# Patient Record
Sex: Female | Born: 1945 | Race: White | Hispanic: No | Marital: Married | Smoking: Former smoker
Health system: Southern US, Community
[De-identification: ages and names within clinical notes are randomized; demographics above are authoritative.]

## PROBLEM LIST (undated history)

## (undated) DIAGNOSIS — G43909 Migraine, unspecified, not intractable, without status migrainosus: Secondary | ICD-10-CM

## (undated) DIAGNOSIS — K8020 Calculus of gallbladder without cholecystitis without obstruction: Secondary | ICD-10-CM

## (undated) DIAGNOSIS — K760 Fatty (change of) liver, not elsewhere classified: Secondary | ICD-10-CM

## (undated) DIAGNOSIS — G4700 Insomnia, unspecified: Secondary | ICD-10-CM

## (undated) DIAGNOSIS — I499 Cardiac arrhythmia, unspecified: Secondary | ICD-10-CM

## (undated) DIAGNOSIS — I10 Essential (primary) hypertension: Secondary | ICD-10-CM

## (undated) DIAGNOSIS — F419 Anxiety disorder, unspecified: Secondary | ICD-10-CM

## (undated) DIAGNOSIS — K219 Gastro-esophageal reflux disease without esophagitis: Secondary | ICD-10-CM

## (undated) DIAGNOSIS — G2581 Restless legs syndrome: Secondary | ICD-10-CM

## (undated) DIAGNOSIS — M5430 Sciatica, unspecified side: Secondary | ICD-10-CM

## (undated) HISTORY — PX: INSERT / REPLACE / REMOVE PACEMAKER: SUR710

## (undated) HISTORY — PX: TUBAL LIGATION: SHX77

## (undated) HISTORY — PX: TONSILLECTOMY: SUR1361

## (undated) HISTORY — PX: COLONOSCOPY: SHX174

---

## 2013-10-15 DIAGNOSIS — I10 Essential (primary) hypertension: Secondary | ICD-10-CM | POA: Insufficient documentation

## 2013-10-15 DIAGNOSIS — E785 Hyperlipidemia, unspecified: Secondary | ICD-10-CM | POA: Insufficient documentation

## 2013-10-15 DIAGNOSIS — M79672 Pain in left foot: Secondary | ICD-10-CM | POA: Insufficient documentation

## 2013-10-15 DIAGNOSIS — Z1211 Encounter for screening for malignant neoplasm of colon: Secondary | ICD-10-CM | POA: Insufficient documentation

## 2013-10-15 DIAGNOSIS — F32A Depression, unspecified: Secondary | ICD-10-CM | POA: Insufficient documentation

## 2013-10-15 DIAGNOSIS — G43909 Migraine, unspecified, not intractable, without status migrainosus: Secondary | ICD-10-CM | POA: Insufficient documentation

## 2013-10-15 DIAGNOSIS — R1013 Epigastric pain: Secondary | ICD-10-CM | POA: Insufficient documentation

## 2013-11-03 DIAGNOSIS — R1310 Dysphagia, unspecified: Secondary | ICD-10-CM | POA: Insufficient documentation

## 2013-11-03 DIAGNOSIS — M7670 Peroneal tendinitis, unspecified leg: Secondary | ICD-10-CM | POA: Insufficient documentation

## 2014-04-13 DIAGNOSIS — M2140 Flat foot [pes planus] (acquired), unspecified foot: Secondary | ICD-10-CM | POA: Insufficient documentation

## 2014-04-13 DIAGNOSIS — M216X9 Other acquired deformities of unspecified foot: Secondary | ICD-10-CM | POA: Insufficient documentation

## 2015-02-24 DIAGNOSIS — M533 Sacrococcygeal disorders, not elsewhere classified: Secondary | ICD-10-CM | POA: Insufficient documentation

## 2015-02-24 DIAGNOSIS — M5136 Other intervertebral disc degeneration, lumbar region: Secondary | ICD-10-CM | POA: Insufficient documentation

## 2015-12-07 DIAGNOSIS — M47817 Spondylosis without myelopathy or radiculopathy, lumbosacral region: Secondary | ICD-10-CM | POA: Insufficient documentation

## 2016-12-13 ENCOUNTER — Emergency Department
Admission: EM | Admit: 2016-12-13 | Discharge: 2016-12-13 | Disposition: A | Discharge status: Home / Self Care | Payer: Medicare Other | Attending: Emergency Medicine | Admitting: Emergency Medicine

## 2016-12-13 ENCOUNTER — Encounter: Payer: Self-pay | Admitting: Emergency Medicine

## 2016-12-13 DIAGNOSIS — I10 Essential (primary) hypertension: Secondary | ICD-10-CM | POA: Diagnosis present

## 2016-12-13 DIAGNOSIS — Z79899 Other long term (current) drug therapy: Secondary | ICD-10-CM | POA: Diagnosis not present

## 2016-12-13 DIAGNOSIS — F17210 Nicotine dependence, cigarettes, uncomplicated: Secondary | ICD-10-CM | POA: Insufficient documentation

## 2016-12-13 HISTORY — DX: Essential (primary) hypertension: I10

## 2016-12-13 HISTORY — DX: Migraine, unspecified, not intractable, without status migrainosus: G43.909

## 2016-12-13 HISTORY — DX: Restless legs syndrome: G25.81

## 2016-12-13 HISTORY — DX: Sciatica, unspecified side: M54.30

## 2016-12-13 LAB — COMPREHENSIVE METABOLIC PANEL
ALBUMIN: 4.1 g/dL (ref 3.5–5.0)
ALK PHOS: 103 U/L (ref 38–126)
ALT: 22 U/L (ref 14–54)
ANION GAP: 6 (ref 5–15)
AST: 24 U/L (ref 15–41)
BILIRUBIN TOTAL: 0.7 mg/dL (ref 0.3–1.2)
BUN: 15 mg/dL (ref 6–20)
CALCIUM: 9.4 mg/dL (ref 8.9–10.3)
CO2: 29 mmol/L (ref 22–32)
Chloride: 104 mmol/L (ref 101–111)
Creatinine, Ser: 0.6 mg/dL (ref 0.44–1.00)
GFR calc non Af Amer: >60 mL/min (ref >60)
GLUCOSE: 156 mg/dL — AB (ref 65–99)
POTASSIUM: 4.1 mmol/L (ref 3.5–5.1)
SODIUM: 139 mmol/L (ref 135–145)
TOTAL PROTEIN: 7 g/dL (ref 6.5–8.1)

## 2016-12-13 LAB — CBC
HCT: 42.8 % (ref 35.0–47.0)
HEMOGLOBIN: 14.3 g/dL (ref 12.0–16.0)
MCH: 29.8 pg (ref 26.0–34.0)
MCHC: 33.3 g/dL (ref 32.0–36.0)
MCV: 89.5 fL (ref 80.0–100.0)
Platelets: 245 10*3/uL (ref 150–440)
RBC: 4.78 MIL/uL (ref 3.80–5.20)
RDW: 13.4 % (ref 11.5–14.5)
WBC: 7.5 10*3/uL (ref 3.6–11.0)

## 2016-12-13 MED ORDER — AMLODIPINE BESYLATE 5 MG PO TABS: 5.0000 mg | ORAL_TABLET | Freq: Every day | ORAL | 0 refills | Status: DC

## 2016-12-13 MED ORDER — DIAZEPAM 5 MG PO TABS: 2.5000 mg | ORAL_TABLET | Freq: Two times a day (BID) | ORAL | 0 refills | Status: DC | PRN

## 2016-12-13 NOTE — ED Provider Notes (Signed)
[facility] Emergency Department Provider Note  ____________________________________________  Time seen: Approximately 3:00 PM  I have reviewed the triage vital signs and the nursing notes.   HISTORY  Chief Complaint Hypertension    HPI Megan Irwin is a 71 y.o. female sent to the ED for evaluation due to elevated blood pressure. Reports at home is about 220/100. This is associated with some increased frequency of her migraine type headaches without any new features. She currently is symptom free. She takes only atenolol 100 mg for blood pressure control. She also used to take Valium 2.5 mg each evening for her anxiety restless leg syndrome and difficulty sleeping, and she felt like her blood pressure control was better with this until she ran out 2 weeks ago. Over these last 2 week she's been having the above mentioned symptoms, intermittent, without aggravating or alleviating factors, moderate intensity.  She has scheduled a new patient appointment with a local primary care doctor but that is not for another 3 weeks.     Past Medical History:  Diagnosis Date  . Hypertension   . Migraines   . Restless leg syndrome   . Sciatica      There are no active problems to display for this patient.    Past Surgical History:  Procedure Laterality Date  . COLONOSCOPY    . TONSILLECTOMY       Prior to Admission medications   Medication Sig Start Date End Date Taking? Authorizing Provider  amLODipine (NORVASC) 5 MG tablet Take 1 tablet (5 mg total) by mouth daily. 12/13/16   Sharman CheekStafford, Anhar Mcdermott, MD  diazepam (VALIUM) 5 MG tablet Take 0.5 tablets (2.5 mg total) by mouth every 12 (twelve) hours as needed for muscle spasms. 12/13/16   Sharman CheekStafford, Hartwell Vandiver, MD     Allergies Cardizem [diltiazem hcl] and Sulfa antibiotics   No family history on file.  Social History Social History  Substance Use Topics  . Smoking status: Current Every Day Smoker   Packs/day: 0.50    Types: Cigarettes, E-cigarettes  . Smokeless tobacco: Never Used  . Alcohol use No    Review of Systems  Constitutional:   No fever or chills.  ENT:   No sore throat. No rhinorrhea. Cardiovascular:   No chest pain or syncope. Respiratory:   No dyspnea or cough. Gastrointestinal:   Negative for abdominal pain, vomiting and diarrhea.  Musculoskeletal:   Negative for focal pain or swelling All other systems reviewed and are negative except as documented above in ROS and HPI.  ____________________________________________   PHYSICAL EXAM:  VITAL SIGNS: ED Triage Vitals  Enc Vitals Group     BP 12/13/16 1323 (!) 184/67     Pulse --      Resp 12/13/16 1323 18     Temp 12/13/16 1323 98.7 F (37.1 C)     Temp Source 12/13/16 1323 Oral     SpO2 12/13/16 1323 96 %     Weight 12/13/16 1324 130 lb (59 kg)     Height 12/13/16 1324 5\' 3"  (1.6 m)     Head Circumference --      Peak Flow --      Pain Score 12/13/16 1323 0     Pain Loc --      Pain Edu? --      Excl. in GC? --     Vital signs reviewed, nursing assessments reviewed.   Constitutional:   Alert and oriented. Well appearing and in no distress. Eyes:  No scleral icterus. No conjunctival pallor. PERRL. EOMI.  No nystagmus. ENT   Head:   Normocephalic and atraumatic.   Nose:   No congestion/rhinnorhea.    Mouth/Throat:   MMM, no pharyngeal erythema. No peritonsillar mass.    Neck:   No meningismus. Full ROM Hematological/Lymphatic/Immunilogical:   No cervical lymphadenopathy. Cardiovascular:   RRR. Symmetric bilateral radial and DP pulses.  No murmurs.  Respiratory:   Normal respiratory effort without tachypnea/retractions. Breath sounds are clear and equal bilaterally. No wheezes/rales/rhonchi. Gastrointestinal:   Soft and nontender. Non distended. There is no CVA tenderness.  No rebound, rigidity, or guarding. Genitourinary:   deferred Musculoskeletal:   Normal range of motion in all  extremities. No joint effusions.  No lower extremity tenderness.  No edema. Neurologic:   Normal speech and language.  CN 2-10 normal. Motor grossly intact. No pronator drift Normal gait No gross focal neurologic deficits are appreciated.  Skin:    Skin is warm, dry and intact. No rash noted.  No petechiae, purpura, or bullae.  ____________________________________________    LABS (pertinent positives/negatives) (all labs ordered are listed, but only abnormal results are displayed) Labs Reviewed  COMPREHENSIVE METABOLIC PANEL - Abnormal; Notable for the following:       Result Value   Glucose, Bld 156 (*)    All other components within normal limits  CBC   ____________________________________________   EKG  Interpreted by me Sinus bradycardia rate of 58, normal axis and intervals. Normal QRS ST segments and T waves. There is evidence of LVH in the high lateral leads.  ____________________________________________    RADIOLOGY  No results found.  ____________________________________________   PROCEDURES Procedures  ____________________________________________   INITIAL IMPRESSION / ASSESSMENT AND PLAN / ED COURSE  Pertinent labs & imaging results that were available during my care of the patient were reviewed by me and considered in my medical decision making (see chart for details). Patient well appearing no acute distress. Presents with elevated blood pressure, likely chronic. She's been having intermittent headaches that are entirely typical of her migraine type headaches and resolved with oral magnesium and are characterized only by blurry vision. She denies any pain in the head. She denies any peripheral numbness tingling or weakness. No dizziness or syncope. No fevers chills or neck stiffness.Considering the patient's symptoms, medical history, and physical examination today, I have low suspicion for ischemic stroke, intracranial hemorrhage, meningitis,  encephalitis, carotid or vertebral dissection, venous sinus thrombosis, MS, intracranial hypertension, glaucoma, CRAO, CRVO, or temporal arteritis. There may be some anxiety component as well as she previously took low-dose Valium and that seemed to help a lot with her headaches and blood pressure. I will refill the low-dose Valium with a very short course as well as start her on a new blood pressure medicine for improved blood pressure control. She has a blood pressure cuff at home and will continue to monitor. She's been given return precautions as well as guidelines of being seen immediately if her blood pressure goes to low. She'll watch her symptoms for any new concerns as well.      ____________________________________________   FINAL CLINICAL IMPRESSION(S) / ED DIAGNOSES  Final diagnoses:  Essential hypertension      New Prescriptions   AMLODIPINE (NORVASC) 5 MG TABLET    Take 1 tablet (5 mg total) by mouth daily.   DIAZEPAM (VALIUM) 5 MG TABLET    Take 0.5 tablets (2.5 mg total) by mouth every 12 (twelve) hours as needed for muscle spasms.  Portions of this note were generated with dragon dictation software. Dictation errors may occur despite best attempts at proofreading.    Sharman Cheek, MD 12/13/16 [phone]

## 2016-12-13 NOTE — ED Triage Notes (Signed)
Patient presents to the ED from her doctor's office for high blood pressure.  Patient's blood pressure was 204/84 at her doctor's office.  Patient states over the past two weeks she has had an increased frequency in headaches/migraines and blurry vision.  Patient states she is not having any headache or blurry vision at this time.  Patient denies dizziness or chest pain or shortness of breath.  Patient states she takes 100 mg of atenolol daily.  Patient states she has run out of valium and that seems to have coincided with the headaches starting.

## 2016-12-13 NOTE — ED Notes (Signed)
NAD noted at time of D/C. Pt denies questions or concerns. Pt ambulatory to the lobby at this time.  

## 2017-01-02 DIAGNOSIS — J309 Allergic rhinitis, unspecified: Secondary | ICD-10-CM | POA: Insufficient documentation

## 2017-01-02 DIAGNOSIS — G4700 Insomnia, unspecified: Secondary | ICD-10-CM | POA: Insufficient documentation

## 2017-01-02 DIAGNOSIS — M810 Age-related osteoporosis without current pathological fracture: Secondary | ICD-10-CM | POA: Insufficient documentation

## 2017-01-02 DIAGNOSIS — K219 Gastro-esophageal reflux disease without esophagitis: Secondary | ICD-10-CM | POA: Insufficient documentation

## 2017-01-09 DIAGNOSIS — Z8659 Personal history of other mental and behavioral disorders: Secondary | ICD-10-CM | POA: Insufficient documentation

## 2017-07-17 DIAGNOSIS — M5430 Sciatica, unspecified side: Secondary | ICD-10-CM

## 2017-07-17 DIAGNOSIS — I499 Cardiac arrhythmia, unspecified: Secondary | ICD-10-CM

## 2017-07-17 HISTORY — DX: Cardiac arrhythmia, unspecified: I49.9

## 2017-07-17 HISTORY — DX: Sciatica, unspecified side: M54.30

## 2017-08-30 ENCOUNTER — Encounter: Payer: Self-pay | Admitting: Emergency Medicine

## 2017-08-30 ENCOUNTER — Ambulatory Visit
Admission: EM | Admit: 2017-08-30 | Discharge: 2017-08-30 | Disposition: A | Discharge status: Home / Self Care | Payer: Medicare Other | Attending: Family Medicine | Admitting: Family Medicine

## 2017-08-30 ENCOUNTER — Other Ambulatory Visit: Payer: Self-pay

## 2017-08-30 DIAGNOSIS — F17210 Nicotine dependence, cigarettes, uncomplicated: Secondary | ICD-10-CM | POA: Insufficient documentation

## 2017-08-30 DIAGNOSIS — Z79899 Other long term (current) drug therapy: Secondary | ICD-10-CM | POA: Insufficient documentation

## 2017-08-30 DIAGNOSIS — R1011 Right upper quadrant pain: Secondary | ICD-10-CM

## 2017-08-30 DIAGNOSIS — I10 Essential (primary) hypertension: Secondary | ICD-10-CM | POA: Insufficient documentation

## 2017-08-30 DIAGNOSIS — G2581 Restless legs syndrome: Secondary | ICD-10-CM | POA: Diagnosis not present

## 2017-08-30 DIAGNOSIS — Z888 Allergy status to other drugs, medicaments and biological substances status: Secondary | ICD-10-CM | POA: Insufficient documentation

## 2017-08-30 DIAGNOSIS — Z8249 Family history of ischemic heart disease and other diseases of the circulatory system: Secondary | ICD-10-CM | POA: Insufficient documentation

## 2017-08-30 DIAGNOSIS — Z882 Allergy status to sulfonamides status: Secondary | ICD-10-CM | POA: Insufficient documentation

## 2017-08-30 LAB — COMPREHENSIVE METABOLIC PANEL
ALK PHOS: 117 U/L (ref 38–126)
ALT: 22 U/L (ref 14–54)
AST: 22 U/L (ref 15–41)
Albumin: 4.4 g/dL (ref 3.5–5.0)
Anion gap: 8 (ref 5–15)
BILIRUBIN TOTAL: 0.7 mg/dL (ref 0.3–1.2)
BUN: 14 mg/dL (ref 6–20)
CALCIUM: 9.3 mg/dL (ref 8.9–10.3)
CO2: 27 mmol/L (ref 22–32)
Chloride: 103 mmol/L (ref 101–111)
Creatinine, Ser: 0.7 mg/dL (ref 0.44–1.00)
GFR calc Af Amer: >60 mL/min (ref >60)
Glucose, Bld: 109 mg/dL — ABNORMAL HIGH (ref 65–99)
Potassium: 4.3 mmol/L (ref 3.5–5.1)
Sodium: 138 mmol/L (ref 135–145)
TOTAL PROTEIN: 7.5 g/dL (ref 6.5–8.1)

## 2017-08-30 LAB — CBC WITH DIFFERENTIAL/PLATELET
BASOS ABS: 0.1 10*3/uL (ref 0–0.1)
Basophils Relative: 1 %
Eosinophils Absolute: 0.1 10*3/uL (ref 0–0.7)
Eosinophils Relative: 1 %
HEMATOCRIT: 43.9 % (ref 35.0–47.0)
HEMOGLOBIN: 14.9 g/dL (ref 12.0–16.0)
LYMPHS PCT: 20 %
Lymphs Abs: 1.8 10*3/uL (ref 1.0–3.6)
MCH: 30.1 pg (ref 26.0–34.0)
MCHC: 33.9 g/dL (ref 32.0–36.0)
MCV: 88.7 fL (ref 80.0–100.0)
MONO ABS: 0.5 10*3/uL (ref 0.2–0.9)
MONOS PCT: 5 %
Neutro Abs: 6.5 10*3/uL (ref 1.4–6.5)
Neutrophils Relative %: 73 %
Platelets: 233 10*3/uL (ref 150–440)
RBC: 4.95 MIL/uL (ref 3.80–5.20)
RDW: 13.2 % (ref 11.5–14.5)
WBC: 8.9 10*3/uL (ref 3.6–11.0)

## 2017-08-30 LAB — URINALYSIS, COMPLETE (UACMP) WITH MICROSCOPIC
BACTERIA UA: NONE SEEN
Bilirubin Urine: NEGATIVE
GLUCOSE, UA: NEGATIVE mg/dL
Ketones, ur: NEGATIVE mg/dL
Leukocytes, UA: NEGATIVE
Nitrite: NEGATIVE
PH: 6 (ref 5.0–8.0)
Protein, ur: NEGATIVE mg/dL
WBC, UA: NONE SEEN WBC/hpf (ref 0–5)

## 2017-08-30 LAB — LIPASE, BLOOD: LIPASE: 24 U/L (ref 11–51)

## 2017-08-30 MED ORDER — TRAMADOL HCL 50 MG PO TABS: 50.0000 mg | ORAL_TABLET | Freq: Three times a day (TID) | ORAL | 0 refills | Status: DC | PRN

## 2017-08-30 NOTE — Discharge Instructions (Signed)
Use the medication as needed.  We will call about the US results.  Take care  Dr. Adriana Simasook

## 2017-08-30 NOTE — ED Notes (Signed)
US scheduled for tomorrow at 10:30am at [facility].

## 2017-08-30 NOTE — ED Provider Notes (Signed)
[facility]  CSN: [id number] Arrival date & time: 08/30/17  1601  History   Chief Complaint Chief Complaint  Patient presents with  . Abdominal Pain    RUQ   HPI  72 year old female presents with right upper quadrant pain.  Patient reports that she had one brief episode of right upper quadrant pain 1 week ago.  Subsided quickly.  She states that it recurred again today.  Moderate to severe.  Currently 3-4/10 in severity.  No known inciting factor.  No known exacerbating or relieving factors.  No relation to food.  No nausea or vomiting.  No diarrhea.  No urinary symptoms.  No other associated symptoms.  No other complaints or concerns at this time.  Past Medical History:  Diagnosis Date  . Hypertension   . Migraines   . Restless leg syndrome   . Sciatica    Past Surgical History:  Procedure Laterality Date  . COLONOSCOPY    . TONSILLECTOMY    Tubal ligation  OB History    No data available     Home Medications    Prior to Admission medications   Medication Sig Start Date End Date Taking? Authorizing Provider  atenolol (TENORMIN) 100 MG tablet Take 100 mg by mouth daily.   Yes [provider]  diazepam (VALIUM) 5 MG tablet Take 0.5 tablets (2.5 mg total) by mouth every 12 (twelve) hours as needed for muscle spasms. 12/13/16  Yes Sharman CheekStafford, Phillip, MD  valsartan (DIOVAN) 160 MG tablet Take 160 mg by mouth daily.   Yes [provider]  traMADol (ULTRAM) 50 MG tablet Take 1 tablet (50 mg total) by mouth every 8 (eight) hours as needed. 08/30/17   Tommie Samsook, Vestal Crandall G, DO   Family History Coronary Artery Disease (Blocked arteries around heart) Father    High blood pressure (Hypertension) Father    High blood pressure (Hypertension) Mother    High blood pressure (Hypertension) Sister    Skin cancer Son     Social History Social History   Tobacco Use  . Smoking status: Current Every Day Smoker    Packs/day: 0.50    Types: Cigarettes,  E-cigarettes  . Smokeless tobacco: Never Used  Substance Use Topics  . Alcohol use: No  . Drug use: No   Allergies   Calcium channel blockers; Cardizem [diltiazem hcl]; and Sulfa antibiotics   Review of Systems Review of Systems  Constitutional: Negative.   Gastrointestinal: Positive for abdominal pain. Negative for diarrhea, nausea and vomiting.   Physical Exam Triage Vital Signs ED Triage Vitals  Enc Vitals Group     BP 08/30/17 1623 (S) (!) 197/84     Pulse Rate 08/30/17 1623 69     Resp 08/30/17 1623 16     Temp 08/30/17 1623 98.2 F (36.8 C)     Temp Source 08/30/17 1623 Oral     SpO2 08/30/17 1623 100 %     Weight 08/30/17 1619 128 lb (58.1 kg)     Height 08/30/17 1619 5\' 3"  (1.6 m)     Head Circumference --      Peak Flow --      Pain Score 08/30/17 1619 4     Pain Loc --      Pain Edu? --      Excl. in GC? --    Updated Vital Signs BP (S) (!) 197/84 (BP Location: Left Arm)   Pulse 69   Temp 98.2 F (36.8 C) (Oral)   Resp 16  Ht 5\' 3"  (1.6 m)   Wt 128 lb (58.1 kg)   SpO2 100%   BMI 22.67 kg/m   Physical Exam  Constitutional: She is oriented to person, place, and time. She appears well-developed. No distress.  HENT:  Head: Normocephalic and atraumatic.  Nose: Nose normal.  Cardiovascular: Normal rate and regular rhythm.  No murmur heard. Pulmonary/Chest: Effort normal and breath sounds normal. She has no wheezes. She has no rales.  Abdominal: Soft. She exhibits no distension.  No discrete right upper quadrant tenderness although this is where she endorses pain.  Neurological: She is alert and oriented to person, place, and time.  Psychiatric: She has a normal mood and affect. Her behavior is normal.  Nursing note and vitals reviewed.  UC Treatments / Results  Labs (all labs ordered are listed, but only abnormal results are displayed) Labs Reviewed  URINALYSIS, COMPLETE (UACMP) WITH MICROSCOPIC - Abnormal; Notable for the following components:       Result Value   Color, Urine STRAW (*)    Specific Gravity, Urine <1.005 (*)    Hgb urine dipstick TRACE (*)    Squamous Epithelial / LPF 0-5 (*)    All other components within normal limits  COMPREHENSIVE METABOLIC PANEL - Abnormal; Notable for the following components:   Glucose, Bld 109 (*)    All other components within normal limits  CBC WITH DIFFERENTIAL/PLATELET  LIPASE, BLOOD    EKG  EKG Interpretation None       Radiology No results found.  Procedures Procedures (including critical care time)  Medications Ordered in UC Medications - No data to display   Initial Impression / Assessment and Plan / UC Course  I have reviewed the triage vital signs and the nursing notes.  Pertinent labs & imaging results that were available during my care of the patient were reviewed by me and considered in my medical decision making (see chart for details).    72 year old female presents with right upper quadrant pain.  Concern for those gallbladder no/biliary tract disease.  Labs unremarkable today.  Sending for ultrasound.  Tramadol for pain.  Final Clinical Impressions(s) / UC Diagnoses   Final diagnoses:  RUQ pain    ED Discharge Orders        Ordered    US Abdomen Limited RUQ    Comments:  Specimen [id number]: [phone] Hold Patient    08/30/17 1651    traMADol (ULTRAM) 50 MG tablet  Every 8 hours PRN     08/30/17 1716     Controlled Substance Prescriptions [place] Controlled Substance Registry consulted? Not Applicable   Tommie Sams, DO 08/30/17 1757

## 2017-08-30 NOTE — ED Triage Notes (Signed)
Patient c/o RUQ abdominal pain that started a week ago but has gotten worse.  Patient denies N/V/D.

## 2017-08-31 ENCOUNTER — Telehealth: Payer: Self-pay | Admitting: Emergency Medicine

## 2017-08-31 ENCOUNTER — Ambulatory Visit
Admission: RE | Admit: 2017-08-31 | Discharge: 2017-08-31 | Disposition: A | Discharge status: Home / Self Care | Payer: Medicare Other | Source: Ambulatory Visit | Attending: Family Medicine | Admitting: Family Medicine

## 2017-08-31 DIAGNOSIS — R1011 Right upper quadrant pain: Secondary | ICD-10-CM | POA: Diagnosis present

## 2017-08-31 DIAGNOSIS — K760 Fatty (change of) liver, not elsewhere classified: Secondary | ICD-10-CM | POA: Insufficient documentation

## 2017-08-31 DIAGNOSIS — K8020 Calculus of gallbladder without cholecystitis without obstruction: Secondary | ICD-10-CM | POA: Diagnosis not present

## 2017-08-31 NOTE — Telephone Encounter (Signed)
Received call report from [facility] re: gallbladder ultrasound results. Ultrasound was + for gallstones, but no evidence of cholecystitis. Dr. Adriana Simasook notified of results via telephone.

## 2017-09-02 NOTE — Telephone Encounter (Signed)
Please notify of gallstones on US. If pain persists, recommend seeing General Surgery.

## 2017-09-03 ENCOUNTER — Telehealth: Payer: Self-pay | Admitting: Emergency Medicine

## 2017-09-03 NOTE — Telephone Encounter (Signed)
Patient called requesting results of US from last Friday. Notified patient of gallstones on US. Follow up with general surgery if pain persists per Dr. Adriana Simasook. Patient agreed and voiced understanding.

## 2017-09-08 DIAGNOSIS — G2581 Restless legs syndrome: Secondary | ICD-10-CM | POA: Insufficient documentation

## 2017-09-08 DIAGNOSIS — K760 Fatty (change of) liver, not elsewhere classified: Secondary | ICD-10-CM | POA: Insufficient documentation

## 2017-09-14 DIAGNOSIS — K8020 Calculus of gallbladder without cholecystitis without obstruction: Secondary | ICD-10-CM

## 2017-09-14 HISTORY — DX: Calculus of gallbladder without cholecystitis without obstruction: K80.20

## 2017-09-25 ENCOUNTER — Ambulatory Visit: Payer: Self-pay | Admitting: General Surgery

## 2017-09-25 NOTE — H&P (View-Only) (Signed)
PATIENT PROFILE: Megan Irwin is a 72 y.o. female who presents to the Clinic for consultation at the request of Dr. Bobette Mo for evaluation of cholelithiasis.  PCP: Gearldine Shown, DO  HISTORY OF PRESENT ILLNESS: [name] reports having abdominal pain since 4-5 weeks ago. The patient refers that she has had 2 abdominal pain episodes. The pain is localized on the right upper quadrant and does not radiates to other part of the abdomen or back. Pain is improved was improved with Tramadol and cannot recall anything that activates the pain. Patient cannot associated pain with food intake. The first episode started at night while sleeping and lasted about a day. The second episode was 2 days ago and it was mild. Patient refers having regular bowel movements (constipation every once in a while). Denies associated nausea and vomiting. Denies fever or chills.   PROBLEM LIST: Problem List Date Reviewed: 09/08/2017  Noted  Non-alcoholic fatty liver disease 09/08/2017  Restless legs syndrome (RLS) 09/08/2017  Recurrent major depressive episodes, in full remission (CMS-HCC) 01/09/2017  Allergic rhinitis 01/02/2017  Overview  Overview:  Updated invalid ICD 9 codes for ICD 10 go live   Esophageal reflux 01/02/2017  Insomnia 01/02/2017  Osteoporosis 01/02/2017  Lumbosacral spondylosis without myelopathy 12/07/2015  Disc degeneration, lumbar 02/24/2015  Sacroiliac pain 02/24/2015  Acquired pronation of foot 04/13/2014  Pes planus 04/13/2014  Overview  Overview:  Updated invalid ICD 9 codes for ICD 10 go live   Dysphagia, unspecified 11/03/2013  Peroneal tendonitis 11/03/2013  Essential hypertension 10/15/2013  Overview  Overview:  Updated invalid ICD 9 codes for ICD 10 go live   HLD (hyperlipidemia), unspecified 10/15/2013  Migraine 10/15/2013  Overview  Overview:  Updated invalid ICD 9 codes for ICD 10 go live     GENERAL REVIEW OF SYSTEMS:   General ROS: negative for -  chills, fatigue, fever, weight gain or weight loss Allergy and Immunology ROS: negative for - hives  Hematological and Lymphatic ROS: negative for - bleeding problems or bruising, negative for palpable nodes Endocrine ROS: negative for - heat or cold intolerance, hair changes Respiratory ROS: negative for - cough, shortness of breath or wheezing Cardiovascular ROS: no chest pain or palpitations GI ROS: See HPI Musculoskeletal ROS: negative for - joint swelling or muscle pain Neurological ROS: negative for - confusion, syncope Dermatological ROS: negative for pruritus and rash Psychiatric: negative for anxiety, depression, difficulty sleeping and memory loss  MEDICATIONS: Current Outpatient Medications  Medication Sig Dispense Refill  . atenolol (TENORMIN) 100 MG tablet Take 1 tablet (100 mg total) by mouth once daily. 30 tablet 11  . atorvastatin (LIPITOR) 10 MG tablet Take 5 mg by mouth once daily  . cyanocobalamin, vitamin B-12, (VITAMIN B-12 ORAL) Take by mouth.  . diazePAM (VALIUM) 2 MG tablet Take 1 tablet (2 mg total) by mouth nightly as needed for Sleep 30 tablet 3  . EPINEPHrine (EPIPEN) 0.3 mg/0.3 mL pen injector Inject into the muscle.  . fluticasone (FLONASE) 50 mcg/actuation nasal spray Place 1 spray into both nostrils once daily. 16 g 11  . valsartan (DIOVAN) 160 MG tablet Take 1 tablet (160 mg total) by mouth once daily. 30 tablet 11   No current facility-administered medications for this visit.   ALLERGIES: Other; Resver-wine-bfl-grpsd-pc-c-grp; Spironolactone; Statins-hmg-coa reductase inhibitors; Sulfa (sulfonamide antibiotics); Amlodipine; and Cardizem [diltiazem hcl]  PAST MEDICAL HISTORY: Past Medical History:  Diagnosis Date  . Hypertension   PAST SURGICAL HISTORY: Past Surgical History:  Procedure Laterality Date  .  COLONOSCOPY 12/23/2013  . tear duct  . TONSILLECTOMY  . TUBAL LIGATION    FAMILY HISTORY: Family History  Problem Relation Age of Onset   . High blood pressure (Hypertension) Mother  . Coronary Artery Disease (Blocked arteries around heart) Father  . High blood pressure (Hypertension) Father  . High blood pressure (Hypertension) Sister  . Skin cancer Son    SOCIAL HISTORY: Social History   Socioeconomic History  . Marital status: Married  Spouse name: Not on file  . Number of children: Not on file  . Years of education: Not on file  . Highest education level: Not on file  Occupational History  . Not on file  Social Needs  . Financial resource strain: Not on file  . Food insecurity:  Worry: Not on file  Inability: Not on file  . Transportation needs:  Medical: Not on file  Non-medical: Not on file  Tobacco Use  . Smoking status: Current Every Day Smoker  . Smokeless tobacco: Never Used  Substance and Sexual Activity  . Alcohol use: No  Alcohol/week: 0.0 oz  . Drug use: No  . Sexual activity: Not Currently  Lifestyle  . Physical activity:  Days per week: Not on file  Minutes per session: Not on file  . Stress: Not on file  Relationships  . Social connections:  Talks on phone: Not on file  Gets together: Not on file  Attends religious service: Not on file  Active member of club or organization: Not on file  Attends meetings of clubs or organizations: Not on file  Relationship status: Not on file  . Intimate partner violence:  Fear of current or ex partner: Not on file  Emotionally abused: Not on file  Physically abused: Not on file  Forced sexual activity: Not on file  Other Topics Concern  . Not on file  Social History Narrative  . Not on file   PHYSICAL EXAM: Estimated body mass index is 22.54 kg/m as calculated from the following: Height as of this encounter: 160 cm (5' 2.99"). Weight as of this encounter: 57.7 kg (127 lb 3.2 oz). Vitals:  09/18/17 1035  BP: (!) 188/90  Pulse: 62  Temp: 36.3 C (97.3 F)   GENERAL: Alert, active, oriented x3  HEENT: Pupils equal reactive to light.  Extraocular movements are intact. Sclera clear. Palpebral conjunctiva normal red color.Pharynx clear.  NECK: Supple with no palpable mass and no adenopathy.  LUNGS: Sound clear with no rales rhonchi or wheezes.  HEART: Regular rhythm S1 and S2 without murmur.  ABDOMEN: Soft and depressible, nontender with no palpable mass, no hepatomegaly.   EXTREMITIES: Well-developed well-nourished symmetrical with no dependent edema.  NEUROLOGICAL: Awake alert oriented, facial expression symmetrical, moving all extremities.  REVIEW OF DATA: I have reviewed the following data today: Appointment on 07/18/2017  Component Date Value  . Cholesterol, Total 07/18/2017 192  . LDL (Low Density Lipopro* 07/18/2017 103  . HDL (High Density Lipopr* 07/18/2017 52  . Triglyceride 07/18/2017 183  . Protein, Total 07/18/2017 6.3  . Albumin 07/18/2017 3.7  . Bilirubin, Total 07/18/2017 0.6  . Bilirubin, Conjugated 07/18/2017 0.1  . Alk Phos (Alkaline Phosp* 07/18/2017 148*  . AST (Aspartate Aminotran* 07/18/2017 23  . ALT (Alanine Aminotransf* 07/18/2017 30   EXAM: ULTRASOUND ABDOMEN LIMITED RIGHT UPPER QUADRANT  COMPARISON:None.  FINDINGS: Gallbladder:  There layering gallstones in the fundus of the gallbladder the largest of which measures 7 mm. No evidence of abnormal distention of the gallbladder or  gallbladder wall thickening. Gallbladder wall measures 2.3 mm in thickness. The sonographic Murphy's sign was reported as negative.  Common bile duct:  Diameter: 5.9 mm  Liver:  No focal lesion identified. Diffusely increased parenchymal echogenicity. Portal vein is patent on color Doppler imaging with normal direction of blood flow towards the liver.  IMPRESSION: Cholelithiasis without evidence of acute cholecystitis.  Diffusely increased parenchymal echogenicity of the liver usually associated with hepatic steatosis.  Electronically Signed By: DobrinkaDimitrova M.D. On:  08/31/2017 10:43  ASSESSMENT: Ms. Milford is a 72 y.o. female presenting for consultation for cholelithiasis.  Patient was oriented about the diagnosis of cholelithiasis and its implication. Patient was oriented about the gallbladder, its anatomy and function and the treatment alternatives (observation vs surgical treatment). Also oriented about the surgical technique, its benefits, the goals of the surgery and its risks.  Patient refers that she wants to avoid surgery if possible. Patient was oriented that the right upper quadrant pain with the sonogram showing the gallbladder stones, the most common cause of the pain is the biliary colic. Patient was oriented about the risk of not having surgery, the worst is developing cholecystitis. Also discussed that if she continue having pain, she should have surgery to do it on an elective basis and avoid doing urgent cholecystectomy due to cholecystitis.   PLAN: 1. Laparoscopic cholecystectomy 2. Avoid aspirin 5 days before surgery  3. CBC, CMP, Internal Medicine clearance 4. Avoid fatty food if possible 5. If develops fever, acute abdominal pain, nausea and/or vomiting, needs to go to ER for evaluation.   Patient and her husband verbalized understanding, all questions were answered, and were agreeable with the plan outlined above.   Herbert Pun, MD  Electronically signed by Herbert Pun, MD

## 2017-09-25 NOTE — H&P (Signed)
PATIENT PROFILE: Megan Irwin is a 72 y.o. female who presents to the Clinic for consultation at the request of Dr. Bobette Mo for evaluation of cholelithiasis.  PCP: Gearldine Shown, DO  HISTORY OF PRESENT ILLNESS: Megan Irwin reports having abdominal pain since 4-5 weeks ago. The patient refers that she has had 2 abdominal pain episodes. The pain is localized on the right upper quadrant and does not radiates to other part of the abdomen or back. Pain is improved was improved with Tramadol and cannot recall anything that activates the pain. Patient cannot associated pain with food intake. The first episode started at night while sleeping and lasted about a day. The second episode was 2 days ago and it was mild. Patient refers having regular bowel movements (constipation every once in a while). Denies associated nausea and vomiting. Denies fever or chills.   PROBLEM LIST: Problem List Date Reviewed: 09/08/2017  Noted  Non-alcoholic fatty liver disease 09/08/2017  Restless legs syndrome (RLS) 09/08/2017  Recurrent major depressive episodes, in full remission (CMS-HCC) 01/09/2017  Allergic rhinitis 01/02/2017  Overview  Overview:  Updated invalid ICD 9 codes for ICD 10 go live   Esophageal reflux 01/02/2017  Insomnia 01/02/2017  Osteoporosis 01/02/2017  Lumbosacral spondylosis without myelopathy 12/07/2015  Disc degeneration, lumbar 02/24/2015  Sacroiliac pain 02/24/2015  Acquired pronation of foot 04/13/2014  Pes planus 04/13/2014  Overview  Overview:  Updated invalid ICD 9 codes for ICD 10 go live   Dysphagia, unspecified 11/03/2013  Peroneal tendonitis 11/03/2013  Essential hypertension 10/15/2013  Overview  Overview:  Updated invalid ICD 9 codes for ICD 10 go live   HLD (hyperlipidemia), unspecified 10/15/2013  Migraine 10/15/2013  Overview  Overview:  Updated invalid ICD 9 codes for ICD 10 go live     GENERAL REVIEW OF SYSTEMS:   General ROS: negative for -  chills, fatigue, fever, weight gain or weight loss Allergy and Immunology ROS: negative for - hives  Hematological and Lymphatic ROS: negative for - bleeding problems or bruising, negative for palpable nodes Endocrine ROS: negative for - heat or cold intolerance, hair changes Respiratory ROS: negative for - cough, shortness of breath or wheezing Cardiovascular ROS: no chest pain or palpitations GI ROS: See HPI Musculoskeletal ROS: negative for - joint swelling or muscle pain Neurological ROS: negative for - confusion, syncope Dermatological ROS: negative for pruritus and rash Psychiatric: negative for anxiety, depression, difficulty sleeping and memory loss  MEDICATIONS: Current Outpatient Medications  Medication Sig Dispense Refill  . atenolol (TENORMIN) 100 MG tablet Take 1 tablet (100 mg total) by mouth once daily. 30 tablet 11  . atorvastatin (LIPITOR) 10 MG tablet Take 5 mg by mouth once daily  . cyanocobalamin, vitamin B-12, (VITAMIN B-12 ORAL) Take by mouth.  . diazePAM (VALIUM) 2 MG tablet Take 1 tablet (2 mg total) by mouth nightly as needed for Sleep 30 tablet 3  . EPINEPHrine (EPIPEN) 0.3 mg/0.3 mL pen injector Inject into the muscle.  . fluticasone (FLONASE) 50 mcg/actuation nasal spray Place 1 spray into both nostrils once daily. 16 g 11  . valsartan (DIOVAN) 160 MG tablet Take 1 tablet (160 mg total) by mouth once daily. 30 tablet 11   No current facility-administered medications for this visit.   ALLERGIES: Other; Resver-wine-bfl-grpsd-pc-c-grp; Spironolactone; Statins-hmg-coa reductase inhibitors; Sulfa (sulfonamide antibiotics); Amlodipine; and Cardizem [diltiazem hcl]  PAST MEDICAL HISTORY: Past Medical History:  Diagnosis Date  . Hypertension   PAST SURGICAL HISTORY: Past Surgical History:  Procedure Laterality Date  .  COLONOSCOPY 12/23/2013  . tear duct  . TONSILLECTOMY  . TUBAL LIGATION    FAMILY HISTORY: Family History  Problem Relation Age of Onset   . High blood pressure (Hypertension) Mother  . Coronary Artery Disease (Blocked arteries around heart) Father  . High blood pressure (Hypertension) Father  . High blood pressure (Hypertension) Sister  . Skin cancer Son    SOCIAL HISTORY: Social History   Socioeconomic History  . Marital status: Married  Spouse name: Not on file  . Number of children: Not on file  . Years of education: Not on file  . Highest education level: Not on file  Occupational History  . Not on file  Social Needs  . Financial resource strain: Not on file  . Food insecurity:  Worry: Not on file  Inability: Not on file  . Transportation needs:  Medical: Not on file  Non-medical: Not on file  Tobacco Use  . Smoking status: Current Every Day Smoker  . Smokeless tobacco: Never Used  Substance and Sexual Activity  . Alcohol use: No  Alcohol/week: 0.0 oz  . Drug use: No  . Sexual activity: Not Currently  Lifestyle  . Physical activity:  Days per week: Not on file  Minutes per session: Not on file  . Stress: Not on file  Relationships  . Social connections:  Talks on phone: Not on file  Gets together: Not on file  Attends religious service: Not on file  Active member of club or organization: Not on file  Attends meetings of clubs or organizations: Not on file  Relationship status: Not on file  . Intimate partner violence:  Fear of current or ex partner: Not on file  Emotionally abused: Not on file  Physically abused: Not on file  Forced sexual activity: Not on file  Other Topics Concern  . Not on file  Social History Narrative  . Not on file   PHYSICAL EXAM: Estimated body mass index is 22.54 kg/m as calculated from the following: Height as of this encounter: 160 cm (5' 2.99"). Weight as of this encounter: 57.7 kg (127 lb 3.2 oz). Vitals:  09/18/17 1035  BP: (!) 188/90  Pulse: 62  Temp: 36.3 C (97.3 F)   GENERAL: Alert, active, oriented x3  HEENT: Pupils equal reactive to light.  Extraocular movements are intact. Sclera clear. Palpebral conjunctiva normal red color.Pharynx clear.  NECK: Supple with no palpable mass and no adenopathy.  LUNGS: Sound clear with no rales rhonchi or wheezes.  HEART: Regular rhythm S1 and S2 without murmur.  ABDOMEN: Soft and depressible, nontender with no palpable mass, no hepatomegaly.   EXTREMITIES: Well-developed well-nourished symmetrical with no dependent edema.  NEUROLOGICAL: Awake alert oriented, facial expression symmetrical, moving all extremities.  REVIEW OF DATA: I have reviewed the following data today: Appointment on 07/18/2017  Component Date Value  . Cholesterol, Total 07/18/2017 192  . LDL (Low Density Lipopro* 07/18/2017 103  . HDL (High Density Lipopr* 07/18/2017 52  . Triglyceride 07/18/2017 183  . Protein, Total 07/18/2017 6.3  . Albumin 07/18/2017 3.7  . Bilirubin, Total 07/18/2017 0.6  . Bilirubin, Conjugated 07/18/2017 0.1  . Alk Phos (Alkaline Phosp* 07/18/2017 148*  . AST (Aspartate Aminotran* 07/18/2017 23  . ALT (Alanine Aminotransf* 07/18/2017 30   EXAM: ULTRASOUND ABDOMEN LIMITED RIGHT UPPER QUADRANT  COMPARISON:None.  FINDINGS: Gallbladder:  There layering gallstones in the fundus of the gallbladder the largest of which measures 7 mm. No evidence of abnormal distention of the gallbladder or  gallbladder wall thickening. Gallbladder wall measures 2.3 mm in thickness. The sonographic Murphy's sign was reported as negative.  Common bile duct:  Diameter: 5.9 mm  Liver:  No focal lesion identified. Diffusely increased parenchymal echogenicity. Portal vein is patent on color Doppler imaging with normal direction of blood flow towards the liver.  IMPRESSION: Cholelithiasis without evidence of acute cholecystitis.  Diffusely increased parenchymal echogenicity of the liver usually associated with hepatic steatosis.  Electronically Signed By: DobrinkaDimitrova M.D. On:  08/31/2017 10:43  ASSESSMENT: Ms. Milford is a 72 y.o. female presenting for consultation for cholelithiasis.  Patient was oriented about the diagnosis of cholelithiasis and its implication. Patient was oriented about the gallbladder, its anatomy and function and the treatment alternatives (observation vs surgical treatment). Also oriented about the surgical technique, its benefits, the goals of the surgery and its risks.  Patient refers that she wants to avoid surgery if possible. Patient was oriented that the right upper quadrant pain with the sonogram showing the gallbladder stones, the most common cause of the pain is the biliary colic. Patient was oriented about the risk of not having surgery, the worst is developing cholecystitis. Also discussed that if she continue having pain, she should have surgery to do it on an elective basis and avoid doing urgent cholecystectomy due to cholecystitis.   PLAN: 1. Laparoscopic cholecystectomy 2. Avoid aspirin 5 days before surgery  3. CBC, CMP, Internal Medicine clearance 4. Avoid fatty food if possible 5. If develops fever, acute abdominal pain, nausea and/or vomiting, needs to go to ER for evaluation.   Patient and her husband verbalized understanding, all questions were answered, and were agreeable with the plan outlined above.   Herbert Pun, MD  Electronically signed by Herbert Pun, MD

## 2017-09-27 ENCOUNTER — Other Ambulatory Visit: Payer: Self-pay

## 2017-09-27 ENCOUNTER — Encounter
Admission: RE | Admit: 2017-09-27 | Discharge: 2017-09-27 | Disposition: A | Discharge status: Home / Self Care | Payer: Medicare Other | Source: Ambulatory Visit | Attending: General Surgery | Admitting: General Surgery

## 2017-09-27 DIAGNOSIS — Z01810 Encounter for preprocedural cardiovascular examination: Secondary | ICD-10-CM | POA: Diagnosis not present

## 2017-09-27 DIAGNOSIS — I10 Essential (primary) hypertension: Secondary | ICD-10-CM | POA: Insufficient documentation

## 2017-09-27 HISTORY — DX: Anxiety disorder, unspecified: F41.9

## 2017-09-27 HISTORY — DX: Gastro-esophageal reflux disease without esophagitis: K21.9

## 2017-09-27 HISTORY — DX: Calculus of gallbladder without cholecystitis without obstruction: K80.20

## 2017-09-27 HISTORY — DX: Cardiac arrhythmia, unspecified: I49.9

## 2017-09-27 HISTORY — DX: Insomnia, unspecified: G47.00

## 2017-09-27 HISTORY — DX: Fatty (change of) liver, not elsewhere classified: K76.0

## 2017-09-27 NOTE — Patient Instructions (Signed)
Your procedure is scheduled on: Wednesday, MARCH 20  Report to THE SECOND FLOOR OF THE MEDICAL MALL  To find out your arrival time please call [phone] between          1PM - 3PM on Tuesday, MARCH 19   Remember: Instructions that are not followed completely may result in serious  medical risk, up to and including death, or upon the discretion of your surgeon  and anesthesiologist your surgery may need to be rescheduled.     _X__ 1. Do not eat food after midnight the night before your procedure.                 No gum chewing, lozengers or hard candies.                   You may drink clear liquids up to 2 hours                 before you are scheduled to arrive for your surgery- DO not drink clear                 liquids within 2 hours of the start of your surgery.                  Clear Liquids include:  water, apple juice without pulp, clear carbohydrate                 drink such as Clearfast of Gatorade, Heino Coffee or Tea (Do not add                 anything to coffee or tea).  __X__2.  On the morning of surgery brush your teeth with toothpaste and water,                       you may rinse your mouth with mouthwash if you wish.                            Do not swallow any toothpaste of mouthwash.     _X__ 3.  No Alcohol for 24 hours before or after surgery.   _X__ 4.  Do Not Smoke or use e-cigarettes For 24 Hours Prior to Your Surgery.                 Do not use any chewable tobacco products for at least 6 hours prior to                 surgery.  ____  5.  Bring all medications with you on the day of surgery if instructed.   _x___  6.  Notify your doctor if there is any change in your medical condition      (cold, fever, infections).     Do not wear jewelry, make-up, hairpins, clips or nail polish. Do not wear lotions, powders, or perfumes. You may wear deodorant. Do not shave 48 hours prior to surgery. Men may shave face and neck. Do not  bring valuables to the hospital.    [facility] is not responsible for any belongings or valuables.  Contacts, dentures or bridgework may not be worn into surgery. Leave your suitcase in the car. After surgery it may be brought to your room. For patients admitted to the hospital, discharge time is determined by your treatment team.   Patients discharged the day of surgery will not be  allowed to drive home.   Please read over the following fact sheets that you were given:   Preparing for surgery   ____ Take these medicines the morning of surgery with A SIP OF WATER:    1. ATENOLOL  2. VALIUM  3. FLONASE, IF NEEDED  4. PLEASE TAKE LIPITOR NIGHT BEFORE  5.  6.  ____ Fleet Enema (as directed)   __X__ Use CHG Soap as directed   ___ Stop ALL ASPIRIN PRODUCTS NOW!!               THIS INCLUDES EXCEDRIN / BC POWDER / GOODYS POWDER  _X___ Stop Anti-inflammatories NOW!!               THIS INCLUDES IBUPROFEN / MOTRIN / ALEVE / ADVIL   __X__ Stop supplements until after surgery.                  STOP B COMPLEX                   CONTINUE TO USE MAGNESIUM AS NEEDED BUT NOT ON SURGERY DAY  ____ Bring C-Pap to the hospital.   CONTINUE DIOVAN UP UNTIL DAY OF SURGERY. DO NOT TAKE ON SURGERY DAY  WEAR LOOSE FITTING CLOTHES  HAVE STOOL SOFTENERS FOR HOME USE  HAVE A SMALL FIRM PILLOW TO HELP ABDOMEN

## 2017-10-01 NOTE — Care Management (Signed)
EKG reviewed. T wave inversions not seen in inferior  leads. No further testing needed.

## 2017-10-02 MED ORDER — CEFAZOLIN SODIUM-DEXTROSE 2-4 GM/100ML-% IV SOLN
2.0000 g | INTRAVENOUS | Status: AC
  Administered 2017-10-03: 2 g via INTRAVENOUS

## 2017-10-03 ENCOUNTER — Ambulatory Visit: Payer: Medicare Other | Admitting: Anesthesiology

## 2017-10-03 ENCOUNTER — Encounter: Payer: Self-pay | Admitting: General Surgery

## 2017-10-03 ENCOUNTER — Ambulatory Visit
Admission: RE | Admit: 2017-10-03 | Discharge: 2017-10-03 | Disposition: A | Discharge status: Home / Self Care | Payer: Medicare Other | Source: Ambulatory Visit | Attending: General Surgery | Admitting: General Surgery

## 2017-10-03 ENCOUNTER — Encounter
Admission: RE | Disposition: A | Discharge status: Home / Self Care | Payer: Self-pay | Source: Ambulatory Visit | Attending: General Surgery

## 2017-10-03 DIAGNOSIS — F329 Major depressive disorder, single episode, unspecified: Secondary | ICD-10-CM | POA: Diagnosis not present

## 2017-10-03 DIAGNOSIS — K8010 Calculus of gallbladder with chronic cholecystitis without obstruction: Secondary | ICD-10-CM | POA: Diagnosis not present

## 2017-10-03 DIAGNOSIS — F17200 Nicotine dependence, unspecified, uncomplicated: Secondary | ICD-10-CM | POA: Diagnosis not present

## 2017-10-03 DIAGNOSIS — Z79899 Other long term (current) drug therapy: Secondary | ICD-10-CM | POA: Insufficient documentation

## 2017-10-03 DIAGNOSIS — K760 Fatty (change of) liver, not elsewhere classified: Secondary | ICD-10-CM | POA: Insufficient documentation

## 2017-10-03 DIAGNOSIS — K219 Gastro-esophageal reflux disease without esophagitis: Secondary | ICD-10-CM | POA: Insufficient documentation

## 2017-10-03 DIAGNOSIS — G2581 Restless legs syndrome: Secondary | ICD-10-CM | POA: Insufficient documentation

## 2017-10-03 DIAGNOSIS — Z882 Allergy status to sulfonamides status: Secondary | ICD-10-CM | POA: Diagnosis not present

## 2017-10-03 DIAGNOSIS — Z888 Allergy status to other drugs, medicaments and biological substances status: Secondary | ICD-10-CM | POA: Diagnosis not present

## 2017-10-03 DIAGNOSIS — E785 Hyperlipidemia, unspecified: Secondary | ICD-10-CM | POA: Diagnosis not present

## 2017-10-03 DIAGNOSIS — F419 Anxiety disorder, unspecified: Secondary | ICD-10-CM | POA: Diagnosis not present

## 2017-10-03 DIAGNOSIS — M47817 Spondylosis without myelopathy or radiculopathy, lumbosacral region: Secondary | ICD-10-CM | POA: Diagnosis not present

## 2017-10-03 DIAGNOSIS — I10 Essential (primary) hypertension: Secondary | ICD-10-CM | POA: Diagnosis not present

## 2017-10-03 HISTORY — PX: CHOLECYSTECTOMY: SHX55

## 2017-10-03 SURGERY — LAPAROSCOPIC CHOLECYSTECTOMY
Anesthesia: General | Wound class: Clean Contaminated

## 2017-10-03 MED ORDER — ONDANSETRON HCL 4 MG/2ML IJ SOLN
INTRAMUSCULAR | Status: AC
  Filled 2017-10-03: qty 2

## 2017-10-03 MED ORDER — LIDOCAINE HCL (CARDIAC) 20 MG/ML IV SOLN
INTRAVENOUS | Status: DC | PRN
  Administered 2017-10-03: 100 mg via INTRAVENOUS

## 2017-10-03 MED ORDER — EPHEDRINE SULFATE 50 MG/ML IJ SOLN
INTRAMUSCULAR | Status: AC
  Filled 2017-10-03: qty 1

## 2017-10-03 MED ORDER — ACETAMINOPHEN 160 MG/5ML PO SOLN
325.0000 mg | ORAL | Status: DC | PRN
  Filled 2017-10-03: qty 20.3

## 2017-10-03 MED ORDER — DEXAMETHASONE SODIUM PHOSPHATE 10 MG/ML IJ SOLN
INTRAMUSCULAR | Status: AC
  Filled 2017-10-03: qty 2

## 2017-10-03 MED ORDER — ACETAMINOPHEN 325 MG PO TABS: 325.0000 mg | ORAL_TABLET | ORAL | Status: DC | PRN

## 2017-10-03 MED ORDER — BUPIVACAINE-EPINEPHRINE (PF) 0.5% -1:200000 IJ SOLN
INTRAMUSCULAR | Status: AC
Start: 2017-10-03 — End: ?
  Filled 2017-10-03: qty 30

## 2017-10-03 MED ORDER — SUGAMMADEX SODIUM 200 MG/2ML IV SOLN
INTRAVENOUS | Status: DC | PRN
  Administered 2017-10-03: 200 mg via INTRAVENOUS

## 2017-10-03 MED ORDER — FENTANYL CITRATE (PF) 100 MCG/2ML IJ SOLN
INTRAMUSCULAR | Status: AC
  Filled 2017-10-03: qty 2

## 2017-10-03 MED ORDER — ROCURONIUM BROMIDE 50 MG/5ML IV SOLN
INTRAVENOUS | Status: AC
  Filled 2017-10-03: qty 1

## 2017-10-03 MED ORDER — PROPOFOL 10 MG/ML IV BOLUS
INTRAVENOUS | Status: AC
  Filled 2017-10-03: qty 20

## 2017-10-03 MED ORDER — FENTANYL CITRATE (PF) 100 MCG/2ML IJ SOLN
INTRAMUSCULAR | Status: DC | PRN
  Administered 2017-10-03 (×2): 100 ug via INTRAVENOUS

## 2017-10-03 MED ORDER — FAMOTIDINE 20 MG PO TABS
ORAL_TABLET | ORAL | Status: AC
  Administered 2017-10-03: 20 mg via ORAL
  Filled 2017-10-03: qty 1

## 2017-10-03 MED ORDER — TRAMADOL HCL 50 MG PO TABS: 50.0000 mg | ORAL_TABLET | Freq: Four times a day (QID) | ORAL | 0 refills | Status: AC | PRN

## 2017-10-03 MED ORDER — MIDAZOLAM HCL 2 MG/2ML IJ SOLN
INTRAMUSCULAR | Status: DC | PRN
  Administered 2017-10-03: 2 mg via INTRAVENOUS

## 2017-10-03 MED ORDER — MIDAZOLAM HCL 2 MG/2ML IJ SOLN
INTRAMUSCULAR | Status: AC
  Filled 2017-10-03: qty 2

## 2017-10-03 MED ORDER — FAMOTIDINE 20 MG PO TABS
20.0000 mg | ORAL_TABLET | Freq: Once | ORAL | Status: AC
  Administered 2017-10-03: 20 mg via ORAL

## 2017-10-03 MED ORDER — PROPOFOL 10 MG/ML IV BOLUS
INTRAVENOUS | Status: DC | PRN
  Administered 2017-10-03: 150 mg via INTRAVENOUS

## 2017-10-03 MED ORDER — KETOROLAC TROMETHAMINE 30 MG/ML IJ SOLN
INTRAMUSCULAR | Status: AC
  Filled 2017-10-03: qty 1

## 2017-10-03 MED ORDER — LACTATED RINGERS IV SOLN
INTRAVENOUS | Status: DC
  Administered 2017-10-03: 500 mL via INTRAVENOUS
  Administered 2017-10-03: 07:00:00 via INTRAVENOUS

## 2017-10-03 MED ORDER — ROCURONIUM BROMIDE 100 MG/10ML IV SOLN
INTRAVENOUS | Status: DC | PRN
  Administered 2017-10-03: 40 mg via INTRAVENOUS

## 2017-10-03 MED ORDER — SUGAMMADEX SODIUM 200 MG/2ML IV SOLN
INTRAVENOUS | Status: AC
  Filled 2017-10-03: qty 2

## 2017-10-03 MED ORDER — HYDROMORPHONE HCL 1 MG/ML IJ SOLN: 0.2500 mg | INTRAMUSCULAR | Status: DC | PRN

## 2017-10-03 MED ORDER — BUPIVACAINE-EPINEPHRINE 0.5% -1:200000 IJ SOLN
INTRAMUSCULAR | Status: DC | PRN
  Administered 2017-10-03: 10 mL

## 2017-10-03 MED ORDER — CEFAZOLIN SODIUM-DEXTROSE 2-4 GM/100ML-% IV SOLN
INTRAVENOUS | Status: AC
  Filled 2017-10-03: qty 100

## 2017-10-03 MED ORDER — ONDANSETRON HCL 4 MG/2ML IJ SOLN
4.0000 mg | Freq: Once | INTRAMUSCULAR | Status: AC | PRN
  Administered 2017-10-03: 4 mg via INTRAVENOUS

## 2017-10-03 MED ORDER — DEXAMETHASONE SODIUM PHOSPHATE 10 MG/ML IJ SOLN
INTRAMUSCULAR | Status: DC | PRN
  Administered 2017-10-03: 6 mg via INTRAVENOUS

## 2017-10-03 MED ORDER — ONDANSETRON HCL 4 MG/2ML IJ SOLN
INTRAMUSCULAR | Status: DC | PRN
  Administered 2017-10-03: 4 mg via INTRAVENOUS

## 2017-10-03 MED ORDER — EPHEDRINE SULFATE 50 MG/ML IJ SOLN
INTRAMUSCULAR | Status: DC | PRN
  Administered 2017-10-03: 10 mg via INTRAVENOUS

## 2017-10-03 MED ORDER — HYDROCODONE-ACETAMINOPHEN 7.5-325 MG PO TABS
1.0000 | ORAL_TABLET | Freq: Once | ORAL | Status: DC | PRN
  Filled 2017-10-03: qty 1

## 2017-10-03 MED ORDER — MEPERIDINE HCL 50 MG/ML IJ SOLN: 6.2500 mg | INTRAMUSCULAR | Status: DC | PRN

## 2017-10-03 MED ORDER — LABETALOL HCL 5 MG/ML IV SOLN
INTRAVENOUS | Status: DC | PRN
  Administered 2017-10-03: 5 mg via INTRAVENOUS

## 2017-10-03 SURGICAL SUPPLY — 35 items
APPLIER CLIP LOGIC TI 5 (MISCELLANEOUS) ×3 IMPLANT
BLADE SURG SZ11 CARB STEEL (BLADE) ×3 IMPLANT
CANISTER SUCT 1200ML W/VALVE (MISCELLANEOUS) ×3 IMPLANT
CHLORAPREP W/TINT 26ML (MISCELLANEOUS) ×3 IMPLANT
DERMABOND ADVANCED (GAUZE/BANDAGES/DRESSINGS) ×2
DERMABOND ADVANCED .7 DNX12 (GAUZE/BANDAGES/DRESSINGS) ×1 IMPLANT
DRAPE SHEET LG 3/4 BI-LAMINATE (DRAPES) ×3 IMPLANT
ELECT REM PT RETURN 9FT ADLT (ELECTROSURGICAL) ×3
ELECTRODE REM PT RTRN 9FT ADLT (ELECTROSURGICAL) ×1 IMPLANT
GLOVE BIO SURGEON STRL SZ 6.5 (GLOVE) ×2 IMPLANT
GLOVE BIO SURGEONS STRL SZ 6.5 (GLOVE) ×1
GOWN STRL REUS W/ TWL LRG LVL3 (GOWN DISPOSABLE) ×4 IMPLANT
GOWN STRL REUS W/TWL LRG LVL3 (GOWN DISPOSABLE) ×8
GRASPER SUT TROCAR 14GX15 (MISCELLANEOUS) ×3 IMPLANT
HEMOSTAT SURGICEL 2X3 (HEMOSTASIS) IMPLANT
IRRIGATION STRYKERFLOW (MISCELLANEOUS) ×1 IMPLANT
IRRIGATOR STRYKERFLOW (MISCELLANEOUS) ×3
IV NS 1000ML (IV SOLUTION) ×2
IV NS 1000ML BAXH (IV SOLUTION) ×1 IMPLANT
KIT TURNOVER KIT A (KITS) ×3 IMPLANT
LABEL OR SOLS (LABEL) ×3 IMPLANT
NEEDLE HYPO 25X1 1.5 SAFETY (NEEDLE) ×3 IMPLANT
NEEDLE INSUFFLATION 14GA 120MM (NEEDLE) ×3 IMPLANT
NS IRRIG 500ML POUR BTL (IV SOLUTION) ×3 IMPLANT
PACK LAP CHOLECYSTECTOMY (MISCELLANEOUS) ×3 IMPLANT
PENCIL ELECTRO HAND CTR (MISCELLANEOUS) ×3 IMPLANT
POUCH SPECIMEN RETRIEVAL 10MM (ENDOMECHANICALS) ×3 IMPLANT
SCISSORS METZENBAUM CVD 33 (INSTRUMENTS) ×3 IMPLANT
SLEEVE ENDOPATH XCEL 5M (ENDOMECHANICALS) ×6 IMPLANT
SUT MNCRL AB 4-0 PS2 18 (SUTURE) ×3 IMPLANT
SUT VIC AB 0 CT1 36 (SUTURE) ×3 IMPLANT
SUT VICRYL 0 AB UR-6 (SUTURE) ×3 IMPLANT
TROCAR XCEL NON-BLD 11X100MML (ENDOMECHANICALS) ×3 IMPLANT
TROCAR XCEL NON-BLD 5MMX100MML (ENDOMECHANICALS) ×3 IMPLANT
TUBING INSUFFLATION (TUBING) ×3 IMPLANT

## 2017-10-03 NOTE — Anesthesia Procedure Notes (Signed)
Procedure Name: Intubation Date/Time: 10/03/2017 7:33 AM Performed by: Philbert Riser, CRNA Pre-anesthesia Checklist: Patient identified, Emergency Drugs available, Suction available, Timeout performed and Patient being monitored Patient Re-evaluated:Patient Re-evaluated prior to induction Oxygen Delivery Method: Circle system utilized and Simple face mask Preoxygenation: Pre-oxygenation with 100% oxygen Induction Type: IV induction Ventilation: Mask ventilation without difficulty Laryngoscope Size: Mac and 3 Grade View: Grade I Tube type: Oral Tube size: 7.5 mm Number of attempts: 1 Airway Equipment and Method: Stylet Placement Confirmation: ETT inserted through vocal cords under direct vision,  positive ETCO2 and breath sounds checked- equal and bilateral Secured at: 20 cm Tube secured with: Tape Dental Injury: Teeth and Oropharynx as per pre-operative assessment

## 2017-10-03 NOTE — Transfer of Care (Signed)
Immediate Anesthesia Transfer of Care Note  Patient: Megan Irwin  Procedure(s) Performed: LAPAROSCOPIC CHOLECYSTECTOMY (N/A )  Patient Location: PACU  Anesthesia Type:General  Level of Consciousness: awake, alert  and oriented  Airway & Oxygen Therapy: Patient Spontanous Breathing and Patient connected to face mask oxygen  Post-op Assessment: Report given to RN and Post -op Vital signs reviewed and stable  Post vital signs: Reviewed and stable  Last Vitals:  Vitals:   10/03/17 0613  BP: (!) 188/69  Pulse: 66  Resp: 16  Temp: 36.9 C  SpO2: 98%    Last Pain:  Vitals:   10/03/17 0613  TempSrc: Oral         Complications: No apparent anesthesia complications

## 2017-10-03 NOTE — Interval H&P Note (Signed)
History and Physical Interval Note:  10/03/2017 6:58 AM  Megan Irwin  has presented today for surgery, with the diagnosis of cholelithiasis  The various methods of treatment have been discussed with the patient and family. After consideration of risks, benefits and other options for treatment, the patient has consented to  Procedure(s): LAPAROSCOPIC CHOLECYSTECTOMY (N/A) as a surgical intervention .  The patient's history has been reviewed, patient examined, no change in status, stable for surgery.  I have reviewed the patient's chart and labs.  Questions were answered to the patient's satisfaction.     Carolan ShiverEdgardo Cintron-Diaz

## 2017-10-03 NOTE — Op Note (Signed)
Preoperative diagnosis: Symptomatic cholelithiasis  Postoperative diagnosis: Symptomatic cholelithiasis.  Procedure: Laparoscopic Cholecystectomy.   Anesthesia: GETA   Surgeon: Dr. Hazle Quantintron Diaz  Wound Classification: Clean Contaminated  Indications: Patient is a 72 y.o. female developed right upper quadrant pain associanted with nausea and on workup was found to have cholelithiasis with a normal common bile duct. Laparoscopic cholecystectomy was elected.  Findings: Critical view of safety achieved Cystic duct and artery identified, ligated and divided Adequate hemostasis  Description of procedure: The patient was placed on the operating table in the supine position. General anesthesia was induced. A time-out was completed verifying correct patient, procedure, site, positioning, and implant(s) and/or special equipment prior to beginning this procedure. An orogastric tube was placed. The abdomen was prepped and draped in the usual sterile fashion.  An incision was made in a natural skin line below the umbilicus.  The fascia was elevated and the Veress needle inserted. Proper position was confirmed by aspiration and saline meniscus test.  The abdomen was insufflated with carbon dioxide to a pressure of 15 mmHg. The patient tolerated insufflation well. A 11-mm trocar was then inserted.  The laparoscope was inserted and the abdomen inspected. No injuries from initial trocar placement were noted. Additional trocars were then inserted in the following locations: a 5-mm trocar in the right epigastrium and two 5-mm trocars along the right costal margin. The abdomen was inspected and no abnormalities were found. The table was placed in the reverse Trendelenburg position with the right side up.  The dome of the gallbladder was grasped with an atraumatic grasper passed through the lateral port and retracted over the dome of the liver. The infundibulum was also grasped with an atraumatic grasper through  the midclavicular port and retracted toward the right lower quadrant. This maneuver exposed Calot's triangle. The peritoneum overlying the gallbladder infundibulum was then incised and the cystic duct and cystic artery identified and circumferentially dissected. A time out was performed to review the structures and the critical view of safety. The cystic duct and cystic artery were then doubly clipped and divided close to the gallbladder.  The gallbladder was then dissected from its peritoneal attachments by electrocautery. Hemostasis was checked and the gallbladder and contained stones were removed using an endoscopic retrieval bag placed through the umbilical port. The gallbladder was passed off the table as a specimen. The gallbladder fossa was copiously irrigated with saline and hemostasis was obtained. There was no evidence of bleeding from the gallbladder fossa or cystic artery or leakage of the bile from the cystic duct stump. Secondary trocars were removed under direct vision. No bleeding was noted. The laparoscope was withdrawn and the umbilical trocar removed. The abdomen was allowed to collapse. The fascia of the 11mm trocar sites was closed with figure-of-eight 0 vicryl sutures. The skin was closed with subcuticular sutures of 4-0 monocryl and topical skin adhesive. The orogastric tube was removed.  The patient tolerated the procedure well and was taken to the postanesthesia care unit in stable condition.   Specimen: Gallbladder  Complications: None  EBL: 5mL

## 2017-10-03 NOTE — Anesthesia Postprocedure Evaluation (Signed)
Anesthesia Post Note  Patient: Megan Irwin  Procedure(s) Performed: LAPAROSCOPIC CHOLECYSTECTOMY (N/A )  Patient location during evaluation: PACU Anesthesia Type: General Level of consciousness: awake and alert Pain management: pain level controlled Vital Signs Assessment: post-procedure vital signs reviewed and stable Respiratory status: spontaneous breathing, nonlabored ventilation and respiratory function stable Cardiovascular status: blood pressure returned to baseline and stable Postop Assessment: no apparent nausea or vomiting Anesthetic complications: no     Last Vitals:  Vitals:   10/03/17 0959 10/03/17 1028  BP: (!) 185/78 (!) 187/66  Pulse:  62  Resp:  16  Temp:    SpO2: 96% 98%    Last Pain:  Vitals:   10/03/17 1028  TempSrc:   PainSc: 5                  Walt Geathers Garry Heater Jazziel Fitzsimmons

## 2017-10-03 NOTE — Anesthesia Preprocedure Evaluation (Addendum)
Anesthesia Evaluation  Patient identified by MRN, date of birth, ID band Patient awake    Reviewed: Allergy & Precautions, H&P , NPO status , reviewed documented beta blocker date and time   Airway Mallampati: II  TM Distance: <3 FB     Dental  (+) Upper Dentures, Lower Dentures   Pulmonary Current Smoker,    Pulmonary exam normal        Cardiovascular hypertension, Normal cardiovascular exam+ dysrhythmias      Neuro/Psych  Headaches, Anxiety  Neuromuscular disease    GI/Hepatic GERD  Controlled,  Endo/Other    Renal/GU      Musculoskeletal   Abdominal   Peds  Hematology   Anesthesia Other Findings   Reproductive/Obstetrics                             Anesthesia Physical Anesthesia Plan  ASA: III  Anesthesia Plan: General ETT   Post-op Pain Management:    Induction:   PONV Risk Score and Plan: Propofol infusion  Airway Management Planned:   Additional Equipment:   Intra-op Plan:   Post-operative Plan:   Informed Consent: I have reviewed the patients History and Physical, chart, labs and discussed the procedure including the risks, benefits and alternatives for the proposed anesthesia with the patient or authorized representative who has indicated his/her understanding and acceptance.   Dental Advisory Given  Plan Discussed with: CRNA  Anesthesia Plan Comments:        Anesthesia Quick Evaluation

## 2017-10-03 NOTE — Anesthesia Post-op Follow-up Note (Signed)
Anesthesia QCDR form completed.        

## 2017-10-03 NOTE — Discharge Instructions (Signed)

## 2017-10-04 LAB — SURGICAL PATHOLOGY

## 2017-11-26 DIAGNOSIS — M7918 Myalgia, other site: Secondary | ICD-10-CM | POA: Insufficient documentation

## 2017-11-26 DIAGNOSIS — Z87891 Personal history of nicotine dependence: Secondary | ICD-10-CM | POA: Insufficient documentation

## 2017-12-24 DIAGNOSIS — Z9049 Acquired absence of other specified parts of digestive tract: Secondary | ICD-10-CM | POA: Insufficient documentation

## 2018-06-03 ENCOUNTER — Other Ambulatory Visit: Payer: Self-pay | Admitting: Pediatrics

## 2018-06-03 DIAGNOSIS — Z780 Asymptomatic menopausal state: Secondary | ICD-10-CM

## 2018-06-21 IMAGING — US US ABDOMEN LIMITED
1 series · 14 of 25 positions shown · non-contrast
Comparison: None.

CLINICAL DATA: Severe right upper quadrant abdominal pain for 2
days.

EXAM:
ULTRASOUND ABDOMEN LIMITED RIGHT UPPER QUADRANT

[Series 1: us abdomen limited · 0.23mm/px · 14 of 42 slices shown]
[im 1/42]
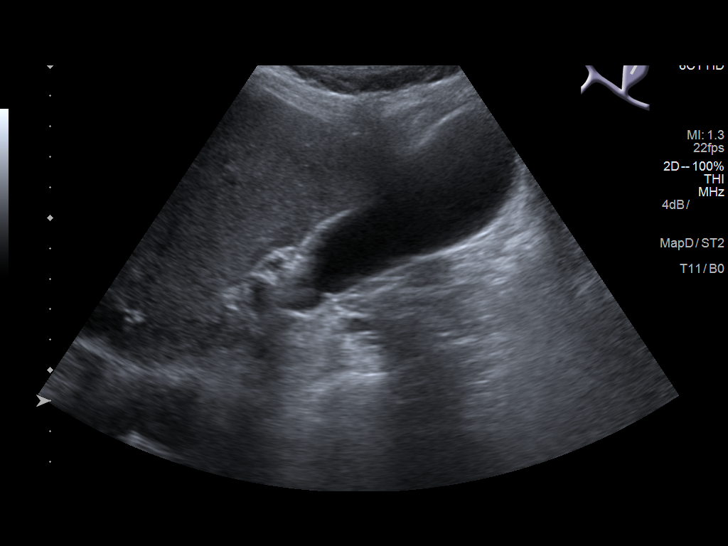
[im 4/42]
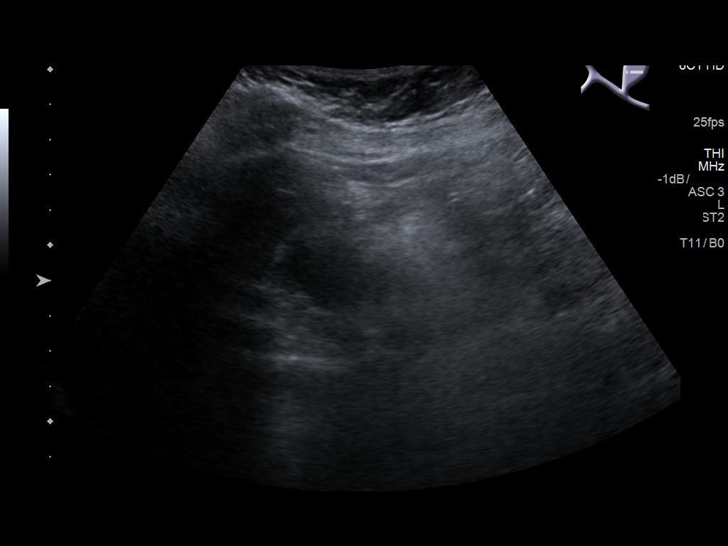
[im 7/42]
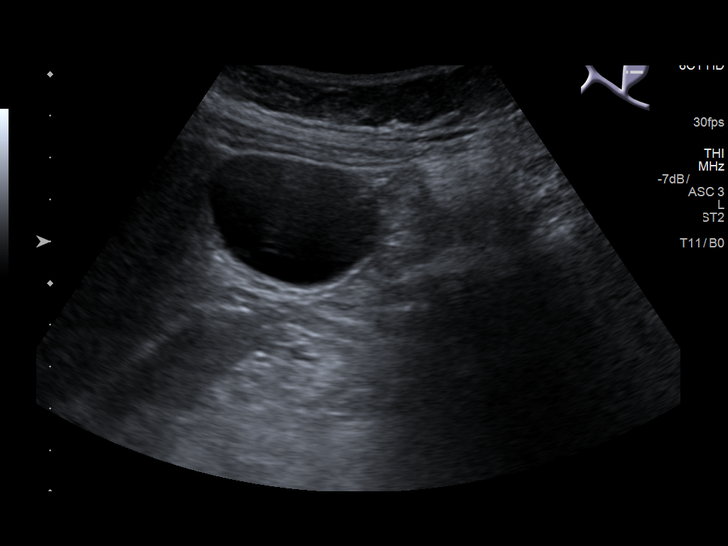
[im 11/42]
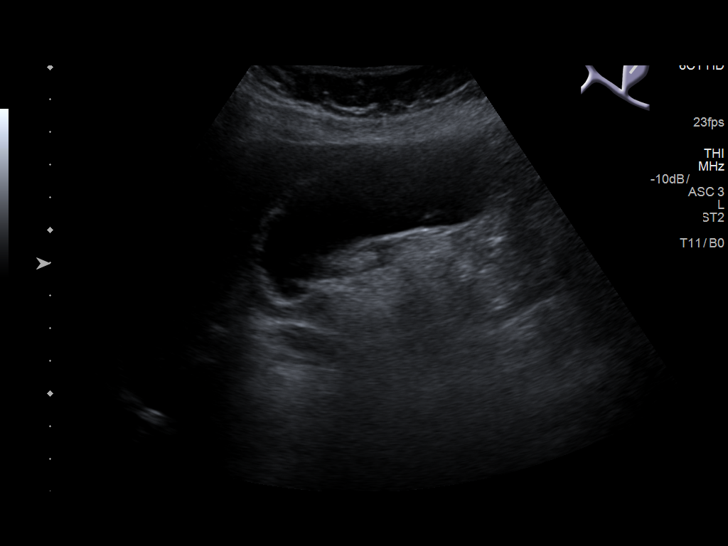
[im 14/42]
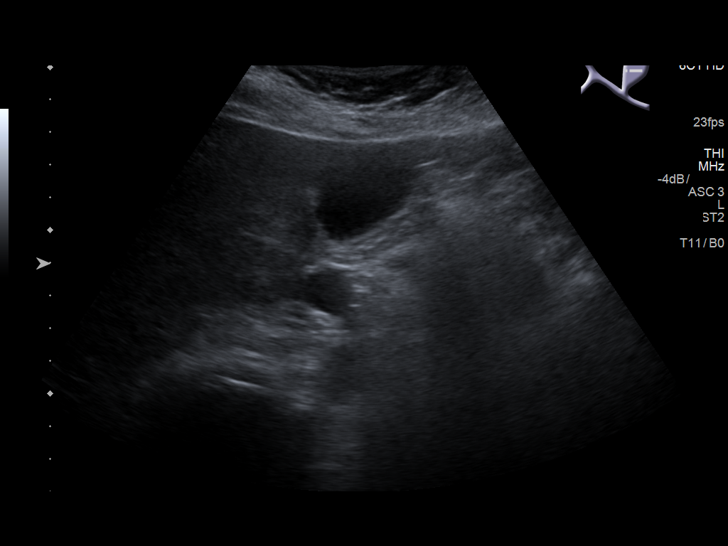
[im 16/42]
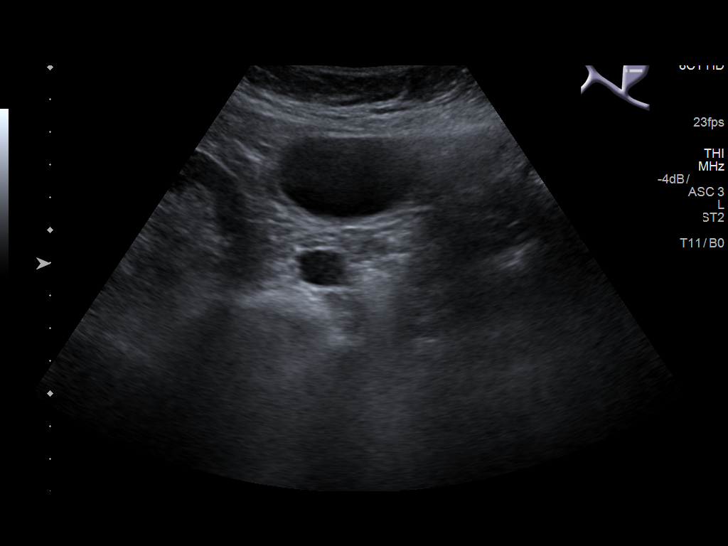
[im 19/42]
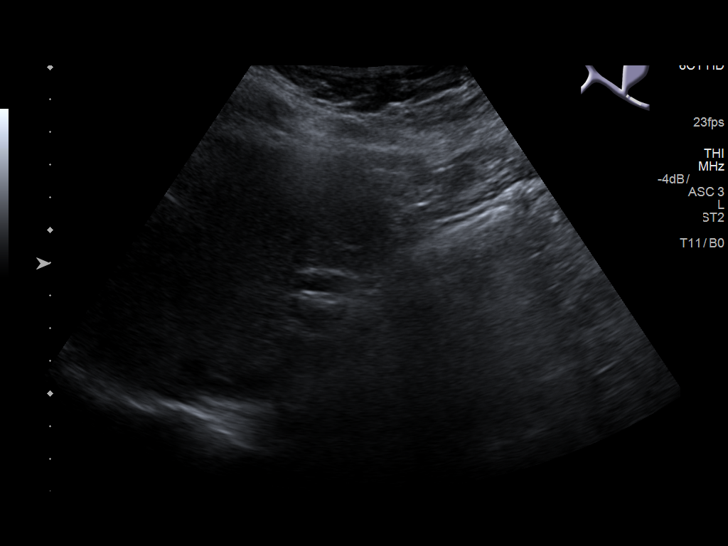
[im 23/42]
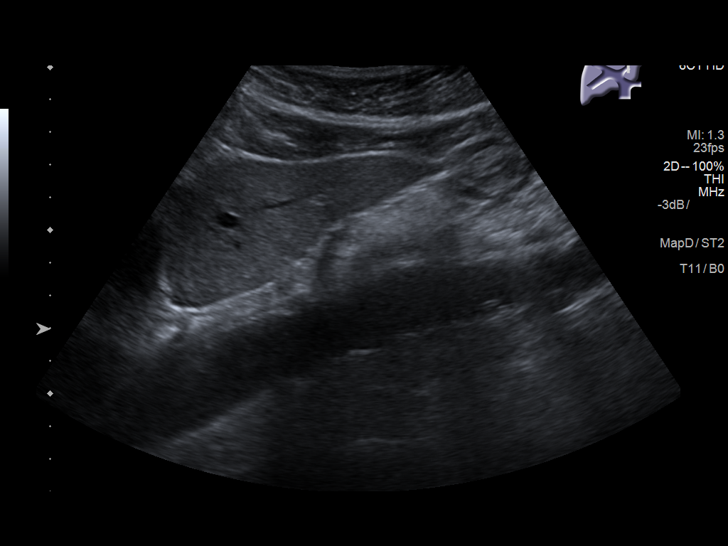
[im 26/42]
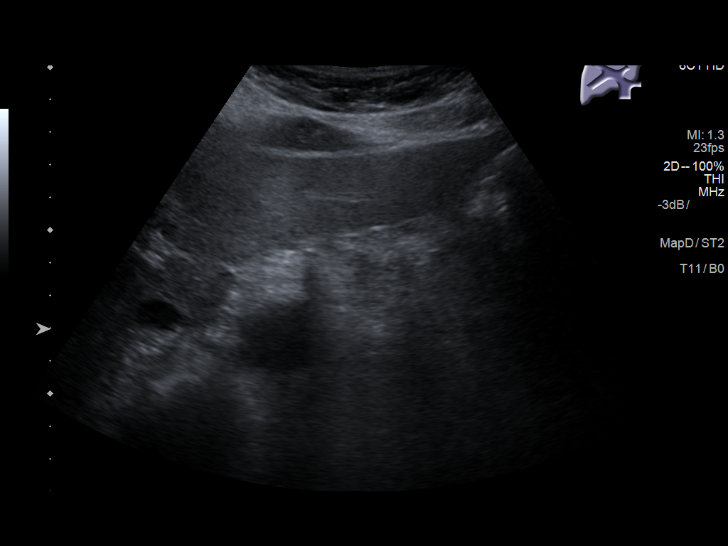
[im 28/42]
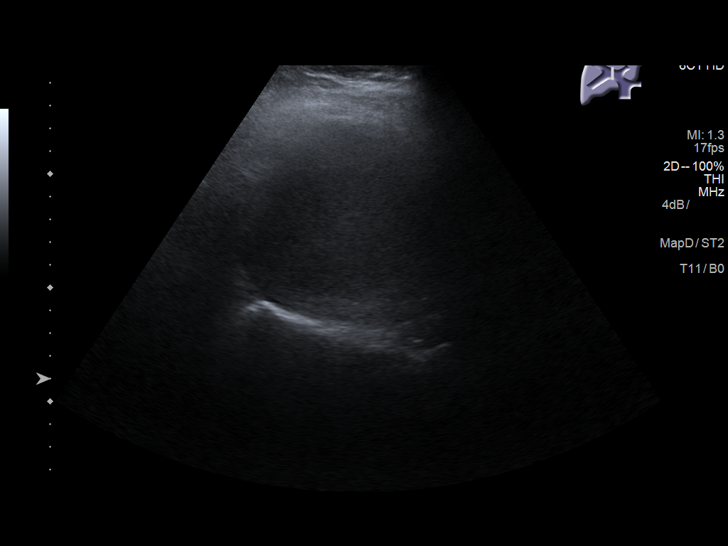
[im 31/42]
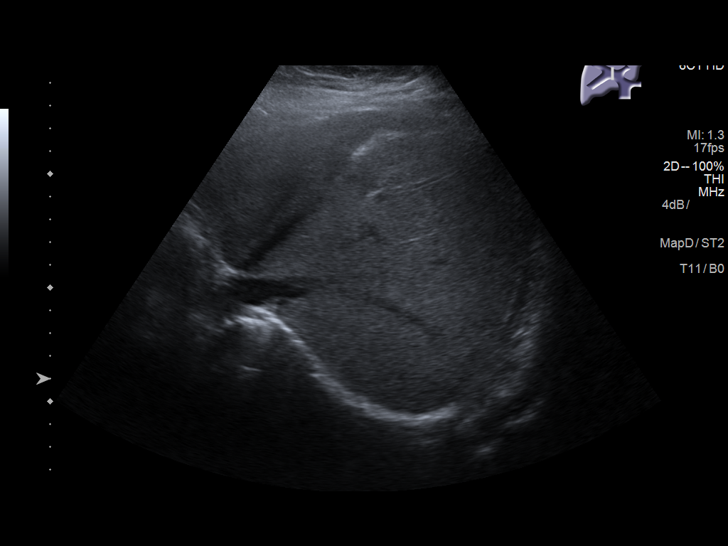
[im 35/42]
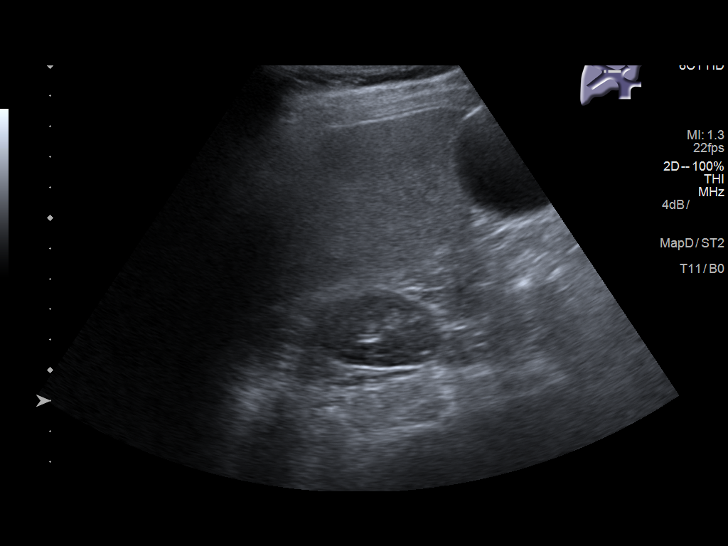
[im 38/42]
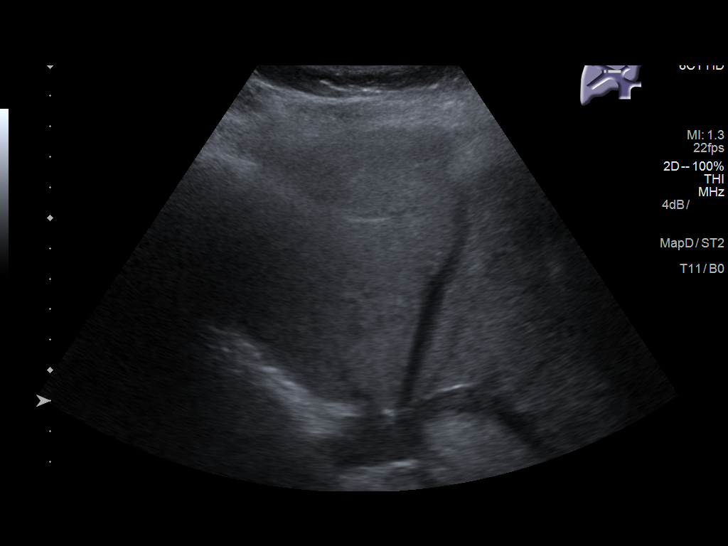
[im 42/42]
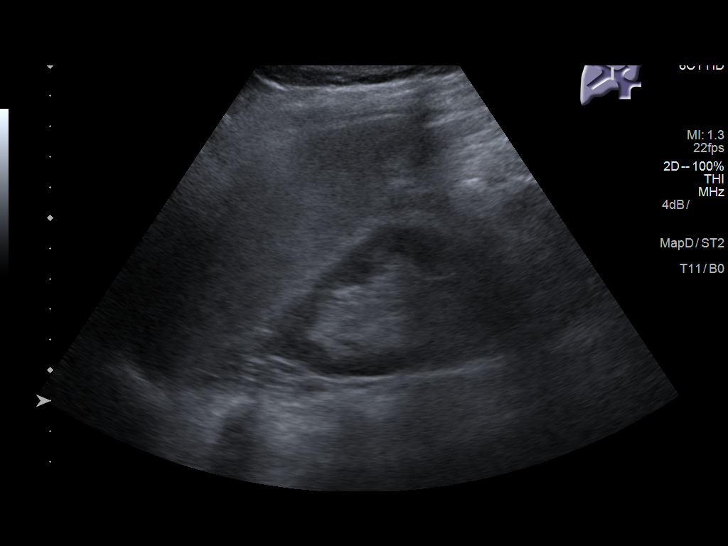

[14 of 25 positions shown; findings below may reference images not displayed]

FINDINGS: Gallbladder:

There layering gallstones in the fundus of the gallbladder the
largest of which measures 7 mm. No evidence of abnormal distention
of the gallbladder or gallbladder wall thickening. Gallbladder wall
measures 2.3 mm in thickness. The sonographic Murphy's sign was
reported as negative.

Common bile duct:

Diameter: 5.9 mm

Liver:

No focal lesion identified. Diffusely increased parenchymal
echogenicity. Portal vein is patent on color Doppler imaging with
normal direction of blood flow towards the liver.
IMPRESSION: Cholelithiasis without evidence of acute cholecystitis.

Diffusely increased parenchymal echogenicity of the liver usually
associated with hepatic steatosis.

## 2019-08-05 DIAGNOSIS — R0789 Other chest pain: Secondary | ICD-10-CM | POA: Insufficient documentation

## 2019-09-01 DIAGNOSIS — R0602 Shortness of breath: Secondary | ICD-10-CM | POA: Insufficient documentation

## 2019-10-28 DIAGNOSIS — I252 Old myocardial infarction: Secondary | ICD-10-CM | POA: Insufficient documentation

## 2019-12-23 ENCOUNTER — Other Ambulatory Visit: Payer: Self-pay

## 2019-12-23 ENCOUNTER — Ambulatory Visit (INDEPENDENT_AMBULATORY_CARE_PROVIDER_SITE_OTHER): Payer: Medicare Other

## 2019-12-23 ENCOUNTER — Encounter: Payer: Self-pay | Admitting: Emergency Medicine

## 2019-12-23 ENCOUNTER — Ambulatory Visit
Admission: EM | Admit: 2019-12-23 | Discharge: 2019-12-23 | Disposition: A | Discharge status: Home / Self Care | Payer: Medicare Other | Attending: Emergency Medicine | Admitting: Emergency Medicine

## 2019-12-23 DIAGNOSIS — S93402A Sprain of unspecified ligament of left ankle, initial encounter: Secondary | ICD-10-CM | POA: Diagnosis not present

## 2019-12-23 NOTE — Discharge Instructions (Addendum)
Your x-ray is normal other than some soft tissue swelling.  There is no fracture, fluid in your ankle.  I suspect that you have severely sprained your ankle.  Wear the ASO as needed for support and swelling try and keep your ankle elevated above your heart is much as possible.  Follow-up with Dr. Martha Clan, sports medicine.  You may benefit from physical therapy.  You can take 400 mg of ibuprofen combined with 1000 mg of Tylenol 3-4 times a day as needed for pain.

## 2019-12-23 NOTE — ED Provider Notes (Signed)
HPI  SUBJECTIVE:  Megan Irwin is a 74 y.o. female who presents with persistent left ankle swelling, erythema, pain with certain movements after stepping off of a curb on 5/8.  Patient states that she stepped straight down and did not invert or evert her ankle.  She reports bruising which has resolved.  She is able to bear weight on this.  States that the swelling is daily, resolves at night, and gets worse during the day.  No numbness or tingling in her foot, leg swelling, heel pain.  She has not reinjured her ankle.  She wants to rule out a fracture.  She has tried Epsom salt soaks with improvement in her symptoms.  She also tried an Ace wrap, ice pack, occasional ibuprofen.  Symptoms are worse with stepping straight down, going up and down stairs.  She has a past medical history of hypertension, did not take her medications today, MI last year.  No history of left ankle injury, diabetes.  CBJ:SEGBTDVVO, Titus Mould, DO  Past Medical History:  Diagnosis Date  . Anxiety   . Cholelithiasis 09/2017  . Dysrhythmia 2019   skipping a beat with palpatations  . GERD (gastroesophageal reflux disease)    food gets "stuck" like an esophagitis  . Hypertension   . Insomnia   . Migraines    magnesium helps  . NAFL (nonalcoholic fatty liver)   . Restless leg syndrome    uses heating pad to resolve  . Sciatica 2019   pt is in a good place after getting an SI joint injection    Past Surgical History:  Procedure Laterality Date  . CHOLECYSTECTOMY N/A 10/03/2017   Procedure: LAPAROSCOPIC CHOLECYSTECTOMY;  Surgeon: Herbert Pun, MD;  Location: [facility] ORS;  Service: General;  Laterality: N/A;  . COLONOSCOPY    . TONSILLECTOMY    . TUBAL LIGATION      Family History  Problem Relation Age of Onset  . Pneumonia Mother   . Hypertension Mother   . Heart attack Father 45    Social History   Tobacco Use  . Smoking status: Former Smoker    Packs/day: 0.50    Types: Cigarettes    Quit  date: 08/2019    Years since quitting: 0.3  . Smokeless tobacco: Never Used  Substance Use Topics  . Alcohol use: No    Comment: can not tolerate alchohol  . Drug use: No    No current facility-administered medications for this encounter.  Current Outpatient Medications:  .  atenolol (TENORMIN) 100 MG tablet, Take 100 mg by mouth daily. In the morning, Disp: , Rfl:  .  atorvastatin (LIPITOR) 20 MG tablet, Take 20 mg by mouth daily at 6 PM. Patient only takes half a tablet most times, Disp: , Rfl:  .  B Complex-C (SUPER B COMPLEX PO), Take 1 capsule by mouth daily., Disp: , Rfl:  .  Chlorpheniramine-DM (CORICIDIN HBP COUGH/COLD PO), Take 1 tablet by mouth 2 (two) times daily as needed (for cold symptoms.)., Disp: , Rfl:  .  chlorthalidone (HYGROTON) 25 MG tablet, Take 25 mg by mouth daily., Disp: , Rfl:  .  diazepam (VALIUM) 2 MG tablet, Take 2 mg by mouth at bedtime., Disp: , Rfl: 3 .  fluticasone (FLONASE) 50 MCG/ACT nasal spray, Place 1-2 sprays into both nostrils daily as needed for allergies., Disp: , Rfl:  .  Magnesium 500 MG TABS, Take 500 mg by mouth daily as needed (for migraine headaches.)., Disp: , Rfl:  .  tiZANidine (ZANAFLEX) 2 MG tablet, Take 0.5 tablets by mouth 3 (three) times daily as needed., Disp: , Rfl:  .  valsartan (DIOVAN) 160 MG tablet, Take 160 mg by mouth daily., Disp: , Rfl:   Allergies  Allergen Reactions  . Calcium Channel Blockers Shortness Of Breath, Anxiety, Palpitations and Other (See Comments)    Cognitive effects also--difficulty thinking  . Hydrochlorothiazide Shortness Of Breath  . Sodium Metabisulfite Anaphylaxis    Sulfite--in foods  . Sulfites Swelling    Can't breathe  . Telmisartan-Hctz Shortness Of Breath    Chest pain & fatigue   . Cardizem [Diltiazem Hcl] Other (See Comments)    Difficulty eating, sleeping, and concentrating Heart goes crazy   . Sulfa Antibiotics Hives  . Acyclovir And Related   . Lexapro [Escitalopram Oxalate]  Other (See Comments)    Gi upset     ROS  As noted in HPI.   Physical Exam  BP (!) 183/71 (BP Location: Left Arm)   Pulse 61   Temp 97.7 F (36.5 C) (Oral)   Resp 18   Ht 5\' 3"  (1.6 m)   Wt 59 kg   SpO2 100%   BMI 23.03 kg/m   Constitutional: Well developed, well nourished, no acute distress Eyes:  EOMI, conjunctiva normal bilaterally HENT: Normocephalic, atraumatic,mucus membranes moist Respiratory: Normal inspiratory effort Cardiovascular: Normal rate GI: nondistended skin: No rash, skin intact Musculoskeletal:  L Ankle swelling, mild diffuse erythema.  Proximal fibula NT , Distal fibula tender, Medial malleolus tender,  Deltoid ligament medially NT,   ATFL laterally NT, calcaneofibular ligament laterally NT, posterior tablofibular ligament laterally tender,  Achilles NT, calcaneus NT,  Proximal 5th metatarsal NT, Midfoot NT, distal NVI with baseline sensation / motor to foot with DP 2+.  no pain with dorsiflexion/plantar flexion. no pain with inversion/eversion. - bruising. - squeeze test.  Ant drawer test stable. Pt able to bear weight in dept. trace edema bilateral lower extremities.  No calf tenderness.  Neurologic: Alert & oriented x 3, no focal neuro deficits Psychiatric: Speech and behavior appropriate   ED Course   Medications - No data to display  Orders Placed This Encounter  Procedures  . DG Ankle Complete Left    Standing Status:   Standing    Number of Occurrences:   1    Order Specific Question:   Reason for Exam (SYMPTOM  OR DIAGNOSIS REQUIRED)    Answer:   fall one month ago r/o fx  . Apply ASO ankle    Standing Status:   Standing    Number of Occurrences:   1    Order Specific Question:   Laterality    Answer:   Left    No results found for this or any previous visit (from the past 24 hour(s)). DG Ankle Complete Left  Result Date: 12/23/2019 CLINICAL DATA:  Left ankle twisting injury stepping off a curb 11/22/2019. Continued pain and  swelling. Initial encounter. EXAM: LEFT ANKLE COMPLETE - 3+ VIEW COMPARISON:  None. FINDINGS: Soft tissues about the ankle are swollen. No fracture or dislocation. No tibiotalar joint effusion. Plantar calcaneal spur noted. IMPRESSION: Soft tissue swelling without underlying acute bony or joint abnormality. Electronically Signed   By: 01/22/2020 M.D.   On: 12/23/2019 13:53    ED Clinical Impression  1. Sprain of left ankle, unspecified ligament, initial encounter      ED Assessment/Plan  Suspect severe ankle sprain.  Will x-ray ankle to rule out healing fracture.  Reviewed imaging independently.  Soft tissue swelling no fracture, dislocation, effusion. see radiology report for full details.  Blood Pressure noted.  It has been at this range over the past several visits.  Also states that she did not take her blood pressure medication today because it makes her sleepy. will have her keep an eye on this at home.  Patient with ankle sprain.  Doubt septic joint.  Home with Tylenol/ibuprofen, ASO which was provided here, continue ice and elevation.  Follow-up with orthopedics or sports medicine.  She may benefit from physical therapy.  Discussed imaging, MDM, treatment plan, and plan for follow-up with patient. Discussed sn/sx that should prompt return to the ED. patient agrees with plan.   No orders of the defined types were placed in this encounter.   *This clinic note was created using Dragon dictation software. Therefore, there may be occasional mistakes despite careful proofreading.   ?    Domenick Gong, MD 12/24/19 [phone]

## 2019-12-23 NOTE — ED Triage Notes (Signed)
Patient in today c/o left ankle pain and swelling since 11/22/19. Patient states she fell on 11/22/19 when stepping off a curb and twisting her left ankle.

## 2020-06-10 DIAGNOSIS — I2510 Atherosclerotic heart disease of native coronary artery without angina pectoris: Secondary | ICD-10-CM | POA: Insufficient documentation

## 2020-06-10 DIAGNOSIS — Z720 Tobacco use: Secondary | ICD-10-CM | POA: Insufficient documentation

## 2020-06-10 DIAGNOSIS — Z716 Tobacco abuse counseling: Secondary | ICD-10-CM | POA: Insufficient documentation

## 2020-06-10 DIAGNOSIS — F411 Generalized anxiety disorder: Secondary | ICD-10-CM | POA: Insufficient documentation

## 2020-06-10 DIAGNOSIS — S72332A Displaced oblique fracture of shaft of left femur, initial encounter for closed fracture: Secondary | ICD-10-CM | POA: Insufficient documentation

## 2020-06-10 DIAGNOSIS — E785 Hyperlipidemia, unspecified: Secondary | ICD-10-CM | POA: Insufficient documentation

## 2020-11-22 ENCOUNTER — Other Ambulatory Visit (HOSPITAL_COMMUNITY): Payer: Self-pay | Admitting: Orthopedic Surgery

## 2020-11-22 ENCOUNTER — Other Ambulatory Visit: Payer: Self-pay | Admitting: Orthopedic Surgery

## 2020-11-22 DIAGNOSIS — M25562 Pain in left knee: Secondary | ICD-10-CM

## 2020-11-22 DIAGNOSIS — M2392 Unspecified internal derangement of left knee: Secondary | ICD-10-CM

## 2020-11-28 ENCOUNTER — Other Ambulatory Visit: Payer: Self-pay

## 2020-11-28 ENCOUNTER — Ambulatory Visit
Admission: RE | Admit: 2020-11-28 | Discharge: 2020-11-28 | Disposition: A | Discharge status: Home / Self Care | Payer: Medicare Other | Source: Ambulatory Visit | Attending: Orthopedic Surgery | Admitting: Orthopedic Surgery

## 2020-11-28 DIAGNOSIS — M25562 Pain in left knee: Secondary | ICD-10-CM | POA: Insufficient documentation

## 2020-11-28 DIAGNOSIS — M2392 Unspecified internal derangement of left knee: Secondary | ICD-10-CM | POA: Insufficient documentation

## 2020-12-15 ENCOUNTER — Other Ambulatory Visit: Payer: Self-pay | Admitting: Student

## 2020-12-15 DIAGNOSIS — S72492K Other fracture of lower end of left femur, subsequent encounter for closed fracture with nonunion: Secondary | ICD-10-CM

## 2020-12-28 ENCOUNTER — Other Ambulatory Visit: Payer: Self-pay

## 2020-12-28 ENCOUNTER — Ambulatory Visit
Admission: RE | Admit: 2020-12-28 | Discharge: 2020-12-28 | Disposition: A | Discharge status: Home / Self Care | Payer: Medicare Other | Source: Ambulatory Visit | Attending: Student | Admitting: Student

## 2020-12-28 DIAGNOSIS — S72492K Other fracture of lower end of left femur, subsequent encounter for closed fracture with nonunion: Secondary | ICD-10-CM | POA: Diagnosis present

## 2021-03-02 DIAGNOSIS — Z8616 Personal history of COVID-19: Secondary | ICD-10-CM | POA: Insufficient documentation

## 2021-08-19 DIAGNOSIS — M549 Dorsalgia, unspecified: Secondary | ICD-10-CM | POA: Insufficient documentation

## 2021-12-16 DIAGNOSIS — M25571 Pain in right ankle and joints of right foot: Secondary | ICD-10-CM | POA: Insufficient documentation

## 2022-02-16 DIAGNOSIS — R7303 Prediabetes: Secondary | ICD-10-CM | POA: Insufficient documentation

## 2022-03-09 ENCOUNTER — Other Ambulatory Visit (INDEPENDENT_AMBULATORY_CARE_PROVIDER_SITE_OTHER): Payer: Self-pay | Admitting: Nurse Practitioner

## 2022-03-09 ENCOUNTER — Other Ambulatory Visit (INDEPENDENT_AMBULATORY_CARE_PROVIDER_SITE_OTHER): Payer: Self-pay | Admitting: Vascular Surgery

## 2022-03-09 DIAGNOSIS — R6889 Other general symptoms and signs: Secondary | ICD-10-CM

## 2022-03-09 DIAGNOSIS — R209 Unspecified disturbances of skin sensation: Secondary | ICD-10-CM

## 2022-03-14 ENCOUNTER — Ambulatory Visit (INDEPENDENT_AMBULATORY_CARE_PROVIDER_SITE_OTHER): Payer: Medicare Other | Admitting: Vascular Surgery

## 2022-03-14 ENCOUNTER — Ambulatory Visit (INDEPENDENT_AMBULATORY_CARE_PROVIDER_SITE_OTHER): Payer: Medicare Other

## 2022-03-14 ENCOUNTER — Encounter (INDEPENDENT_AMBULATORY_CARE_PROVIDER_SITE_OTHER): Payer: Self-pay | Admitting: Vascular Surgery

## 2022-03-14 VITALS — BP 147/80 | HR 56 | Resp 14 | Ht 63.0 in | Wt 126.0 lb

## 2022-03-14 DIAGNOSIS — M79604 Pain in right leg: Secondary | ICD-10-CM

## 2022-03-14 DIAGNOSIS — I70203 Unspecified atherosclerosis of native arteries of extremities, bilateral legs: Secondary | ICD-10-CM

## 2022-03-14 DIAGNOSIS — R209 Unspecified disturbances of skin sensation: Secondary | ICD-10-CM

## 2022-03-14 DIAGNOSIS — R6889 Other general symptoms and signs: Secondary | ICD-10-CM | POA: Diagnosis not present

## 2022-03-14 DIAGNOSIS — R7303 Prediabetes: Secondary | ICD-10-CM

## 2022-03-14 DIAGNOSIS — I70212 Atherosclerosis of native arteries of extremities with intermittent claudication, left leg: Secondary | ICD-10-CM | POA: Diagnosis not present

## 2022-03-14 DIAGNOSIS — I10 Essential (primary) hypertension: Secondary | ICD-10-CM

## 2022-03-14 DIAGNOSIS — M79605 Pain in left leg: Secondary | ICD-10-CM | POA: Diagnosis not present

## 2022-03-14 DIAGNOSIS — E785 Hyperlipidemia, unspecified: Secondary | ICD-10-CM

## 2022-03-14 DIAGNOSIS — I70219 Atherosclerosis of native arteries of extremities with intermittent claudication, unspecified extremity: Secondary | ICD-10-CM | POA: Insufficient documentation

## 2022-03-14 NOTE — Progress Notes (Signed)
Patient ID: Megan Irwin, female   DOB: 1945-12-01, 76 y.o.   MRN: [id number]  Chief Complaint  Patient presents with   New Patient (Initial Visit)    Ultrasound consult     HPI Megan Irwin is a 76 y.o. female.  I am asked to see the patient by Dr. Richardine Service for evaluation of peripheral arterial disease.  Patient had what sounds like an outpatient home health screening which suggested significant peripheral arterial disease.  She does describe claudication symptoms worse on the left leg than the right.  This is also the leg that she had a fractured femur on had to have repaired surgically.  She still has some pain and numbness related to that as well.  No ischemic rest pain.  No ulceration or gangrenous changes.  No history of infections of the lower extremities.  She cannot really not discern how far she can walk on thinking today in large part because her home and neighborhood are all very h[place] and hills make the walking much more difficult.  We performed noninvasive studies today which showed a right ABI of 0.81 with biphasic waveforms.  Her left ABI was 0.69 with monophasic waveforms.     Past Medical History:  Diagnosis Date   Anxiety    Cholelithiasis 09/2017   Dysrhythmia 2019   skipping a beat with palpatations   GERD (gastroesophageal reflux disease)    food gets "stuck" like an esophagitis   Hypertension    Insomnia    Migraines    magnesium helps   NAFL (nonalcoholic fatty liver)    Restless leg syndrome    uses heating pad to resolve   Sciatica 2019   pt is in a good place after getting an SI joint injection    Past Surgical History:  Procedure Laterality Date   CHOLECYSTECTOMY N/A 10/03/2017   Procedure: LAPAROSCOPIC CHOLECYSTECTOMY;  Surgeon: Carolan Shiver, MD;  Location: [facility] ORS;  Service: General;  Laterality: N/A;   COLONOSCOPY     TONSILLECTOMY     TUBAL LIGATION    Repair of left femur fracture   Family History  Problem Relation Age of Onset    Pneumonia Mother    Hypertension Mother    Heart attack Father 62  No bleeding or clotting disorders   Social History   Tobacco Use   Smoking status: Former    Packs/day: 0.50    Types: E-cigarettes, Cigarettes    Quit date: 08/2019    Years since quitting: 2.5   Smokeless tobacco: Never  Vaping Use   Vaping Use: Every day   Devices: e-cigarette  Substance Use Topics   Alcohol use: No    Comment: can not tolerate alchohol   Drug use: No     Allergies  Allergen Reactions   Calcium Channel Blockers Shortness Of Breath, Anxiety, Palpitations and Other (See Comments)    Cognitive effects also--difficulty thinking   Hydrochlorothiazide Shortness Of Breath   Sodium Metabisulfite Anaphylaxis    Sulfite--in foods   Sulfites Swelling    Can't breathe   Telmisartan-Hctz Shortness Of Breath    Chest pain & fatigue    Cardizem [Diltiazem Hcl] Other (See Comments)    Difficulty eating, sleeping, and concentrating Heart goes crazy    Sulfa Antibiotics Hives   Acyclovir And Related    Lexapro [Escitalopram Oxalate] Other (See Comments)    Gi upset    Current Outpatient Medications  Medication Sig Dispense Refill   atenolol (TENORMIN) 100 MG tablet  Take 100 mg by mouth daily. In the morning     atorvastatin (LIPITOR) 20 MG tablet Take 20 mg by mouth daily at 6 PM. Patient only takes half a tablet most times     B Complex-C (SUPER B COMPLEX PO) Take 1 capsule by mouth daily.     Chlorpheniramine-DM (CORICIDIN HBP COUGH/COLD PO) Take 1 tablet by mouth 2 (two) times daily as needed (for cold symptoms.).     chlorthalidone (HYGROTON) 25 MG tablet Take 25 mg by mouth daily.     Magnesium 500 MG TABS Take 500 mg by mouth daily as needed (for migraine headaches.).     valsartan (DIOVAN) 160 MG tablet Take 160 mg by mouth daily.     diazepam (VALIUM) 2 MG tablet Take 2 mg by mouth at bedtime. (Patient not taking: Reported on 03/14/2022)  3   fluticasone (FLONASE) 50 MCG/ACT nasal  spray Place 1-2 sprays into both nostrils daily as needed for allergies. (Patient not taking: Reported on 03/14/2022)     No current facility-administered medications for this visit.      REVIEW OF SYSTEMS (Negative unless checked)  Constitutional: [] Weight loss  [] Fever  [] Chills Cardiac: [] Chest pain   [] Chest pressure   [] Palpitations   [] Shortness of breath when laying flat   [] Shortness of breath at rest   [] Shortness of breath with exertion. Vascular:  [x] Pain in legs with walking   [] Pain in legs at rest   [] Pain in legs when laying flat   [x] Claudication   [] Pain in feet when walking  [] Pain in feet at rest  [] Pain in feet when laying flat   [] History of DVT   [] Phlebitis   [] Swelling in legs   [] Varicose veins   [] Non-healing ulcers Pulmonary:   [] Uses home oxygen   [] Productive cough   [] Hemoptysis   [] Wheeze  [] COPD   [] Asthma Neurologic:  [] Dizziness  [] Blackouts   [] Seizures   [] History of stroke   [] History of TIA  [] Aphasia   [] Temporary blindness   [] Dysphagia   [] Weakness or numbness in arms   [x] Weakness or numbness in legs Musculoskeletal:  [x] Arthritis   [] Joint swelling   [x] Joint pain   [] Low back pain Hematologic:  [] Easy bruising  [] Easy bleeding   [] Hypercoagulable state   [] Anemic  [] Hepatitis Gastrointestinal:  [] Blood in stool   [] Vomiting blood  [x] Gastroesophageal reflux/heartburn   [] Abdominal pain Genitourinary:  [] Chronic kidney disease   [] Difficult urination  [] Frequent urination  [] Burning with urination   [] Hematuria Skin:  [] Rashes   [] Ulcers   [] Wounds Psychological:  [x] History of anxiety   []  History of major depression.    Physical Exam BP (!) 147/80 (BP Location: Left Arm)   Pulse (!) 56   Resp 14   Ht 5\' 3"  (1.6 m)   Wt 126 lb (57.2 kg)   BMI 22.32 kg/m  Gen:  WD/WN, NAD.  Appears younger than stated age Head: [place]/AT, No temporalis wasting.  Ear/Nose/Throat: Hearing grossly intact, nares w/o erythema or drainage, oropharynx w/o  Erythema/Exudate Eyes: Conjunctiva clear, sclera non-icteric  Neck: trachea midline.  No JVD.  Pulmonary:  Good air movement, respirations not labored, no use of accessory muscles  Cardiac: RRR, no JVD Vascular:  Vessel Right Left  Radial Palpable Palpable                          PT 1+ 1+  DP 1+ 1+   Gastrointestinal:. No masses, surgical incisions,  or scars. Musculoskeletal: M/S 5/5 throughout.  Extremities without ischemic changes.  No deformity or atrophy.  No significant lower extremity edema. Neurologic: Sensation grossly intact in extremities.  Symmetrical.  Speech is fluent. Motor exam as listed above. Psychiatric: Judgment intact, Mood & affect appropriate for pt's clinical situation. Dermatologic: No rashes or ulcers noted.  No cellulitis or open wounds.    Radiology No results found.  Labs No results found for this or any previous visit (from the past 2160 hour(s)).  Assessment/Plan:  Atherosclerosis of native arteries of extremity with intermittent claudication (HCC) We performed noninvasive studies today which showed a right ABI of 0.81 with biphasic waveforms.  Her left ABI was 0.69 with monophasic waveforms.    Recommend:  The patient has evidence of atherosclerosis of the lower extremities with claudication.  The patient does not voice lifestyle limiting changes at this point in time.  She would prefer a trial of conservative management over at this time which I think is very reasonable.  She certainly does not have any limb threatening symptoms currently.  No invasive studies, angiography or surgery at this time The patient should continue walking and begin a more formal exercise program.  The patient should continue antiplatelet therapy and aggressive treatment of the lipid abnormalities  No changes in the patient's medications at this time  Continued surveillance is indicated as atherosclerosis is likely to progress with time.    The patient will  continue follow up with noninvasive studies as ordered.   Essential hypertension blood pressure control important in reducing the progression of atherosclerotic disease. On appropriate oral medications.   HLD (hyperlipidemia) lipid control important in reducing the progression of atherosclerotic disease. Continue statin therapy   Prediabetes blood glucose control important in reducing the progression of atherosclerotic disease. Also, involved in wound healing. On appropriate medications.      Festus Barren 03/14/2022, 10:24 AM   This note was created with Dragon medical transcription system.  Any errors from dictation are unintentional.

## 2022-03-14 NOTE — Assessment & Plan Note (Signed)
lipid control important in reducing the progression of atherosclerotic disease. Continue statin therapy  

## 2022-03-14 NOTE — Patient Instructions (Signed)
Peripheral Vascular Disease  Peripheral vascular disease (PVD) is a disease of the blood vessels that carry blood from the heart to the rest of the body. PVD is also called peripheral artery disease (PAD) or poor circulation. PVD affects most of the body. But it affects the legs and feet the most. PVD can lead to acute limb ischemia. This happens when there is a sudden stop of blood flow to an arm or leg. This is a medical emergency. What are the causes? The most common cause of PVD is a buildup of a fatty substance (plaque) inside your arteries. This decreases blood flow. Plaque can break off and block blood in a smaller artery. This can lead to acute limb ischemia. Other common causes of PVD include: Blood clots inside the blood vessels. Injuries to blood vessels. Irritation and swelling of blood vessels. Sudden tightening of the blood vessel (spasms). What increases the risk? A family history of PVD. Medical conditions, including: High cholesterol. Diabetes. High blood pressure. Heart disease. Past problems with blood clots. Past injury, such as burns or a broken bone. Other conditions, such as: Buerger's disease. This is caused by swollen or irritated blood vessels in your hands and feet. Arthritis. Birth defects that affect the arteries in your legs. Kidney disease. Using tobacco or nicotine products. Not getting enough exercise. Being very overweight (obese). Being 50 years old or older. What are the signs or symptoms? Cramps in your butt, legs, and feet. Pain and weakness in your legs when you are active that goes away when you rest. Leg pain when at rest. Leg numbness, tingling, or weakness. Coldness in a leg or foot, especially when compared with the other leg or foot. Skin or hair changes. These can include: Hair loss. Shiny skin. Pale or bluish skin. Thick toenails. Being unable to get or keep an erection. Tiredness (fatigue). Weak pulse or no pulse in the  feet. Wounds and sores on the toes, feet, or legs. These take longer to heal. How is this treated? Underlying causes are treated first. Other conditions, like diabetes, high cholesterol, and blood pressure, are also treated. Treatment may include: Lifestyle changes, such as: Quitting smoking. Getting regular exercise. Having a diet low in fat and cholesterol. Not drinking alcohol. Taking medicines, such as: Blood thinners. Medicines to improve blood flow. Medicines to improve your blood cholesterol. Procedures to: Open the arteries and restore blood flow. Insert a small mesh tube (stent) to keep a blocked vessel open. Create a new path for blood to flow to the body (peripheral bypass). Remove dead tissue from a wound. Remove an affected leg or arm. Follow these instructions at home: Medicines Take over-the-counter and prescription medicines only as told by your doctor. If you are taking blood thinners: Talk with your doctor before you take any medicines that have aspirin, or NSAIDs, such as ibuprofen. Take medicines exactly as told. Take them at the same time each day. Avoid doing things that could hurt or bruise you. Take action to prevent falls. Wear an alert bracelet or carry a card that shows you are taking blood thinners. Lifestyle     Get regular exercise. Ask your doctor about how to stay active. Talk with your doctor about keeping a healthy weight. If needed, ask about losing weight. Eat a diet that is low in fat and cholesterol. If you need help, talk with your doctor. Do not drink alcohol. Do not smoke or use any products that contain nicotine or tobacco. If you need help   quitting, ask your doctor. General instructions Take good care of your feet. To do this: Wear shoes that fit well and feel good. Check your feet often for any cuts or sores. Get a flu shot (influenza vaccine) each year. Keep all follow-up visits. Where to find more information Society for  Vascular Surgery: vascular.org American Heart Association: heart.org National Heart, Lung, and Blood Institute: nhlbi.nih.gov Contact a doctor if: You have cramps in your legs when you walk. You have leg pain when you rest. Your leg or foot feels cold. Your skin changes. You cannot get or keep an erection. You have cuts or sores on your legs or feet that do not heal. Get help right away if: You have sudden changes in the color and feeling of your arms or legs, such as: Your arm or leg turns cold, numb, and blue. Your arm or leg becomes red, warm, swollen, painful, or numb. You have any signs of a stroke. "BE FAST" is an easy way to remember the main warning signs: B - Balance. Dizziness, sudden trouble walking, or loss of balance. E - Eyes. Trouble seeing or a change in how you see. F - Face. Sudden weakness or loss of feeling of the face. The face or eyelid may droop on one side. A - Arms. Weakness or loss of feeling in an arm. This happens all of a sudden and most often on one side of the body. S - Speech. Sudden trouble speaking, slurred speech, or trouble understanding what people say. T - Time. Time to call emergency services. Write down what time symptoms started. You have other signs of a stroke, such as: A sudden, very bad headache with no known cause. Feeling like you may vomit (nausea). Vomiting. A seizure. You have chest pain or trouble breathing. These symptoms may be an emergency. Get help right away. Call your local emergency services (911 in the [place] Do not wait to see if the symptoms will go away. Do not drive yourself to the hospital. Summary Peripheral vascular disease (PVD) is a disease of the blood vessels. PVD affects the legs and feet the most. Symptoms may include leg pain or leg numbness, tingling, and weakness. Treatment may include lifestyle changes, medicines, and procedures. This information is not intended to replace advice given to you by your health  care provider. Make sure you discuss any questions you have with your health care provider. Document Revised: 01/05/2020 Document Reviewed: 01/05/2020 Elsevier Patient Education  2023 [employer].  

## 2022-03-14 NOTE — Assessment & Plan Note (Signed)
We performed noninvasive studies today which showed a right ABI of 0.81 with biphasic waveforms.  Her left ABI was 0.69 with monophasic waveforms.    Recommend:  The patient has evidence of atherosclerosis of the lower extremities with claudication.  The patient does not voice lifestyle limiting changes at this point in time.  She would prefer a trial of conservative management over at this time which I think is very reasonable.  She certainly does not have any limb threatening symptoms currently.  No invasive studies, angiography or surgery at this time The patient should continue walking and begin a more formal exercise program.  The patient should continue antiplatelet therapy and aggressive treatment of the lipid abnormalities  No changes in the patient's medications at this time  Continued surveillance is indicated as atherosclerosis is likely to progress with time.    The patient will continue follow up with noninvasive studies as ordered.

## 2022-03-14 NOTE — Assessment & Plan Note (Signed)
blood pressure control important in reducing the progression of atherosclerotic disease. On appropriate oral medications.  

## 2022-03-14 NOTE — Assessment & Plan Note (Signed)
blood glucose control important in reducing the progression of atherosclerotic disease. Also, involved in wound healing. On appropriate medications.  

## 2022-06-20 ENCOUNTER — Ambulatory Visit (INDEPENDENT_AMBULATORY_CARE_PROVIDER_SITE_OTHER): Payer: Medicare Other | Admitting: Vascular Surgery

## 2022-06-20 ENCOUNTER — Encounter (INDEPENDENT_AMBULATORY_CARE_PROVIDER_SITE_OTHER): Payer: Medicare Other

## 2023-02-21 ENCOUNTER — Ambulatory Visit (INDEPENDENT_AMBULATORY_CARE_PROVIDER_SITE_OTHER): Payer: Medicare Other

## 2023-02-21 ENCOUNTER — Encounter (INDEPENDENT_AMBULATORY_CARE_PROVIDER_SITE_OTHER): Payer: Self-pay | Admitting: Nurse Practitioner

## 2023-02-21 ENCOUNTER — Ambulatory Visit (INDEPENDENT_AMBULATORY_CARE_PROVIDER_SITE_OTHER): Payer: Medicare Other | Admitting: Nurse Practitioner

## 2023-02-21 DIAGNOSIS — I70212 Atherosclerosis of native arteries of extremities with intermittent claudication, left leg: Secondary | ICD-10-CM

## 2023-02-21 DIAGNOSIS — E785 Hyperlipidemia, unspecified: Secondary | ICD-10-CM

## 2023-02-21 DIAGNOSIS — I10 Essential (primary) hypertension: Secondary | ICD-10-CM | POA: Diagnosis not present

## 2023-02-22 ENCOUNTER — Encounter (INDEPENDENT_AMBULATORY_CARE_PROVIDER_SITE_OTHER): Payer: Self-pay | Admitting: Nurse Practitioner

## 2023-02-22 NOTE — Progress Notes (Signed)
Subjective:    Patient ID: Megan Irwin, female    DOB: 04-04-1946, 77 y.o.   MRN: [id number] Chief Complaint  Patient presents with   Follow-up    Evaluate left leg symptoms    Megan Irwin is a 77 year old female who returns today for follow-up of worsening claudication symptoms.  She notes that she was seen about a year ago and she has some known peripheral arterial disease.  She notes that during the middle of the summer where it was extremely hot her symptoms worsen but now they have calm down.  She notes that she is able to do things such as grocery shopping without significant difficulty.  She is able to walk on flat surfaces also with claudication but it is not lifestyle limiting.  Her claudication is not consistent with cramping but more so weakness with sustained activity.  She notes that her greatest struggle is walking up inclines or steps.  Despite this she is still not having any significant issues.  She denies the development of rest pain.  Currently there are no open wounds or ulcerations and she has full motor function of both legs.  She notes that her worst pain at night actually occurs in her left knee area following surgery several years ago.  She notes that this aches and throbs but her foot itself or the toes do not experience any difficulty through the evening.  Today noninvasive studies show an ABI of 0.61 on the right and 0.58 on the left.  This is a decrease from the previous studies done about a year ago which show an ABI of 0.81 on the right and 0.69 on the left.  The patient has monophasic tibial vessel waveforms bilaterally.  There is a 50 to 74% stenosis noted in the common iliac arteries bilaterally.  There is a small area of occlusion in the mid SFA but noted collateralization around the area.    Review of Systems  Cardiovascular:  Negative for leg swelling.       Claudication  Musculoskeletal:  Positive for arthralgias.  All other systems reviewed and are  negative.      Objective:   Physical Exam Vitals reviewed.  HENT:     Head: Normocephalic.  Cardiovascular:     Rate and Rhythm: Normal rate.     Pulses:          Dorsalis pedis pulses are detected w/ Doppler on the right side and detected w/ Doppler on the left side.       Posterior tibial pulses are detected w/ Doppler on the right side and detected w/ Doppler on the left side.  Pulmonary:     Effort: Pulmonary effort is normal.  Skin:    General: Skin is warm and dry.  Neurological:     Mental Status: She is alert and oriented to person, place, and time.  Psychiatric:        Mood and Affect: Mood normal.        Behavior: Behavior normal.        Thought Content: Thought content normal.        Judgment: Judgment normal.     BP (!) 184/78 (BP Location: Left Arm)   Pulse (!) 52   Resp 16   Wt 126 lb 3.2 oz (57.2 kg)   BMI 22.36 kg/m   Past Medical History:  Diagnosis Date   Anxiety    Cholelithiasis 09/2017   Dysrhythmia 2019   skipping a beat with palpatations  GERD (gastroesophageal reflux disease)    food gets "stuck" like an esophagitis   Hypertension    Insomnia    Migraines    magnesium helps   NAFL (nonalcoholic fatty liver)    Restless leg syndrome    uses heating pad to resolve   Sciatica 2019   pt is in a good place after getting an SI joint injection    Social History   Socioeconomic History   Marital status: Married    Spouse name: Not on file   Number of children: Not on file   Years of education: Not on file   Highest education level: Not on file  Occupational History   Not on file  Tobacco Use   Smoking status: Former    Current packs/day: 0.00    Types: E-cigarettes, Cigarettes    Quit date: 08/2019    Years since quitting: 3.5   Smokeless tobacco: Never  Vaping Use   Vaping status: Every Day   Devices: e-cigarette  Substance and Sexual Activity   Alcohol use: No    Comment: can not tolerate alchohol   Drug use: No   Sexual  activity: Not on file  Other Topics Concern   Not on file  Social History Narrative   Not on file   Social Determinants of Health   Financial Resource Strain: Not on file  Food Insecurity: Not on file  Transportation Needs: Not on file  Physical Activity: Not on file  Stress: Not on file  Social Connections: Not on file  Intimate Partner Violence: Not on file    Past Surgical History:  Procedure Laterality Date   CHOLECYSTECTOMY N/A 10/03/2017   Procedure: LAPAROSCOPIC CHOLECYSTECTOMY;  Surgeon: Carolan Shiver, MD;  Location: [facility] ORS;  Service: General;  Laterality: N/A;   COLONOSCOPY     TONSILLECTOMY     TUBAL LIGATION      Family History  Problem Relation Age of Onset   Pneumonia Mother    Hypertension Mother    Heart attack Father 71    Allergies  Allergen Reactions   Calcium Channel Blockers Shortness Of Breath, Anxiety, Palpitations and Other (See Comments)    Cognitive effects also--difficulty thinking   Hydrochlorothiazide Shortness Of Breath   Sodium Metabisulfite Anaphylaxis    Sulfite--in foods   Sulfites Swelling    Can't breathe   Telmisartan-Hctz Shortness Of Breath    Chest pain & fatigue    Cardizem [Diltiazem Hcl] Other (See Comments)    Difficulty eating, sleeping, and concentrating Heart goes crazy    Sulfa Antibiotics Hives   Acyclovir And Related    Lexapro [Escitalopram Oxalate] Other (See Comments)    Gi upset       Latest Ref Rng & Units 08/30/2017    4:44 PM 12/13/2016    1:33 PM  CBC  WBC 3.6 - 11.0 K/uL 8.9  7.5   Hemoglobin 12.0 - 16.0 g/dL 34.7  42.5   Hematocrit 35.0 - 47.0 % 43.9  42.8   Platelets 150 - 440 K/uL 233  245       CMP     Component Value Date/Time   NA 138 08/30/2017 1644   K 4.3 08/30/2017 1644   CL 103 08/30/2017 1644   CO2 27 08/30/2017 1644   GLUCOSE 109 (H) 08/30/2017 1644   BUN 14 08/30/2017 1644   CREATININE 0.70 08/30/2017 1644   CALCIUM 9.3 08/30/2017 1644   PROT 7.5 08/30/2017 1644    ALBUMIN 4.4 08/30/2017 1644  AST 22 08/30/2017 1644   ALT 22 08/30/2017 1644   ALKPHOS 117 08/30/2017 1644   BILITOT 0.7 08/30/2017 1644   GFRNONAA >60 08/30/2017 1644     No results found.     Assessment & Plan:   1. Atherosclerosis of native artery of left lower extremity with intermittent claudication (HCC) I had long discussion with the patient regarding her results today.  While her studies are definitely worsened and when she was here a year ago, she still is only experiencing claudication symptoms.  She does have pain in her left leg but it is primarily in her knee area and I suspect that this is mostly related to scar tissue and inflammation from the knee itself.  She does not describe any rest pain, ulcerations or any symptoms which are consistent with critical ischemia.  Based on this and the fact that she is only experiencing claudication that is not significantly debilitating to her day-to-day lifestyle, it is certainly prudent to proceed on a more conservative basis with close follow-up for the patient.  I did discuss that vascular issues however can change and if her symptoms and lead to change such as worsening of her claudication symptoms, the development of rest pain (which would include pain and cramping in her feet and toes when she is sleeping and resting only relieved by dangling from the side of the bed or getting out of the bed), sudden color or temperature changes of her legs or the development of wounds, the patient should contact us immediately for sooner follow-up.  Otherwise we will plan on having her follow-up in 6 months with noninvasive studies.  She is also advised to attempt to walk daily to assist with the improvement of claudication.  Is also advised to take an enteric-coated baby aspirin daily.  2. Essential hypertension Continue antihypertensive medications as already ordered, these medications have been reviewed and there are no changes at this time.  3.  Dyslipidemia Given the patient's vascular disease a statin would be helpful to try to slow progression.  She has been prescribed but has not taken it due to muscle weakness.  We discussed attempting to take half a tablet every other day to see how she tolerates this.  If not she will require some adjustments which we recommend she discuss with her PCP   Current Outpatient Medications on File Prior to Visit  Medication Sig Dispense Refill   atenolol (TENORMIN) 100 MG tablet Take 100 mg by mouth daily. In the morning     B Complex-C (SUPER B COMPLEX PO) Take 1 capsule by mouth daily.     gabapentin (NEURONTIN) 300 MG capsule Take 300 mg by mouth daily.     Magnesium 500 MG TABS Take 500 mg by mouth daily as needed (for migraine headaches.).     tizanidine (ZANAFLEX) 2 MG capsule Take 2 mg by mouth at bedtime as needed for muscle spasms. Take a 1/2 tablet     valsartan (DIOVAN) 160 MG tablet Take 160 mg by mouth daily.     atorvastatin (LIPITOR) 20 MG tablet Take 20 mg by mouth daily at 6 PM. Patient only takes half a tablet most times (Patient not taking: Reported on 02/21/2023)     Chlorpheniramine-DM (CORICIDIN HBP COUGH/COLD PO) Take 1 tablet by mouth 2 (two) times daily as needed (for cold symptoms.). (Patient not taking: Reported on 02/21/2023)     chlorthalidone (HYGROTON) 25 MG tablet Take 25 mg by mouth daily. (Patient not taking: Reported  on 02/21/2023)     diazepam (VALIUM) 2 MG tablet Take 2 mg by mouth at bedtime. (Patient not taking: Reported on 03/14/2022)  3   fluticasone (FLONASE) 50 MCG/ACT nasal spray Place 1-2 sprays into both nostrils daily as needed for allergies. (Patient not taking: Reported on 03/14/2022)     No current facility-administered medications on file prior to visit.    There are no Patient Instructions on file for this visit. No follow-ups on file.   Georgiana Spinner, NP

## 2023-04-06 IMAGING — CT CT FEMUR *L* W/O CM
3 of 6 series · 11 of 33 positions shown, 13 images · non-contrast
Comparison: MRI left knee dated November 28, 2020.

CLINICAL DATA: Medial knee pain. History of femur fracture status
post ORIF.

EXAM:
CT OF THE LOWER LEFT EXTREMITY WITHOUT CONTRAST
TECHNIQUE: Multidetector CT imaging of the lower left extremity was performed
according to the standard protocol.

[Series 3: axial bone lfov lower extremity 1.50 ax · axial · 0.30mm/px · z∈[-1735,-1403]mm · 5 of 626 slices shown, 7 images]
[im 105/626  soft-tissue]
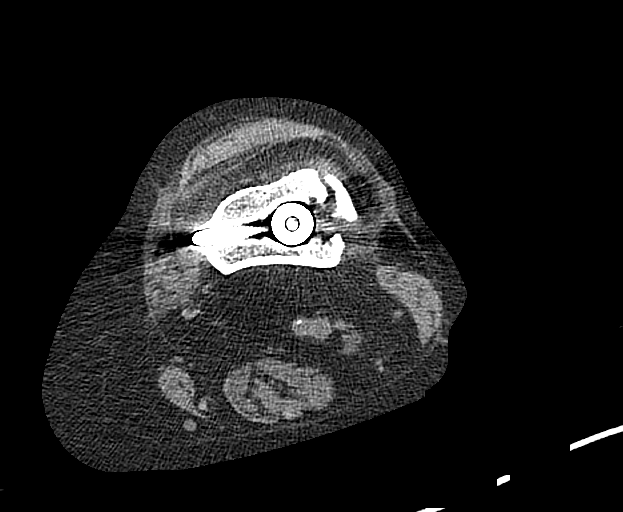
[im 105/626  bone]
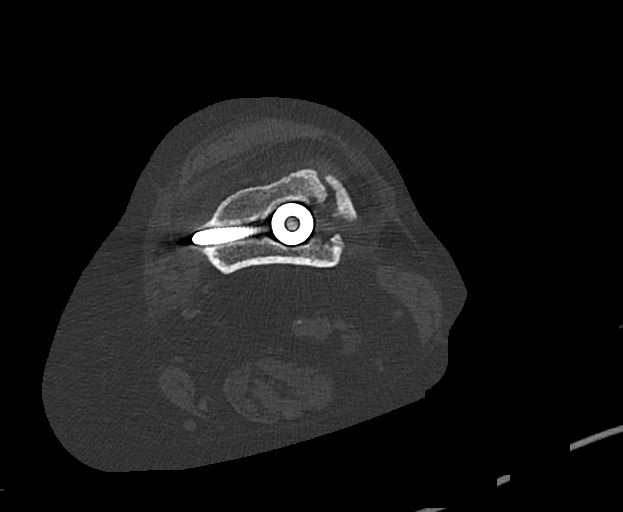
[im 209/626  bone]
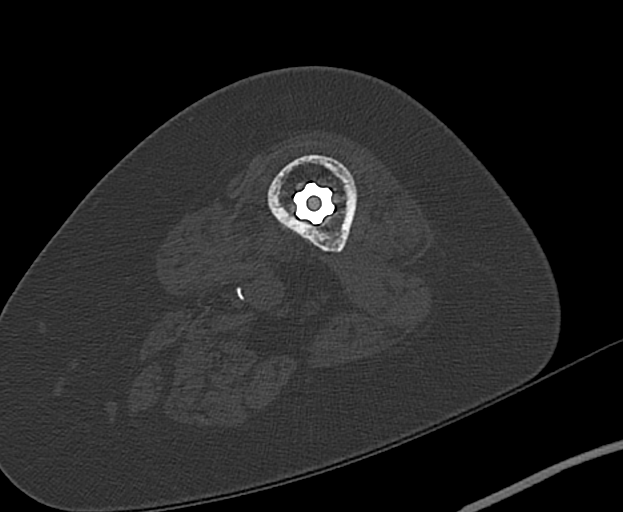
[im 313/626  bone]
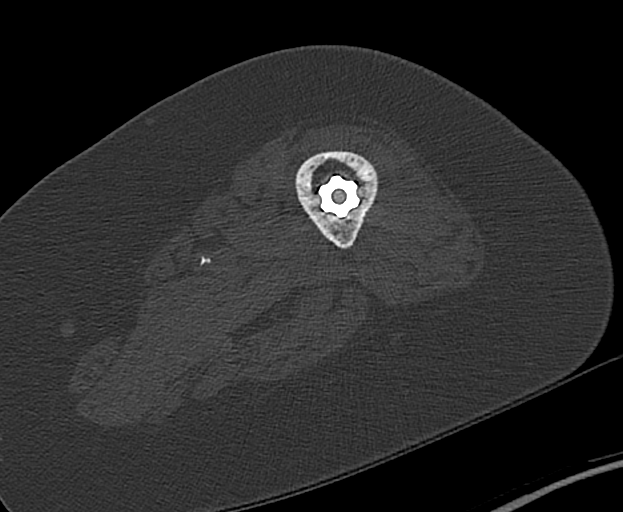
[im 417/626  bone]
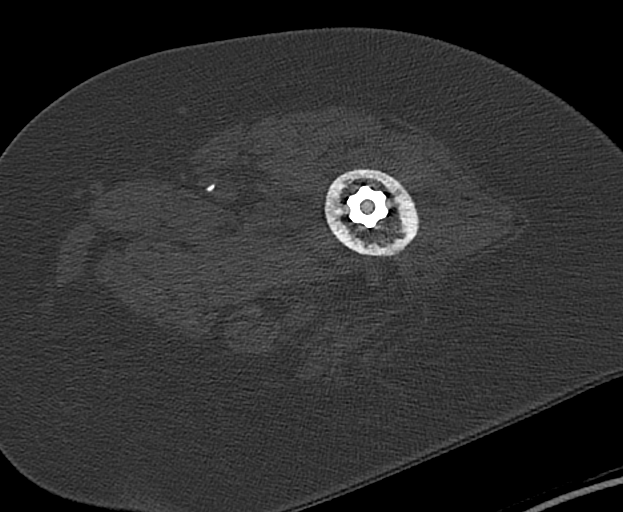
[im 521/626  soft-tissue]
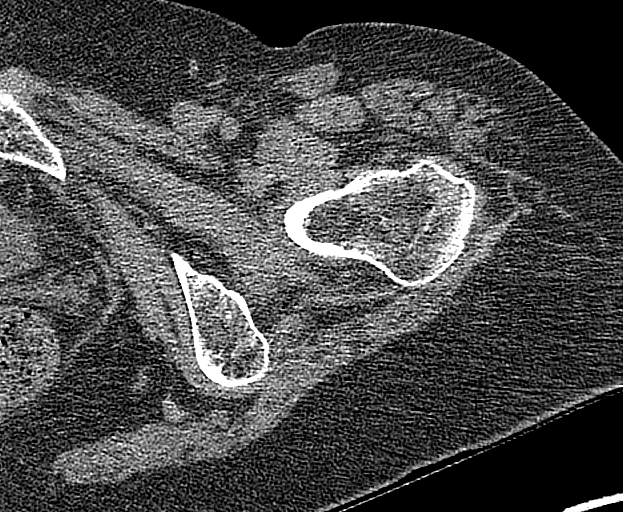
[im 521/626  bone]
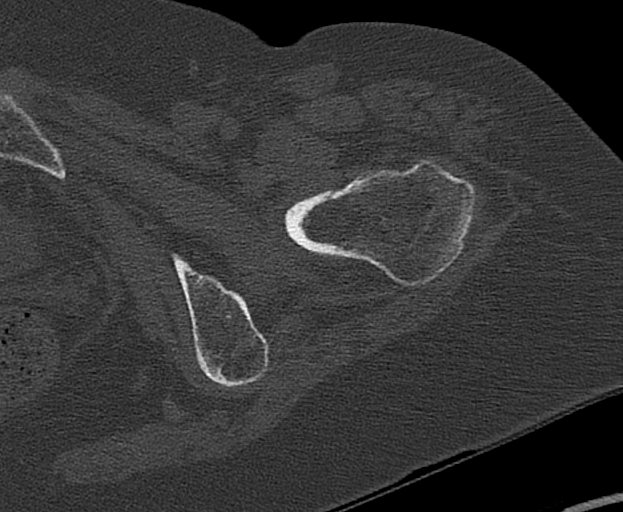

[Series 5: axial st lfov lower extremity 1.50 ax · axial · 0.30mm/px · z∈[-1735,-1403]mm · 5 of 626 slices shown]
[im 105/626  bone]
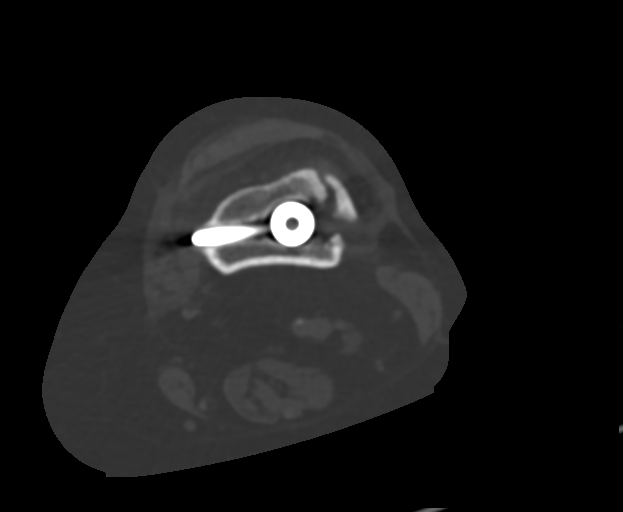
[im 209/626  bone]
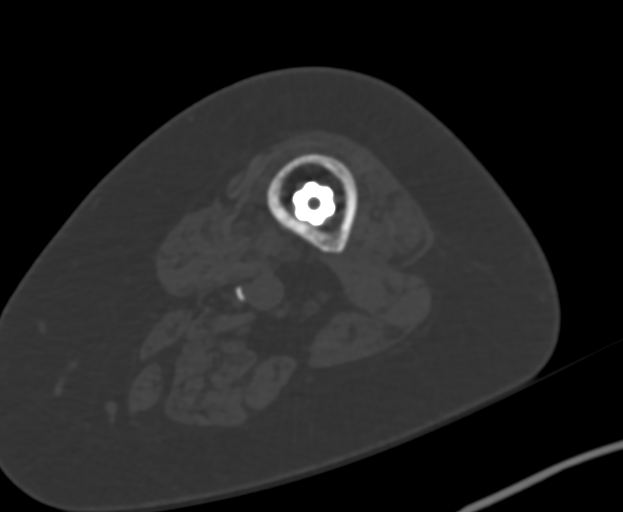
[im 313/626  bone]
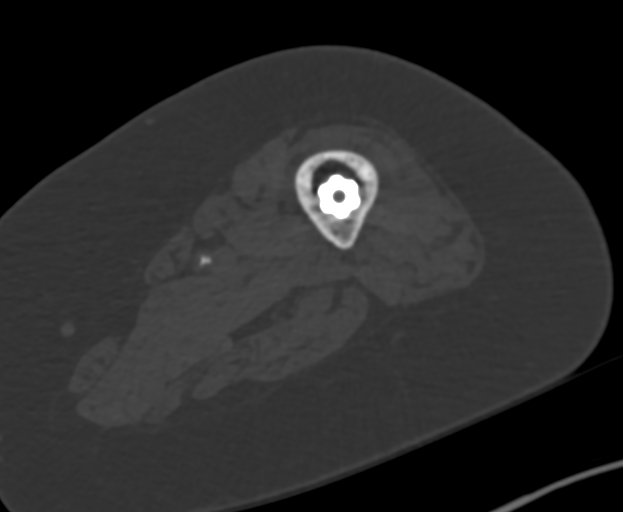
[im 417/626  bone]
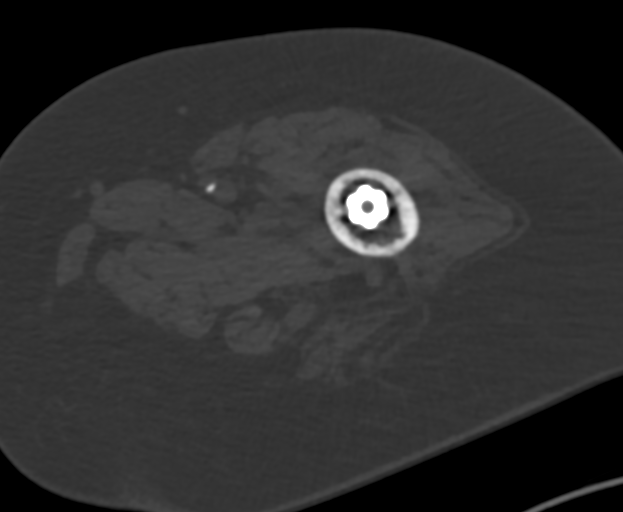
[im 521/626  bone]
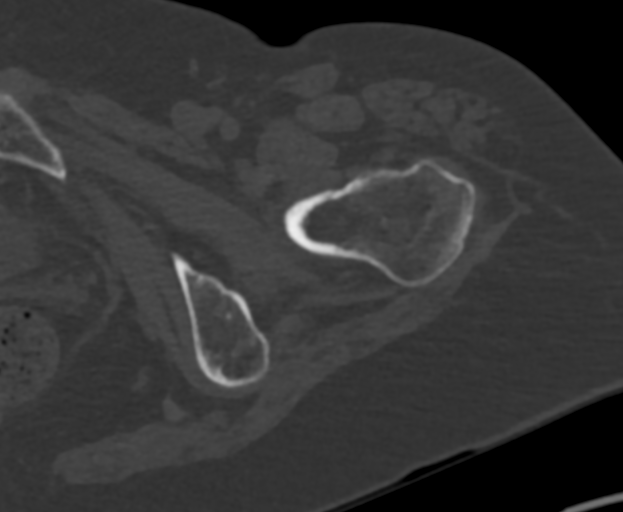

[Series 9: coronal bone lfov lower extremity 1.50 cor · coronal · 0.37mm/px · 1 of 192 slices shown]
[im 96/192  bone]
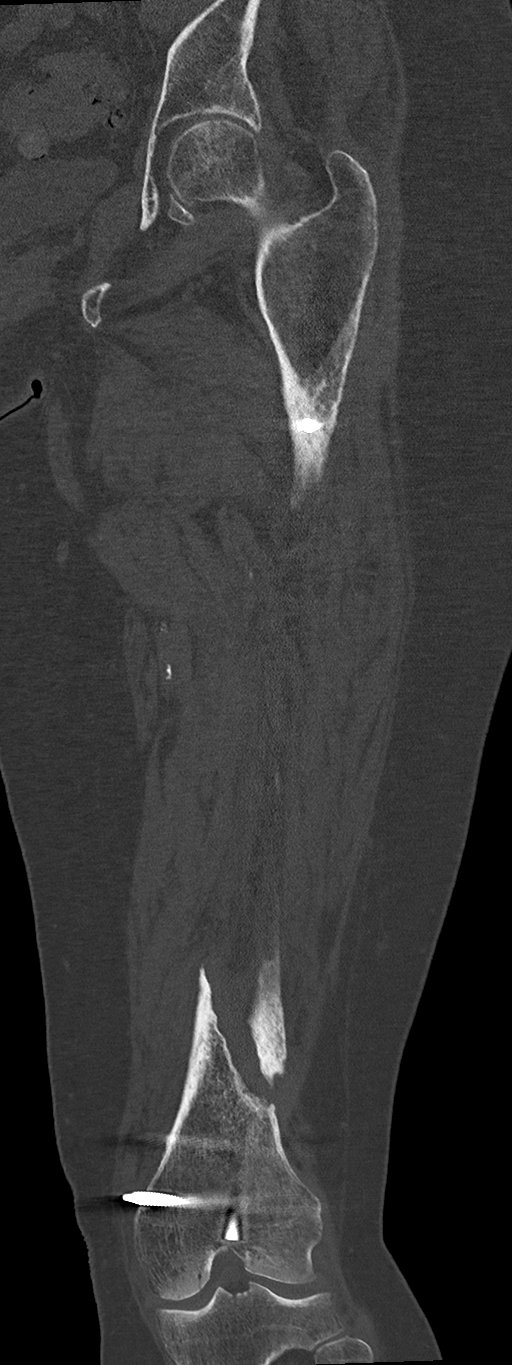

[11 of 33 positions shown; findings below may reference images not displayed]

FINDINGS: Bones/Joint/Cartilage

Chronic nonunited oblique fracture of the distal femoral
metadiaphysis status post intramedullary rod fixation. Minimal
callus formation without bridging bone. There is some lucency along
the more proximal distal interlocking screw as well as the medial
aspect of the intramedullary rod below the fracture line (series 9,
image 87) the left knee and hip joint spaces are preserved. No joint
effusion.

Ligaments

Ligaments are suboptimally evaluated by CT.

Muscles and Tendons
Grossly intact.

Soft tissue
No fluid collection or hematoma.  No soft tissue mass.
IMPRESSION: 1. Chronic nonunited distal femur fracture status post
intramedullary rod fixation. Minimal callus formation without
bridging bone.
2. Lucency along the more proximal distal interlocking screw as well
as the medial aspect of the intramedullary rod below the fracture
line, concerning for loosening.

## 2023-08-21 ENCOUNTER — Other Ambulatory Visit (INDEPENDENT_AMBULATORY_CARE_PROVIDER_SITE_OTHER): Payer: Self-pay | Admitting: Nurse Practitioner

## 2023-08-21 DIAGNOSIS — I70212 Atherosclerosis of native arteries of extremities with intermittent claudication, left leg: Secondary | ICD-10-CM

## 2023-08-24 ENCOUNTER — Ambulatory Visit (INDEPENDENT_AMBULATORY_CARE_PROVIDER_SITE_OTHER): Payer: Medicare Other

## 2023-08-24 ENCOUNTER — Ambulatory Visit (INDEPENDENT_AMBULATORY_CARE_PROVIDER_SITE_OTHER): Payer: Medicare Other | Admitting: Nurse Practitioner

## 2024-02-06 ENCOUNTER — Ambulatory Visit (INDEPENDENT_AMBULATORY_CARE_PROVIDER_SITE_OTHER): Admitting: Nurse Practitioner

## 2024-02-06 ENCOUNTER — Other Ambulatory Visit (INDEPENDENT_AMBULATORY_CARE_PROVIDER_SITE_OTHER)

## 2024-02-06 ENCOUNTER — Encounter (INDEPENDENT_AMBULATORY_CARE_PROVIDER_SITE_OTHER): Payer: Self-pay | Admitting: Nurse Practitioner

## 2024-02-06 VITALS — BP 138/75 | HR 60 | Resp 16 | Ht 63.0 in | Wt 117.2 lb

## 2024-02-06 DIAGNOSIS — E785 Hyperlipidemia, unspecified: Secondary | ICD-10-CM | POA: Diagnosis not present

## 2024-02-06 DIAGNOSIS — I10 Essential (primary) hypertension: Secondary | ICD-10-CM

## 2024-02-06 DIAGNOSIS — I70212 Atherosclerosis of native arteries of extremities with intermittent claudication, left leg: Secondary | ICD-10-CM

## 2024-02-06 NOTE — H&P (View-Only) (Signed)
 Subjective:    Patient ID: Megan Irwin, female    DOB: May 28, 1946, 78 y.o.   MRN: [id number] Chief Complaint  Patient presents with   Follow-up    6 month follow up ABI    Megan Irwin is a 78 year old female who returns today for follow-up of worsening claudication symptoms.  She notes that she was seen about a year ago and she has some known peripheral arterial disease.  She notes that since she was seen about a year ago she has been experiencing worsening claudication symptoms.  She does not have pain but notable weakness after walking short distances.  She notes that even walking between rooms of her home have become difficult for her.  This is certainly become lifestyle limiting for her at this point.  She still continues to endorse pain in her left knee following surgery years ago..  She denies rest pain or any open wounds or ulcerations.   Today noninvasive studies show an ABI of 0.55 on the right and 0.63 on the left.  She has a TBI of 0.27 bilaterally with monophasic waveforms and dampened toe waveforms bilaterally.  At her previous studies a year ago, there was a 50 to 74% stenosis noted in the common iliac arteries bilaterally.  There was a small area of occlusion in the mid SFA of the left lower extremity but noted collateralization around the area.     Review of Systems  Neurological:  Positive for weakness.  All other systems reviewed and are negative.      Objective:   Physical Exam Vitals reviewed.  HENT:     Head: Normocephalic.  Cardiovascular:     Rate and Rhythm: Normal rate.     Pulses:          Dorsalis pedis pulses are detected w/ Doppler on the right side and detected w/ Doppler on the left side.       Posterior tibial pulses are detected w/ Doppler on the right side and detected w/ Doppler on the left side.  Pulmonary:     Effort: Pulmonary effort is normal.  Skin:    General: Skin is warm and dry.  Neurological:     Mental Status: She is alert and  oriented to person, place, and time.  Psychiatric:        Mood and Affect: Mood normal.        Behavior: Behavior normal.        Thought Content: Thought content normal.        Judgment: Judgment normal.     BP 138/75 (BP Location: Left Arm, Patient Position: Sitting, Cuff Size: Small)   Pulse 60   Resp 16   Ht 5' 3 (1.6 m)   Wt 117 lb 3.2 oz (53.2 kg)   BMI 20.76 kg/m   Past Medical History:  Diagnosis Date   Anxiety    Cholelithiasis 09/2017   Dysrhythmia 2019   skipping a beat with palpatations   GERD (gastroesophageal reflux disease)    food gets stuck like an esophagitis   Hypertension    Insomnia    Migraines    magnesium helps   NAFL (nonalcoholic fatty liver)    Restless leg syndrome    uses heating pad to resolve   Sciatica 2019   pt is in a good place after getting an SI joint injection    Social History   Socioeconomic History   Marital status: Married    Spouse name: Not on file  Number of children: Not on file   Years of education: Not on file   Highest education level: Not on file  Occupational History   Not on file  Tobacco Use   Smoking status: Former    Current packs/day: 0.00    Types: E-cigarettes, Cigarettes    Quit date: 08/2019    Years since quitting: 4.4   Smokeless tobacco: Never  Vaping Use   Vaping status: Every Day   Devices: e-cigarette  Substance and Sexual Activity   Alcohol use: No    Comment: can not tolerate alchohol   Drug use: No   Sexual activity: Not on file  Other Topics Concern   Not on file  Social History Narrative   Not on file   Social Drivers of Health   Financial Resource Strain: Low Risk  (12/25/2023)   Received from [facility]   Overall Financial Resource Strain (CARDIA)    Difficulty of Paying Living Expenses: Not very hard  Food Insecurity: No Food Insecurity (12/25/2023)   Received from [facility]   Hunger Vital Sign    Within the past 12 months, you  worried that your food would run out before you got the money to buy more.: Never true    Within the past 12 months, the food you bought just didn't last and you didn't have money to get more.: Never true  Transportation Needs: No Transportation Needs (12/25/2023)   Received from [facility]    In the past 12 months, has lack of transportation kept you from medical appointments or from getting medications?: No    Lack of Transportation (Non-Medical): No  Physical Activity: Not on file  Stress: Not on file  Social Connections: Not on file  Intimate Partner Violence: Not on file    Past Surgical History:  Procedure Laterality Date   CHOLECYSTECTOMY N/A 10/03/2017   Procedure: LAPAROSCOPIC CHOLECYSTECTOMY;  Surgeon: Rodolph Romano, MD;  Location: [facility] ORS;  Service: General;  Laterality: N/A;   COLONOSCOPY     TONSILLECTOMY     TUBAL LIGATION      Family History  Problem Relation Age of Onset   Pneumonia Mother    Hypertension Mother    Heart attack Father 58    Allergies  Allergen Reactions   Calcium Channel Blockers Shortness Of Breath, Anxiety, Palpitations and Other (See Comments)    Cognitive effects also--difficulty thinking   Hydrochlorothiazide Shortness Of Breath   Sodium Metabisulfite Anaphylaxis    Sulfite--in foods   Sulfites Swelling    Can't breathe   Telmisartan-Hctz Shortness Of Breath    Chest pain & fatigue    Cardizem [Diltiazem Hcl] Other (See Comments)    Difficulty eating, sleeping, and concentrating Heart goes crazy    Sulfa Antibiotics Hives   Acyclovir And Related    Lexapro [Escitalopram Oxalate] Other (See Comments)    Gi upset       Latest Ref Rng & Units 08/30/2017    4:44 PM 12/13/2016    1:33 PM  CBC  WBC 3.6 - 11.0 K/uL 8.9  7.5   Hemoglobin 12.0 - 16.0 g/dL 85.0  85.6   Hematocrit 35.0 - 47.0 % 43.9  42.8   Platelets 150 - 440 K/uL 233  245       CMP     Component Value  Date/Time   NA 138 08/30/2017 1644   K 4.3 08/30/2017 1644   CL 103  08/30/2017 1644   CO2 27 08/30/2017 1644   GLUCOSE 109 (H) 08/30/2017 1644   BUN 14 08/30/2017 1644   CREATININE 0.70 08/30/2017 1644   CALCIUM 9.3 08/30/2017 1644   PROT 7.5 08/30/2017 1644   ALBUMIN 4.4 08/30/2017 1644   AST 22 08/30/2017 1644   ALT 22 08/30/2017 1644   ALKPHOS 117 08/30/2017 1644   BILITOT 0.7 08/30/2017 1644   GFRNONAA >60 08/30/2017 1644     No results found.     Assessment & Plan:   1. Atherosclerosis of native artery of left lower extremity with intermittent claudication (HCC) (Primary) Recommend:  The patient has experienced increased claudication symptoms and is now describing lifestyle limiting claudication and appears to be having mild rest pain symptroms.  Given the severity of the patient's severe bilateral lower extremity symptoms the patient should undergo angiography with the hope for intervention.  Risk and benefits were reviewed the patient.  Indications for the procedure were reviewed.  All questions were answered, the patient agrees to proceed with left then right lower extremity angiography and possible intervention.   The patient should continue walking and begin a more formal exercise program.  The patient should continue antiplatelet therapy and aggressive treatment of the lipid abnormalities  The patient will follow up with me after the angiogram.   2. Essential hypertension Continue antihypertensive medications as already ordered, these medications have been reviewed and there are no changes at this time.  3. Hyperlipidemia, unspecified hyperlipidemia type Continue statin as ordered and reviewed, no changes at this time   Current Outpatient Medications on File Prior to Visit  Medication Sig Dispense Refill   atenolol (TENORMIN) 100 MG tablet Take 100 mg by mouth daily. In the morning     B Complex-C (SUPER B COMPLEX PO) Take 1 capsule by mouth daily.      Chlorpheniramine-DM (CORICIDIN HBP COUGH/COLD PO) Take 1 tablet by mouth 2 (two) times daily as needed (for cold symptoms.).     gabapentin (NEURONTIN) 300 MG capsule Take 300 mg by mouth daily.     Magnesium 500 MG TABS Take 500 mg by mouth daily as needed (for migraine headaches.).     tizanidine (ZANAFLEX) 2 MG capsule Take 2 mg by mouth at bedtime as needed for muscle spasms. Take a 1/2 tablet     valsartan (DIOVAN) 160 MG tablet Take 160 mg by mouth daily.     atorvastatin (LIPITOR) 20 MG tablet Take 20 mg by mouth daily at 6 PM. Patient only takes half a tablet most times (Patient not taking: Reported on 02/21/2023)     chlorthalidone (HYGROTON) 25 MG tablet Take 25 mg by mouth daily. (Patient not taking: Reported on 02/21/2023)     diazepam  (VALIUM ) 2 MG tablet Take 2 mg by mouth at bedtime. (Patient not taking: Reported on 03/14/2022)  3   fluticasone (FLONASE) 50 MCG/ACT nasal spray Place 1-2 sprays into both nostrils daily as needed for allergies. (Patient not taking: Reported on 03/14/2022)     No current facility-administered medications on file prior to visit.    There are no Patient Instructions on file for this visit. No follow-ups on file.   Andrus Sharp E Sarafina Puthoff, NP

## 2024-02-06 NOTE — Progress Notes (Signed)
 Subjective:    Patient ID: Megan Irwin, female    DOB: May 28, 1946, 78 y.o.   MRN: [id number] Chief Complaint  Patient presents with   Follow-up    6 month follow up ABI    Megan Irwin is a 78 year old female who returns today for follow-up of worsening claudication symptoms.  She notes that she was seen about a year ago and she has some known peripheral arterial disease.  She notes that since she was seen about a year ago she has been experiencing worsening claudication symptoms.  She does not have pain but notable weakness after walking short distances.  She notes that even walking between rooms of her home have become difficult for her.  This is certainly become lifestyle limiting for her at this point.  She still continues to endorse pain in her left knee following surgery years ago..  She denies rest pain or any open wounds or ulcerations.   Today noninvasive studies show an ABI of 0.55 on the right and 0.63 on the left.  She has a TBI of 0.27 bilaterally with monophasic waveforms and dampened toe waveforms bilaterally.  At her previous studies a year ago, there was a 50 to 74% stenosis noted in the common iliac arteries bilaterally.  There was a small area of occlusion in the mid SFA of the left lower extremity but noted collateralization around the area.     Review of Systems  Neurological:  Positive for weakness.  All other systems reviewed and are negative.      Objective:   Physical Exam Vitals reviewed.  HENT:     Head: Normocephalic.  Cardiovascular:     Rate and Rhythm: Normal rate.     Pulses:          Dorsalis pedis pulses are detected w/ Doppler on the right side and detected w/ Doppler on the left side.       Posterior tibial pulses are detected w/ Doppler on the right side and detected w/ Doppler on the left side.  Pulmonary:     Effort: Pulmonary effort is normal.  Skin:    General: Skin is warm and dry.  Neurological:     Mental Status: She is alert and  oriented to person, place, and time.  Psychiatric:        Mood and Affect: Mood normal.        Behavior: Behavior normal.        Thought Content: Thought content normal.        Judgment: Judgment normal.     BP 138/75 (BP Location: Left Arm, Patient Position: Sitting, Cuff Size: Small)   Pulse 60   Resp 16   Ht 5' 3 (1.6 m)   Wt 117 lb 3.2 oz (53.2 kg)   BMI 20.76 kg/m   Past Medical History:  Diagnosis Date   Anxiety    Cholelithiasis 09/2017   Dysrhythmia 2019   skipping a beat with palpatations   GERD (gastroesophageal reflux disease)    food gets stuck like an esophagitis   Hypertension    Insomnia    Migraines    magnesium helps   NAFL (nonalcoholic fatty liver)    Restless leg syndrome    uses heating pad to resolve   Sciatica 2019   pt is in a good place after getting an SI joint injection    Social History   Socioeconomic History   Marital status: Married    Spouse name: Not on file  Number of children: Not on file   Years of education: Not on file   Highest education level: Not on file  Occupational History   Not on file  Tobacco Use   Smoking status: Former    Current packs/day: 0.00    Types: E-cigarettes, Cigarettes    Quit date: 08/2019    Years since quitting: 4.4   Smokeless tobacco: Never  Vaping Use   Vaping status: Every Day   Devices: e-cigarette  Substance and Sexual Activity   Alcohol use: No    Comment: can not tolerate alchohol   Drug use: No   Sexual activity: Not on file  Other Topics Concern   Not on file  Social History Narrative   Not on file   Social Drivers of Health   Financial Resource Strain: Low Risk  (12/25/2023)   Received from [facility]   Overall Financial Resource Strain (CARDIA)    Difficulty of Paying Living Expenses: Not very hard  Food Insecurity: No Food Insecurity (12/25/2023)   Received from [facility]   Hunger Vital Sign    Within the past 12 months, you  worried that your food would run out before you got the money to buy more.: Never true    Within the past 12 months, the food you bought just didn't last and you didn't have money to get more.: Never true  Transportation Needs: No Transportation Needs (12/25/2023)   Received from [facility]    In the past 12 months, has lack of transportation kept you from medical appointments or from getting medications?: No    Lack of Transportation (Non-Medical): No  Physical Activity: Not on file  Stress: Not on file  Social Connections: Not on file  Intimate Partner Violence: Not on file    Past Surgical History:  Procedure Laterality Date   CHOLECYSTECTOMY N/A 10/03/2017   Procedure: LAPAROSCOPIC CHOLECYSTECTOMY;  Surgeon: Rodolph Romano, MD;  Location: [facility] ORS;  Service: General;  Laterality: N/A;   COLONOSCOPY     TONSILLECTOMY     TUBAL LIGATION      Family History  Problem Relation Age of Onset   Pneumonia Mother    Hypertension Mother    Heart attack Father 58    Allergies  Allergen Reactions   Calcium Channel Blockers Shortness Of Breath, Anxiety, Palpitations and Other (See Comments)    Cognitive effects also--difficulty thinking   Hydrochlorothiazide Shortness Of Breath   Sodium Metabisulfite Anaphylaxis    Sulfite--in foods   Sulfites Swelling    Can't breathe   Telmisartan-Hctz Shortness Of Breath    Chest pain & fatigue    Cardizem [Diltiazem Hcl] Other (See Comments)    Difficulty eating, sleeping, and concentrating Heart goes crazy    Sulfa Antibiotics Hives   Acyclovir And Related    Lexapro [Escitalopram Oxalate] Other (See Comments)    Gi upset       Latest Ref Rng & Units 08/30/2017    4:44 PM 12/13/2016    1:33 PM  CBC  WBC 3.6 - 11.0 K/uL 8.9  7.5   Hemoglobin 12.0 - 16.0 g/dL 85.0  85.6   Hematocrit 35.0 - 47.0 % 43.9  42.8   Platelets 150 - 440 K/uL 233  245       CMP     Component Value  Date/Time   NA 138 08/30/2017 1644   K 4.3 08/30/2017 1644   CL 103  08/30/2017 1644   CO2 27 08/30/2017 1644   GLUCOSE 109 (H) 08/30/2017 1644   BUN 14 08/30/2017 1644   CREATININE 0.70 08/30/2017 1644   CALCIUM 9.3 08/30/2017 1644   PROT 7.5 08/30/2017 1644   ALBUMIN 4.4 08/30/2017 1644   AST 22 08/30/2017 1644   ALT 22 08/30/2017 1644   ALKPHOS 117 08/30/2017 1644   BILITOT 0.7 08/30/2017 1644   GFRNONAA >60 08/30/2017 1644     No results found.     Assessment & Plan:   1. Atherosclerosis of native artery of left lower extremity with intermittent claudication (HCC) (Primary) Recommend:  The patient has experienced increased claudication symptoms and is now describing lifestyle limiting claudication and appears to be having mild rest pain symptroms.  Given the severity of the patient's severe bilateral lower extremity symptoms the patient should undergo angiography with the hope for intervention.  Risk and benefits were reviewed the patient.  Indications for the procedure were reviewed.  All questions were answered, the patient agrees to proceed with left then right lower extremity angiography and possible intervention.   The patient should continue walking and begin a more formal exercise program.  The patient should continue antiplatelet therapy and aggressive treatment of the lipid abnormalities  The patient will follow up with me after the angiogram.   2. Essential hypertension Continue antihypertensive medications as already ordered, these medications have been reviewed and there are no changes at this time.  3. Hyperlipidemia, unspecified hyperlipidemia type Continue statin as ordered and reviewed, no changes at this time   Current Outpatient Medications on File Prior to Visit  Medication Sig Dispense Refill   atenolol (TENORMIN) 100 MG tablet Take 100 mg by mouth daily. In the morning     B Complex-C (SUPER B COMPLEX PO) Take 1 capsule by mouth daily.      Chlorpheniramine-DM (CORICIDIN HBP COUGH/COLD PO) Take 1 tablet by mouth 2 (two) times daily as needed (for cold symptoms.).     gabapentin (NEURONTIN) 300 MG capsule Take 300 mg by mouth daily.     Magnesium 500 MG TABS Take 500 mg by mouth daily as needed (for migraine headaches.).     tizanidine (ZANAFLEX) 2 MG capsule Take 2 mg by mouth at bedtime as needed for muscle spasms. Take a 1/2 tablet     valsartan (DIOVAN) 160 MG tablet Take 160 mg by mouth daily.     atorvastatin (LIPITOR) 20 MG tablet Take 20 mg by mouth daily at 6 PM. Patient only takes half a tablet most times (Patient not taking: Reported on 02/21/2023)     chlorthalidone (HYGROTON) 25 MG tablet Take 25 mg by mouth daily. (Patient not taking: Reported on 02/21/2023)     diazepam  (VALIUM ) 2 MG tablet Take 2 mg by mouth at bedtime. (Patient not taking: Reported on 03/14/2022)  3   fluticasone (FLONASE) 50 MCG/ACT nasal spray Place 1-2 sprays into both nostrils daily as needed for allergies. (Patient not taking: Reported on 03/14/2022)     No current facility-administered medications on file prior to visit.    There are no Patient Instructions on file for this visit. No follow-ups on file.   Andrus Sharp E Sarafina Puthoff, NP

## 2024-02-07 ENCOUNTER — Telehealth (INDEPENDENT_AMBULATORY_CARE_PROVIDER_SITE_OTHER): Payer: Self-pay

## 2024-02-07 NOTE — Telephone Encounter (Signed)
 Spoke with the patient and she is scheduled for a LLE (02/25/24 - 12:15 pm arrival) and RLE (03/03/24 - 6:45 am) angio to the [facility]. Pre-procedure instructions were discussed and will be mailed.

## 2024-02-11 LAB — VAS US ABI WITH/WO TBI
Left ABI: 0.63
Right ABI: 0.55

## 2024-02-25 ENCOUNTER — Encounter
Admission: RE | Disposition: A | Discharge status: Home / Self Care | Payer: Self-pay | Source: Home / Self Care | Attending: Vascular Surgery

## 2024-02-25 ENCOUNTER — Other Ambulatory Visit: Payer: Self-pay

## 2024-02-25 ENCOUNTER — Telehealth (INDEPENDENT_AMBULATORY_CARE_PROVIDER_SITE_OTHER): Payer: Self-pay

## 2024-02-25 ENCOUNTER — Encounter: Payer: Self-pay | Admitting: Vascular Surgery

## 2024-02-25 ENCOUNTER — Ambulatory Visit
Admission: RE | Admit: 2024-02-25 | Discharge: 2024-02-25 | Disposition: A | Discharge status: Home / Self Care | Attending: Vascular Surgery | Admitting: Vascular Surgery

## 2024-02-25 DIAGNOSIS — E785 Hyperlipidemia, unspecified: Secondary | ICD-10-CM | POA: Insufficient documentation

## 2024-02-25 DIAGNOSIS — I70212 Atherosclerosis of native arteries of extremities with intermittent claudication, left leg: Secondary | ICD-10-CM | POA: Diagnosis present

## 2024-02-25 DIAGNOSIS — F17290 Nicotine dependence, other tobacco product, uncomplicated: Secondary | ICD-10-CM | POA: Insufficient documentation

## 2024-02-25 DIAGNOSIS — Z79899 Other long term (current) drug therapy: Secondary | ICD-10-CM | POA: Insufficient documentation

## 2024-02-25 DIAGNOSIS — I70213 Atherosclerosis of native arteries of extremities with intermittent claudication, bilateral legs: Secondary | ICD-10-CM | POA: Diagnosis not present

## 2024-02-25 DIAGNOSIS — I10 Essential (primary) hypertension: Secondary | ICD-10-CM | POA: Insufficient documentation

## 2024-02-25 DIAGNOSIS — Z7902 Long term (current) use of antithrombotics/antiplatelets: Secondary | ICD-10-CM | POA: Insufficient documentation

## 2024-02-25 DIAGNOSIS — I70219 Atherosclerosis of native arteries of extremities with intermittent claudication, unspecified extremity: Secondary | ICD-10-CM

## 2024-02-25 HISTORY — PX: LOWER EXTREMITY ANGIOGRAPHY: CATH118251

## 2024-02-25 LAB — CREATININE, SERUM
Creatinine, Ser: 1 mg/dL (ref 0.44–1.00)
GFR, Estimated: 57 mL/min — ABNORMAL LOW (ref >60)

## 2024-02-25 LAB — BUN: BUN: 25 mg/dL — ABNORMAL HIGH (ref 8–23)

## 2024-02-25 SURGERY — LOWER EXTREMITY ANGIOGRAPHY
Anesthesia: Moderate Sedation | Site: Leg Lower | Laterality: Left

## 2024-02-25 MED ORDER — NITROGLYCERIN 1 MG/10 ML FOR IR/CATH LAB
INTRA_ARTERIAL | Status: DC | PRN
  Administered 2024-02-25: 400 ug
  Administered 2024-02-25: 200 ug
  Administered 2024-02-25: 400 ug
  Administered 2024-02-25: 200 ug

## 2024-02-25 MED ORDER — METHYLPREDNISOLONE SODIUM SUCC 125 MG IJ SOLR: 125.0000 mg | Freq: Once | INTRAMUSCULAR | Status: DC | PRN

## 2024-02-25 MED ORDER — SODIUM CHLORIDE 0.9% FLUSH: 3.0000 mL | Freq: Two times a day (BID) | INTRAVENOUS | Status: DC

## 2024-02-25 MED ORDER — IODIXANOL 320 MG/ML IV SOLN
INTRAVENOUS | Status: DC | PRN
  Administered 2024-02-25 (×2): 85 mL via INTRA_ARTERIAL

## 2024-02-25 MED ORDER — CLOPIDOGREL BISULFATE 75 MG PO TABS
75.0000 mg | ORAL_TABLET | Freq: Every day | ORAL | 11 refills | Status: AC
Start: 2024-02-25 — End: ?

## 2024-02-25 MED ORDER — NITROGLYCERIN 1 MG/10 ML FOR IR/CATH LAB
INTRA_ARTERIAL | Status: AC
  Filled 2024-02-25: qty 10

## 2024-02-25 MED ORDER — FAMOTIDINE 20 MG PO TABS: 40.0000 mg | ORAL_TABLET | Freq: Once | ORAL | Status: DC | PRN

## 2024-02-25 MED ORDER — LABETALOL HCL 5 MG/ML IV SOLN: 10.0000 mg | INTRAVENOUS | Status: DC | PRN

## 2024-02-25 MED ORDER — MIDAZOLAM HCL 2 MG/2ML IJ SOLN
INTRAMUSCULAR | Status: DC | PRN
  Administered 2024-02-25 (×2): 1 mg via INTRAVENOUS
  Administered 2024-02-25 (×2): 2 mg via INTRAVENOUS

## 2024-02-25 MED ORDER — CEFAZOLIN SODIUM-DEXTROSE 2-4 GM/100ML-% IV SOLN
2.0000 g | INTRAVENOUS | Status: AC
  Administered 2024-02-25 (×2): 2 g via INTRAVENOUS

## 2024-02-25 MED ORDER — ONDANSETRON HCL 4 MG/2ML IJ SOLN: 4.0000 mg | Freq: Four times a day (QID) | INTRAMUSCULAR | Status: DC | PRN

## 2024-02-25 MED ORDER — HEPARIN SODIUM (PORCINE) 1000 UNIT/ML IJ SOLN
INTRAMUSCULAR | Status: AC
  Filled 2024-02-25: qty 10

## 2024-02-25 MED ORDER — HYDROMORPHONE HCL 1 MG/ML IJ SOLN: 1.0000 mg | Freq: Once | INTRAMUSCULAR | Status: DC | PRN

## 2024-02-25 MED ORDER — ASPIRIN 81 MG PO TBEC
81.0000 mg | DELAYED_RELEASE_TABLET | Freq: Every day | ORAL | 2 refills | Status: AC
Start: 2024-02-25 — End: 2025-02-24

## 2024-02-25 MED ORDER — CEFAZOLIN SODIUM-DEXTROSE 2-4 GM/100ML-% IV SOLN
INTRAVENOUS | Status: AC
  Filled 2024-02-25: qty 100

## 2024-02-25 MED ORDER — ACETAMINOPHEN 325 MG PO TABS
650.0000 mg | ORAL_TABLET | ORAL | Status: DC | PRN
Start: 2024-02-25 — End: 2024-02-25

## 2024-02-25 MED ORDER — ASPIRIN 81 MG PO CHEW
CHEWABLE_TABLET | ORAL | Status: AC
  Filled 2024-02-25: qty 1

## 2024-02-25 MED ORDER — SODIUM CHLORIDE 0.9 % IV SOLN: INTRAVENOUS | Status: DC

## 2024-02-25 MED ORDER — FENTANYL CITRATE (PF) 100 MCG/2ML IJ SOLN
INTRAMUSCULAR | Status: DC | PRN
  Administered 2024-02-25: 50 ug via INTRAVENOUS
  Administered 2024-02-25 (×2): 25 ug via INTRAVENOUS
  Administered 2024-02-25: 50 ug via INTRAVENOUS

## 2024-02-25 MED ORDER — ASPIRIN 81 MG PO TBEC
81.0000 mg | DELAYED_RELEASE_TABLET | Freq: Every day | ORAL | Status: DC
  Administered 2024-02-25 (×2): 81 mg via ORAL

## 2024-02-25 MED ORDER — SODIUM CHLORIDE 0.9 % IV SOLN: 250.0000 mL | INTRAVENOUS | Status: DC | PRN

## 2024-02-25 MED ORDER — LIDOCAINE-EPINEPHRINE (PF) 1 %-1:200000 IJ SOLN
INTRAMUSCULAR | Status: DC | PRN
  Administered 2024-02-25 (×2): 10 mL

## 2024-02-25 MED ORDER — FENTANYL CITRATE (PF) 100 MCG/2ML IJ SOLN
INTRAMUSCULAR | Status: AC
  Filled 2024-02-25: qty 2

## 2024-02-25 MED ORDER — ATORVASTATIN CALCIUM 20 MG PO TABS: 20.0000 mg | ORAL_TABLET | Freq: Every day | ORAL | 5 refills | Status: AC

## 2024-02-25 MED ORDER — SODIUM CHLORIDE 0.9% FLUSH: 3.0000 mL | INTRAVENOUS | Status: DC | PRN

## 2024-02-25 MED ORDER — CLOPIDOGREL BISULFATE 75 MG PO TABS: 75.0000 mg | ORAL_TABLET | Freq: Every day | ORAL | Status: DC

## 2024-02-25 MED ORDER — ASPIRIN 81 MG PO TBEC
DELAYED_RELEASE_TABLET | ORAL | Status: AC
  Filled 2024-02-25: qty 1

## 2024-02-25 MED ORDER — MIDAZOLAM HCL 5 MG/5ML IJ SOLN
INTRAMUSCULAR | Status: AC
  Filled 2024-02-25: qty 5

## 2024-02-25 MED ORDER — HEPARIN (PORCINE) IN NACL 2000-0.9 UNIT/L-% IV SOLN
INTRAVENOUS | Status: DC | PRN
  Administered 2024-02-25 (×2): 1000 mL

## 2024-02-25 MED ORDER — HEPARIN SODIUM (PORCINE) 1000 UNIT/ML IJ SOLN
INTRAMUSCULAR | Status: DC | PRN
  Administered 2024-02-25 (×2): 4000 [IU] via INTRAVENOUS

## 2024-02-25 MED ORDER — HYDRALAZINE HCL 20 MG/ML IJ SOLN: 5.0000 mg | INTRAMUSCULAR | Status: DC | PRN

## 2024-02-25 MED ORDER — MIDAZOLAM HCL 2 MG/ML PO SYRP: 8.0000 mg | ORAL_SOLUTION | Freq: Once | ORAL | Status: DC | PRN

## 2024-02-25 MED ORDER — DIPHENHYDRAMINE HCL 50 MG/ML IJ SOLN: 50.0000 mg | Freq: Once | INTRAMUSCULAR | Status: DC | PRN

## 2024-02-25 SURGICAL SUPPLY — 22 items
BALLOON LUTONIX 018 4X150X130 (BALLOONS) IMPLANT
BALLOON LUTONIX 018 4X300X130 (BALLOONS) IMPLANT
BALLOON LUTONIX DCB 5X80X130 (BALLOONS) IMPLANT
BALLOON LUTONIX DCB 6X80X130 (BALLOONS) IMPLANT
CATH ANGIO 5F PIGTAIL 65CM (CATHETERS) IMPLANT
CATH BEACON 5 .038 100 VERT TP (CATHETERS) IMPLANT
CATH SEEKER .035X135CM (CATHETERS) IMPLANT
COVER PROBE ULTRASOUND 5X96 (MISCELLANEOUS) IMPLANT
DEVICE VASC CLSR CELT ART 6 (Vascular Products) IMPLANT
GLIDEWIRE ADV .035X260CM (WIRE) IMPLANT
KIT ENCORE 26 ADVANTAGE (KITS) IMPLANT
KIT MICROPUNCTURE VSI 5F STIFF (SHEATH) IMPLANT
PACK ANGIOGRAPHY (CUSTOM PROCEDURE TRAY) ×1 IMPLANT
SHEATH BRITE TIP 6FRX11 (SHEATH) IMPLANT
SHEATH PINNACLE ST 6F 45CM (SHEATH) IMPLANT
STENT LIFESTAR 7X80X80 (Permanent Stent) IMPLANT
STENT VIABAHN 5X100X120 (Permanent Stent) IMPLANT
STENT VIABAHN 5X250X120 (Permanent Stent) IMPLANT
SYR MEDRAD MARK 7 150ML (SYRINGE) IMPLANT
TUBING CONTRAST HIGH PRESS 72 (TUBING) IMPLANT
WIRE G V18X300CM (WIRE) IMPLANT
WIRE J 3MM .035X145CM (WIRE) IMPLANT

## 2024-02-25 NOTE — Op Note (Signed)
 [facility]  Percutaneous Study/Intervention Procedural Note   Date of Surgery: 02/25/2024  Surgeon(s):Lilianne Delair    Assistants:none  Pre-operative Diagnosis: PAD with claudication bilateral lower extremities  Post-operative diagnosis:  Same  Procedure(s) Performed:             1.  Ultrasound guidance for vascular access right femoral artery             2.  Catheter placement into left common femoral artery from right femoral approach             3.  Aortogram and selective left lower extremity angiogram             4.  Percutaneous transluminal angioplasty of right external iliac artery with 5 mm diameter by 8 cm length Lutonix drug-coated angioplasty balloon             5.  Percutaneous transluminal angioplasty of left superficial femoral artery with 4 mm diameter by 30 cm length angioplasty balloon  6.  Stent placement to the left SFA with a 5 mm diameter by 25 cm length 5 mm diameter by 10 cm length Viabahn stent  7.  Stent placement to the right external iliac artery with 7 mm diameter by 8 cm length life star stent             8.  Celt closure device left femoral artery  EBL: 5 cc  Contrast: 85 cc  Fluoro Time: 11.9 minutes  Moderate Conscious Sedation Time: approximately 56 minutes using 3 mg of Versed  and 75 mcg of Fentanyl               Indications:  Patient is a 78 y.o.female with disabling claudication symptoms of both lower extremities. The patient has noninvasive study showing markedly reduced ABIs bilaterally. The patient is brought in for angiography for further evaluation and potential treatment.   Risks and benefits are discussed and informed consent is obtained.   Procedure:  The patient was identified and appropriate procedural time out was performed.  The patient was then placed supine on the table and prepped and draped in the usual sterile fashion. Moderate conscious sedation was administered during a face to face encounter with the  patient throughout the procedure with my supervision of the RN administering medicines and monitoring the patient's vital signs, pulse oximetry, telemetry and mental status throughout from the start of the procedure until the patient was taken to the recovery room. Ultrasound was used to evaluate the right common femoral artery.  It was patent but calcified and somewhat diseased.  A digital ultrasound image was acquired.  A Seldinger needle was used to access the right common femoral artery under direct ultrasound guidance and a permanent image was performed.  A 0.035 J wire was advanced without resistance and a 5Fr sheath was placed.  Pigtail catheter was placed into the aorta and an AP aortogram was performed. This demonstrated normal renal arteries and normal aorta but the iliac arteries were small and there was significant disease particularly on the right side.  I then pulled the pigtail catheter down and did pelvic obliques.  This demonstrated a 70 to 80% stenosis of the right external iliac artery.  The right common iliac artery had a mild stenosis of about 30%.  Left common and external iliac arteries were small but did not have focal stenosis that was hemodynamically significant. I then crossed the aortic bifurcation and advanced to the left femoral head. Selective left lower  extremity angiogram was then performed. This demonstrated a flush occlusion of the left SFA with a large profunda femoris artery.  There was reconstitution of the above-knee popliteal artery.  The posterior tibial and peroneal arteries reconstituted but were very small and flow was quite sluggish.  There did not appear to be focal stenosis on initial imaging.  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 It was felt that it was in the patient's best interest to proceed with intervention after these images to avoid a second procedure and a larger amount of contrast and fluoroscopy based off of the findings from the initial  angiogram. The patient was systemically heparinized and first performed angioplasty of the right external iliac artery before placing a larger sheath then.  A 5 mm diameter by 8 cm length Lutonix drug-coated angioplasty balloon was inflated to 6 atm for 1 minute.  There remained greater than 50% residual stenosis and we would stent this on the way out.  A 6 French Ansell sheath was then placed over the [employer] wire. I then used a Kumpe catheter and the advantage wire to get into the occluded SFA and cross this without difficulty with a Kumpe catheter initially and then exchanging for a seeker catheter.  I then exchanged for a V18 wire.  A 4 mm diameter by 30 cm length Lutonix drug-coated angioplasty balloon was inflated throughout the left SFA and proximal popliteal artery and taken up to 10 atm for 1 minute.  Completion imaging showed diffuse residual disease with greater than 50% residual stenosis throughout the SFA.  I then placed a 5 mm diameter by 25 cm length Viabahn stent from the above-knee popliteal artery up to the proximal SFA.  A 5 mm diameter by 10 cm length Viabahn stent was taken down from about a centimeter to below the femoral bifurcation to bridge just into the initial stent.  These were postdilated with 4 mm diameter Lutonix drug-coated balloon with excellent angiographic completion result and less than 10% residual stenosis after stent placement in the left SFA.  I then exchanged for a short 6 French sheath and the advantage wire.  A 7 mm diameter by 8 cm length life star stent was placed in the right external iliac artery just up into the distal common iliac artery.  This was postdilated with a 6 mm diameter Lutonix drug-coated balloon with excellent angiographic completion result and less than 10% residual stenosis. I elected to terminate the procedure. The sheath was removed and Celt closure device was deployed in the right femoral artery with excellent hemostatic result. The patient  was taken to the recovery room in stable condition having tolerated the procedure well.  Findings:               Aortogram:  This demonstrated normal renal arteries and normal aorta but the iliac arteries were small and there was significant disease particularly on the right side.  I then pulled the pigtail catheter down and did pelvic obliques.  This demonstrated a 70 to 80% stenosis of the right external iliac artery.  The right common iliac artery had a mild stenosis of about 30%.  Left common and external iliac arteries were small but did not have focal stenosis that was hemodynamically significant.             Left lower Extremity:  This demonstrated a flush occlusion of the left SFA with a large profunda femoris artery.  There was reconstitution of the above-knee popliteal artery.  The posterior tibial and peroneal arteries reconstituted but were very small and flow was quite sluggish.  There did not appear to be focal stenosis on initial imaging.  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   Disposition: Patient was taken to the recovery room in stable condition having tolerated the procedure well.  Complications: None  Selinda Gu 02/25/2024 3:27 PM   This note was created with Dragon Medical transcription system. Any errors in dictation are purely unintentional.

## 2024-02-25 NOTE — Op Note (Signed)
 [facility]  Percutaneous Study/Intervention Procedural Note   Date of Surgery: 02/25/2024  Surgeon(s):Ollie Esty    Assistants:none  Pre-operative Diagnosis: PAD with claudication bilateral lower extremities  Post-operative diagnosis:  Same  Procedure(s) Performed:             1.  Ultrasound guidance for vascular access right femoral artery             2.  Catheter placement into left common femoral artery from right femoral approach             3.  Aortogram and selective left lower extremity angiogram             4.  Percutaneous transluminal angioplasty of right external iliac artery with 5 mm diameter by 8 cm length Lutonix drug-coated angioplasty balloon             5.  Percutaneous transluminal angioplasty of left superficial femoral artery with 4 mm diameter by 30 cm length angioplasty balloon  6.  Stent placement to the left SFA with a 5 mm diameter by 25 cm length 5 mm diameter by 10 cm length Viabahn stent  7.  Stent placement to the right external iliac artery with 7 mm diameter by 8 cm length life star stent             8.  Celt closure device left femoral artery  EBL: 5 cc  Contrast: 85 cc  Fluoro Time: 11.9 minutes  Moderate Conscious Sedation Time: approximately 56 minutes using 3 mg of Versed  and 75 mcg of Fentanyl               Indications:  Patient is a 78 y.o.female with disabling claudication symptoms of both lower extremities. The patient has noninvasive study showing markedly reduced ABIs bilaterally. The patient is brought in for angiography for further evaluation and potential treatment.   Risks and benefits are discussed and informed consent is obtained.   Procedure:  The patient was identified and appropriate procedural time out was performed.  The patient was then placed supine on the table and prepped and draped in the usual sterile fashion. Moderate conscious sedation was administered during a face to face encounter with the  patient throughout the procedure with my supervision of the RN administering medicines and monitoring the patient's vital signs, pulse oximetry, telemetry and mental status throughout from the start of the procedure until the patient was taken to the recovery room. Ultrasound was used to evaluate the right common femoral artery.  It was patent but calcified and somewhat diseased.  A digital ultrasound image was acquired.  A Seldinger needle was used to access the right common femoral artery under direct ultrasound guidance and a permanent image was performed.  A 0.035 J wire was advanced without resistance and a 5Fr sheath was placed.  Pigtail catheter was placed into the aorta and an AP aortogram was performed. This demonstrated normal renal arteries and normal aorta but the iliac arteries were small and there was significant disease particularly on the right side.  I then pulled the pigtail catheter down and did pelvic obliques.  This demonstrated a 70 to 80% stenosis of the right external iliac artery.  The right common iliac artery had a mild stenosis of about 30%.  Left common and external iliac arteries were small but did not have focal stenosis that was hemodynamically significant. I then crossed the aortic bifurcation and advanced to the left femoral head. Selective left lower  extremity angiogram was then performed. This demonstrated a flush occlusion of the left SFA with a large profunda femoris artery.  There was reconstitution of the above-knee popliteal artery.  The posterior tibial and peroneal arteries reconstituted but were very small and flow was quite sluggish.  There did not appear to be focal stenosis on initial imaging.  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 It was felt that it was in the patient's best interest to proceed with intervention after these images to avoid a second procedure and a larger amount of contrast and fluoroscopy based off of the findings from the initial  angiogram. The patient was systemically heparinized and first performed angioplasty of the right external iliac artery before placing a larger sheath then.  A 5 mm diameter by 8 cm length Lutonix drug-coated angioplasty balloon was inflated to 6 atm for 1 minute.  There remained greater than 50% residual stenosis and we would stent this on the way out.  A 6 French Ansell sheath was then placed over the [employer] wire. I then used a Kumpe catheter and the advantage wire to get into the occluded SFA and cross this without difficulty with a Kumpe catheter initially and then exchanging for a seeker catheter.  I then exchanged for a V18 wire.  A 4 mm diameter by 30 cm length Lutonix drug-coated angioplasty balloon was inflated throughout the left SFA and proximal popliteal artery and taken up to 10 atm for 1 minute.  Completion imaging showed diffuse residual disease with greater than 50% residual stenosis throughout the SFA.  I then placed a 5 mm diameter by 25 cm length Viabahn stent from the above-knee popliteal artery up to the proximal SFA.  A 5 mm diameter by 10 cm length Viabahn stent was taken down from about a centimeter to below the femoral bifurcation to bridge just into the initial stent.  These were postdilated with 4 mm diameter Lutonix drug-coated balloon with excellent angiographic completion result and less than 10% residual stenosis after stent placement in the left SFA.  I then exchanged for a short 6 French sheath and the advantage wire.  A 7 mm diameter by 8 cm length life star stent was placed in the right external iliac artery just up into the distal common iliac artery.  This was postdilated with a 6 mm diameter Lutonix drug-coated balloon with excellent angiographic completion result and less than 10% residual stenosis. I elected to terminate the procedure. The sheath was removed and Celt closure device was deployed in the right femoral artery with excellent hemostatic result. The patient  was taken to the recovery room in stable condition having tolerated the procedure well.  Findings:               Aortogram:  This demonstrated normal renal arteries and normal aorta but the iliac arteries were small and there was significant disease particularly on the right side.  I then pulled the pigtail catheter down and did pelvic obliques.  This demonstrated a 70 to 80% stenosis of the right external iliac artery.  The right common iliac artery had a mild stenosis of about 30%.  Left common and external iliac arteries were small but did not have focal stenosis that was hemodynamically significant.             Left lower Extremity:  This demonstrated a flush occlusion of the left SFA with a large profunda femoris artery.  There was reconstitution of the above-knee popliteal artery.  The posterior tibial and peroneal arteries reconstituted but were very small and flow was quite sluggish.  There did not appear to be focal stenosis on initial imaging.  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   Disposition: Patient was taken to the recovery room in stable condition having tolerated the procedure well.  Complications: None  Selinda Gu 02/25/2024 3:27 PM   This note was created with Dragon Medical transcription system. Any errors in dictation are purely unintentional.

## 2024-02-25 NOTE — Telephone Encounter (Signed)
 Patient left a message wanting to change the time of her arrival time to the [facility]. I explained that I can reschedule her to a different date but that I usually don't rescheduled to a different  time on the schedule as most patient are already on the schedule at this time.

## 2024-02-26 ENCOUNTER — Encounter: Payer: Self-pay | Admitting: Vascular Surgery

## 2024-03-03 ENCOUNTER — Encounter: Payer: Self-pay | Admitting: Vascular Surgery

## 2024-03-03 ENCOUNTER — Other Ambulatory Visit: Payer: Self-pay

## 2024-03-03 ENCOUNTER — Ambulatory Visit
Admission: RE | Admit: 2024-03-03 | Discharge: 2024-03-03 | Disposition: A | Discharge status: Home / Self Care | Attending: Vascular Surgery | Admitting: Vascular Surgery

## 2024-03-03 ENCOUNTER — Encounter
Admission: RE | Disposition: A | Discharge status: Home / Self Care | Payer: Self-pay | Source: Home / Self Care | Attending: Vascular Surgery

## 2024-03-03 DIAGNOSIS — Z87891 Personal history of nicotine dependence: Secondary | ICD-10-CM | POA: Insufficient documentation

## 2024-03-03 DIAGNOSIS — I10 Essential (primary) hypertension: Secondary | ICD-10-CM | POA: Insufficient documentation

## 2024-03-03 DIAGNOSIS — I70213 Atherosclerosis of native arteries of extremities with intermittent claudication, bilateral legs: Secondary | ICD-10-CM

## 2024-03-03 DIAGNOSIS — E785 Hyperlipidemia, unspecified: Secondary | ICD-10-CM | POA: Diagnosis not present

## 2024-03-03 DIAGNOSIS — Z79899 Other long term (current) drug therapy: Secondary | ICD-10-CM | POA: Insufficient documentation

## 2024-03-03 DIAGNOSIS — I70219 Atherosclerosis of native arteries of extremities with intermittent claudication, unspecified extremity: Secondary | ICD-10-CM

## 2024-03-03 DIAGNOSIS — Z9889 Other specified postprocedural states: Secondary | ICD-10-CM

## 2024-03-03 DIAGNOSIS — I743 Embolism and thrombosis of arteries of the lower extremities: Secondary | ICD-10-CM

## 2024-03-03 HISTORY — PX: LOWER EXTREMITY ANGIOGRAPHY: CATH118251

## 2024-03-03 LAB — BUN: BUN: 37 mg/dL — ABNORMAL HIGH (ref 8–23)

## 2024-03-03 LAB — CREATININE, SERUM
Creatinine, Ser: 1.36 mg/dL — ABNORMAL HIGH (ref 0.44–1.00)
GFR, Estimated: 39 mL/min — ABNORMAL LOW (ref >60)

## 2024-03-03 SURGERY — LOWER EXTREMITY ANGIOGRAPHY
Anesthesia: Moderate Sedation | Site: Leg Lower | Laterality: Right

## 2024-03-03 MED ORDER — METHYLPREDNISOLONE SODIUM SUCC 125 MG IJ SOLR: 125.0000 mg | Freq: Once | INTRAMUSCULAR | Status: DC | PRN

## 2024-03-03 MED ORDER — DIPHENHYDRAMINE HCL 50 MG/ML IJ SOLN: 50.0000 mg | Freq: Once | INTRAMUSCULAR | Status: DC | PRN

## 2024-03-03 MED ORDER — HEPARIN SODIUM (PORCINE) 1000 UNIT/ML IJ SOLN
INTRAMUSCULAR | Status: AC
  Filled 2024-03-03: qty 10

## 2024-03-03 MED ORDER — FAMOTIDINE 20 MG PO TABS: 40.0000 mg | ORAL_TABLET | Freq: Once | ORAL | Status: DC | PRN

## 2024-03-03 MED ORDER — CEFAZOLIN SODIUM-DEXTROSE 2-4 GM/100ML-% IV SOLN
2.0000 g | INTRAVENOUS | Status: AC
  Administered 2024-03-03: 2 g via INTRAVENOUS

## 2024-03-03 MED ORDER — MIDAZOLAM HCL 2 MG/2ML IJ SOLN
INTRAMUSCULAR | Status: AC
Start: 2024-03-03 — End: 2024-03-03
  Filled 2024-03-03: qty 4

## 2024-03-03 MED ORDER — SODIUM CHLORIDE 0.9 % IV BOLUS
INTRAVENOUS | Status: DC | PRN
  Administered 2024-03-03: 250 mL via INTRAVENOUS

## 2024-03-03 MED ORDER — HEPARIN SODIUM (PORCINE) 1000 UNIT/ML IJ SOLN
INTRAMUSCULAR | Status: DC | PRN
  Administered 2024-03-03: 4000 [IU] via INTRAVENOUS

## 2024-03-03 MED ORDER — ASPIRIN 81 MG PO TBEC: 81.0000 mg | DELAYED_RELEASE_TABLET | Freq: Every day | ORAL | 2 refills | Status: DC

## 2024-03-03 MED ORDER — MIDAZOLAM HCL 2 MG/2ML IJ SOLN
INTRAMUSCULAR | Status: DC | PRN
  Administered 2024-03-03: 2 mg via INTRAVENOUS
  Administered 2024-03-03: 1 mg via INTRAVENOUS

## 2024-03-03 MED ORDER — MIDAZOLAM HCL 2 MG/ML PO SYRP: 8.0000 mg | ORAL_SOLUTION | Freq: Once | ORAL | Status: DC | PRN

## 2024-03-03 MED ORDER — SODIUM CHLORIDE 0.9 % IV SOLN: INTRAVENOUS | Status: DC

## 2024-03-03 MED ORDER — LIDOCAINE-EPINEPHRINE (PF) 1 %-1:200000 IJ SOLN
INTRAMUSCULAR | Status: DC | PRN
Start: 2024-03-03 — End: 2024-03-03
  Administered 2024-03-03: 10 mL

## 2024-03-03 MED ORDER — FENTANYL CITRATE PF 50 MCG/ML IJ SOSY
PREFILLED_SYRINGE | INTRAMUSCULAR | Status: AC
  Filled 2024-03-03: qty 2

## 2024-03-03 MED ORDER — FENTANYL CITRATE (PF) 100 MCG/2ML IJ SOLN
INTRAMUSCULAR | Status: DC | PRN
  Administered 2024-03-03: 25 ug via INTRAVENOUS
  Administered 2024-03-03: 50 ug via INTRAVENOUS

## 2024-03-03 MED ORDER — IODIXANOL 320 MG/ML IV SOLN
INTRAVENOUS | Status: DC | PRN
  Administered 2024-03-03: 55 mL via INTRA_ARTERIAL

## 2024-03-03 MED ORDER — ONDANSETRON HCL 4 MG/2ML IJ SOLN: 4.0000 mg | Freq: Four times a day (QID) | INTRAMUSCULAR | Status: DC | PRN

## 2024-03-03 MED ORDER — CEFAZOLIN SODIUM-DEXTROSE 2-4 GM/100ML-% IV SOLN
INTRAVENOUS | Status: AC
Start: 2024-03-03 — End: 2024-03-03
  Filled 2024-03-03: qty 100

## 2024-03-03 MED ORDER — NITROGLYCERIN 1 MG/10 ML FOR IR/CATH LAB
INTRA_ARTERIAL | Status: DC | PRN
  Administered 2024-03-03: 300 ug via INTRA_ARTERIAL

## 2024-03-03 MED ORDER — HYDROMORPHONE HCL 1 MG/ML IJ SOLN: 1.0000 mg | Freq: Once | INTRAMUSCULAR | Status: DC | PRN

## 2024-03-03 MED ORDER — HEPARIN (PORCINE) IN NACL 2000-0.9 UNIT/L-% IV SOLN
INTRAVENOUS | Status: DC | PRN
  Administered 2024-03-03: 1000 mL

## 2024-03-03 SURGICAL SUPPLY — 22 items
BALLOON LUTONIX 018 4X300X130 (BALLOONS) IMPLANT
BALLOON ULTRVS 018 2.5X100X150 (BALLOONS) IMPLANT
CATH ANGIO 5F PIGTAIL 65CM (CATHETERS) IMPLANT
CATH BEACON 5 .038 100 VERT TP (CATHETERS) IMPLANT
CATH LIGHTNING BOLT 6 XTX (CATHETERS) IMPLANT
CATH ROTAREX 135 6FR (CATHETERS) IMPLANT
CATH TEMPO 5F RIM 65CM (CATHETERS) IMPLANT
COVER PROBE ULTRASOUND 5X96 (MISCELLANEOUS) IMPLANT
DEVICE PRESTO INFLATION (MISCELLANEOUS) IMPLANT
DEVICE STARCLOSE SE CLOSURE (Vascular Products) IMPLANT
GLIDEWIRE ADV .035X260CM (WIRE) IMPLANT
KIT MICROPUNCTURE VSI 5F STIFF (SHEATH) IMPLANT
PACK ANGIOGRAPHY (CUSTOM PROCEDURE TRAY) ×1 IMPLANT
SHEATH ANL2 6FRX45 HC (SHEATH) IMPLANT
SHEATH BRITE TIP 5FRX11 (SHEATH) IMPLANT
STENT VIABAHN 5X250X120 (Permanent Stent) IMPLANT
STENT VIABAHN 5X7.5X120 (Permanent Stent) IMPLANT
SYR MEDRAD MARK 7 150ML (SYRINGE) IMPLANT
TUBING CONTRAST HIGH PRESS 72 (TUBING) IMPLANT
WIRE G V18X300CM (WIRE) IMPLANT
WIRE J 3MM .035X145CM (WIRE) IMPLANT
WIRE RUNTHROUGH .014X300CM (WIRE) IMPLANT

## 2024-03-03 NOTE — Interval H&P Note (Signed)
 History and Physical Interval Note:  03/03/2024 8:13 AM  Megan Irwin  has presented today for surgery, with the diagnosis of RLE Angio   ASO w claudication.  The various methods of treatment have been discussed with the patient and family. After consideration of risks, benefits and other options for treatment, the patient has consented to  Procedure(s): Lower Extremity Angiography (Right) as a surgical intervention.  The patient's history has been reviewed, patient examined, no change in status, stable for surgery.  I have reviewed the patient's chart and labs.  Questions were answered to the patient's satisfaction.     Deolinda Frid

## 2024-03-03 NOTE — Interval H&P Note (Signed)
 History and Physical Interval Note:  03/03/2024 8:05 AM  Megan Irwin  has presented today for surgery, with the diagnosis of LLE Angio   ASO w claudication.  The various methods of treatment have been discussed with the patient and family. After consideration of risks, benefits and other options for treatment, the patient has consented to  Procedure(s): Lower Extremity Angiography (Left) as a surgical intervention.  The patient's history has been reviewed, patient examined, no change in status, stable for surgery.  I have reviewed the patient's chart and labs.  Questions were answered to the patient's satisfaction.     Deunta Beneke

## 2024-03-03 NOTE — Op Note (Signed)
 [facility]  Percutaneous Study/Intervention Procedural Note   Date of Surgery: 03/03/2024  Surgeon(s):Myranda Pavone    Assistants:none  Pre-operative Diagnosis: PAD with claudication BLE  Post-operative diagnosis:  Same  Procedure(s) Performed:             1.  Ultrasound guidance for vascular access left femoral artery             2.  Catheter placement into right common femoral artery from left femoral approach             3.  Selective right lower extremity angiogram             4.  Percutaneous transluminal angioplasty of the right SFA and popliteal arteries with a 4 mm diameter by 30 cm length Lutonix drug-coated angioplasty balloon             5.  Stent placement x 2 to the right SFA and popliteal arteries with 5 mm diameter by 25 cm length and a 5 mm diameter by 7.5 cm length Viabahn stent  6.  Mechanical thrombectomy to the right popliteal artery with the [place] device  7.  Percutaneous transluminal angioplasty of the right tibioperoneal trunk and proximal peroneal artery with 2.5 mm diameter by 10 cm length angioplasty balloon.   8.  Mechanical thrombectomy to the right tibioperoneal trunk and posterior tibial artery with the penumbra CAT 6 device             9.  StarClose closure device left femoral artery  EBL: 200 cc  Contrast: 55 cc  Fluoro Time: 10.5 minutes  Moderate Conscious Sedation Time: approximately 76 minutes using 3 mg of Versed  and 75 mcg of Fentanyl               Indications:  Patient is a 78 y.o.female with disabling claudication symptoms of bilateral lower extremities.  She has already undergone left lower extremity revascularization. The patient has noninvasive study showing reduced ABIs bilaterally. The patient is brought in for angiography for further evaluation and potential treatment.  Risks and benefits are discussed and informed consent is obtained.   Procedure:  The patient was identified and appropriate procedural time out  was performed.  The patient was then placed supine on the table and prepped and draped in the usual sterile fashion. Moderate conscious sedation was administered during a face to face encounter with the patient throughout the procedure with my supervision of the RN administering medicines and monitoring the patient's vital signs, pulse oximetry, telemetry and mental status throughout from the start of the procedure until the patient was taken to the recovery room. Ultrasound was used to evaluate the left common femoral artery.  It was patent .  A digital ultrasound image was acquired.  A Seldinger needle was used to access the left common femoral artery under direct ultrasound guidance and a permanent image was performed.  A 0.035 J wire was advanced without resistance and a 5Fr sheath was placed. I then crossed the aortic bifurcation and advanced to the right femoral head. Selective right lower extremity angiogram was then performed. This demonstrated multiple areas of high-grade stenosis throughout the SFA starting in the proximal portion and going down into the midportion.  There was a high-grade stenosis at Hunter's canal of greater than 90%.  The above-knee popliteal artery also had a greater than 90% stenosis.  The below-knee popliteal artery was fairly normal.  There was a calcific lesion in the tibioperoneal trunk but  this was underperfused and it was difficult to discern how much stenosis was actually present.  All 3 tibial vessels appear to be patent on initial imaging but the flow was quite sluggish. It was felt that it was in the patient's best interest to proceed with intervention after these images to avoid a second procedure and a larger amount of contrast and fluoroscopy based off of the findings from the initial angiogram. The patient was systemically heparinized and a 6 [place] sheath was then placed over the [employer] wire. I then used a Kumpe catheter and the advantage wire to easily  navigate through the SFA and popliteal lesions and then exchanged for a V18 wire which was parked in the peroneal artery.  I started with angioplasty of the right SFA and above-knee popliteal artery with a 4 mm diameter by 30 cm length Lutonix drug-coated angioplasty balloon inflated to 8 atm for 1 minute.  Completion imaging showed diffuse residual disease greater than 50% as well as areas of chronic thrombus that appeared highly irregular and flow-limiting and so I elected to place covered stents.  A 5 to diameter by 25 cm length Viabahn stent 5 mm diameter by 7.5 cm length Viabahn stent was deployed from the proximal SFA down to the proximal to mid popliteal artery.  These were postdilated with 4 mm balloons.  Completion imaging following this showed the SFA and popliteal stents now to be widely patent, but there were blobs of chronic thrombus in the popliteal artery and the tibioperoneal trunk after stent placement.  I initially passed the [place] Rex device 3 times in the right popliteal artery down to the most proximal tibioperoneal trunk and the more proximal blob of thrombus was removed but the more distal blob was now lodged in the tibioperoneal trunk at the origins of the peroneal and posterior tibial arteries.  I then performed angioplasty of the tibioperoneal trunk and proximal peroneal artery with a 2.5 mm diameter by 10 cm length angioplasty balloon inflated to 6 atm for 1 minute.  There remained flow-limiting thrombus and stenosis at both the origins of the peroneal artery and posterior tibial artery and the distal tibioperoneal trunk.  This vessel was too small for the [place] Rex mechanical thrombectomy device and so I exchanged for the penumbra CAT 6 mechanical thrombectomy device.  I exchanged for a 0.014 wire and directed this down the posterior tibial artery and perform mechanical thrombectomy in the right tibioperoneal trunk and posterior tibial artery with the penumbra CAT 6 mechanical thrombectomy  catheter.  Following several passes with the penumbra device completion imaging is performed.  This now showed two-vessel runoff with both the posterior tibial and anterior tibial arteries being widely patent distally.  There was still thrombus down in the peroneal artery in the proximal to mid segment but I elected to not chase this any further as we had two-vessel runoff distally. I elected to terminate the procedure. The sheath was removed and StarClose closure device was deployed in the left femoral artery with excellent hemostatic result. The patient was taken to the recovery room in stable condition having tolerated the procedure well.  Findings:                        Right Lower Extremity:  This demonstrated multiple areas of high-grade stenosis throughout the SFA starting in the proximal portion and going down into the midportion.  There was a high-grade stenosis at Hunter's canal of greater  than 90%.  The above-knee popliteal artery also had a greater than 90% stenosis.  The below-knee popliteal artery was fairly normal.  There was a calcific lesion in the tibioperoneal trunk but this was underperfused and it was difficult to discern how much stenosis was actually present.  All 3 tibial vessels appear to be patent on initial imaging but the flow was quite sluggish.   Disposition: Patient was taken to the recovery room in stable condition having tolerated the procedure well.  Complications: None  Selinda Gu 03/03/2024 9:40 AM   This note was created with Dragon Medical transcription system. Any errors in dictation are purely unintentional.

## 2024-03-12 ENCOUNTER — Telehealth (INDEPENDENT_AMBULATORY_CARE_PROVIDER_SITE_OTHER): Payer: Self-pay

## 2024-03-12 NOTE — Telephone Encounter (Signed)
 Patient left a message stating that for few days she been having shortness of breath during activity. Patient is concerned since she had right le angio on 03/03/24. I did advise patient to contact her PCP for further evaluation. Please advise on vascular standpoint.

## 2024-03-12 NOTE — Telephone Encounter (Signed)
 Shortness of breath after an angiogram is not a very common side effect.  Typically we do not introduce anything to your lungs nor are we working in that area.  The only concern would be regarding the Plavix .  Although it is not, it is a rare side effect that some people can experience shortness of breath with beginning Plavix .  Unfortunately, we cannot stop without replacing it with some other agent due to your recent intervention.  In this instance if she is agreeable, we can try sending her in Eliquis  to see if there is some improvement in her symptoms once she stops her Plavix .  I would also recommend going to her PCP for evaluation as well to ensure there is not some other underlying issues

## 2024-03-13 ENCOUNTER — Other Ambulatory Visit: Payer: Self-pay | Admitting: Pediatrics

## 2024-03-13 DIAGNOSIS — R5383 Other fatigue: Secondary | ICD-10-CM

## 2024-03-13 DIAGNOSIS — J320 Chronic maxillary sinusitis: Secondary | ICD-10-CM

## 2024-03-13 DIAGNOSIS — R634 Abnormal weight loss: Secondary | ICD-10-CM

## 2024-03-13 DIAGNOSIS — R42 Dizziness and giddiness: Secondary | ICD-10-CM

## 2024-03-13 NOTE — Telephone Encounter (Signed)
 Left a message on patient voicemail to return call to office pertaining to note below

## 2024-03-14 ENCOUNTER — Other Ambulatory Visit (INDEPENDENT_AMBULATORY_CARE_PROVIDER_SITE_OTHER): Payer: Self-pay | Admitting: Nurse Practitioner

## 2024-03-14 MED ORDER — APIXABAN 5 MG PO TABS: 5.0000 mg | ORAL_TABLET | Freq: Two times a day (BID) | ORAL | 3 refills | Status: DC

## 2024-03-14 NOTE — Telephone Encounter (Signed)
Eliquis sent in 

## 2024-03-14 NOTE — Telephone Encounter (Signed)
 Patient has been notified with medical recommendations and verbalized that she seen her PCP yesterday. Patient PCP has order for the patient get a CT scan. Patient is agreeable with Eliquis  prescription.

## 2024-03-19 ENCOUNTER — Ambulatory Visit
Admission: RE | Admit: 2024-03-19 | Discharge: 2024-03-19 | Disposition: A | Discharge status: Home / Self Care | Source: Ambulatory Visit | Attending: Pediatrics | Admitting: Pediatrics

## 2024-03-19 DIAGNOSIS — J320 Chronic maxillary sinusitis: Secondary | ICD-10-CM | POA: Insufficient documentation

## 2024-03-19 DIAGNOSIS — R42 Dizziness and giddiness: Secondary | ICD-10-CM | POA: Diagnosis present

## 2024-03-19 MED ORDER — IOHEXOL 350 MG/ML SOLN
75.0000 mL | Freq: Once | INTRAVENOUS | Status: AC | PRN
  Administered 2024-03-19: 75 mL via INTRAVENOUS

## 2024-03-25 ENCOUNTER — Other Ambulatory Visit (INDEPENDENT_AMBULATORY_CARE_PROVIDER_SITE_OTHER): Payer: Self-pay | Admitting: Vascular Surgery

## 2024-03-25 DIAGNOSIS — Z9889 Other specified postprocedural states: Secondary | ICD-10-CM

## 2024-03-28 ENCOUNTER — Ambulatory Visit (INDEPENDENT_AMBULATORY_CARE_PROVIDER_SITE_OTHER): Admitting: Nurse Practitioner

## 2024-03-28 ENCOUNTER — Encounter (INDEPENDENT_AMBULATORY_CARE_PROVIDER_SITE_OTHER): Payer: Self-pay | Admitting: Nurse Practitioner

## 2024-03-28 ENCOUNTER — Ambulatory Visit (INDEPENDENT_AMBULATORY_CARE_PROVIDER_SITE_OTHER)

## 2024-03-28 VITALS — BP 124/76 | HR 56 | Ht 63.0 in | Wt 118.4 lb

## 2024-03-28 DIAGNOSIS — Z9889 Other specified postprocedural states: Secondary | ICD-10-CM

## 2024-03-28 DIAGNOSIS — E785 Hyperlipidemia, unspecified: Secondary | ICD-10-CM

## 2024-03-28 DIAGNOSIS — I739 Peripheral vascular disease, unspecified: Secondary | ICD-10-CM

## 2024-03-28 DIAGNOSIS — I10 Essential (primary) hypertension: Secondary | ICD-10-CM | POA: Diagnosis not present

## 2024-03-28 DIAGNOSIS — I70212 Atherosclerosis of native arteries of extremities with intermittent claudication, left leg: Secondary | ICD-10-CM | POA: Diagnosis not present

## 2024-03-29 ENCOUNTER — Encounter (INDEPENDENT_AMBULATORY_CARE_PROVIDER_SITE_OTHER): Payer: Self-pay | Admitting: Nurse Practitioner

## 2024-03-29 NOTE — Progress Notes (Signed)
 Subjective:    Patient ID: Megan Irwin, female    DOB: [birth year], 78 y.o.   MRN: [id number] Chief Complaint  Patient presents with   New Patient (Initial Visit)     Follow up with Latoiya Maradiaga E, NP (Vascular Surgery) in 4 weeks (03/24/2024); with ABIs     The patient returns to the office for followup and review status post angiogram with intervention on 08/11/202508/18/2025.   Procedure: Procedure(s) Performed:             1.  Ultrasound guidance for vascular access right femoral artery             2.  Catheter placement into left common femoral artery from right femoral approach             3.  Aortogram and selective left lower extremity angiogram             4.  Percutaneous transluminal angioplasty of right external iliac artery with 5 mm diameter by 8 cm length Lutonix drug-coated angioplasty balloon             5.  Percutaneous transluminal angioplasty of left superficial femoral artery with 4 mm diameter by 30 cm length angioplasty balloon             6.  Stent placement to the left SFA with a 5 mm diameter by 25 cm length 5 mm diameter by 10 cm length Viabahn stent             7.  Stent placement to the right external iliac artery with 7 mm diameter by 8 cm length life star stent             8.  Celt closure device left femoral artery     Procedure(s) Performed:             1.  Ultrasound guidance for vascular access left femoral artery             2.  Catheter placement into right common femoral artery from left femoral approach             3.  Selective right lower extremity angiogram             4.  Percutaneous transluminal angioplasty of the right SFA and popliteal arteries with a 4 mm diameter by 30 cm length Lutonix drug-coated angioplasty balloon             5.  Stent placement x 2 to the right SFA and popliteal arteries with 5 mm diameter by 25 cm length and a 5 mm diameter by 7.5 cm length Viabahn stent             6.  Mechanical thrombectomy to the right  popliteal artery with the Rota Rex device             7.  Percutaneous transluminal angioplasty of the right tibioperoneal trunk and proximal peroneal artery with 2.5 mm diameter by 10 cm length angioplasty balloon.   8.  Mechanical thrombectomy to the right tibioperoneal trunk and posterior tibial artery with the penumbra CAT 6 device             9.  StarClose closure device left femoral artery   The patient notes improvement in the lower extremity symptoms. No interval shortening of the patient's claudication distance or rest pain symptoms. No new ulcers or wounds have occurred since the last visit.  There have been no  significant changes to the patient's overall health care.  No documented history of amaurosis fugax or recent TIA symptoms. There are no recent neurological changes noted. No documented history of DVT, PE or superficial thrombophlebitis. The patient denies recent episodes of angina or shortness of breath.   ABI's Rt=1.09 and Lt=1.19  (previous ABI's Rt=0.55 and Lt=0.63) Duplex US  of the bilateral lower extremity arterial system shows triphasic tibial waveforms with good toe waveforms    Review of Systems  All other systems reviewed and are negative.      Objective:   Physical Exam Vitals reviewed.  HENT:     Head: Normocephalic.  Cardiovascular:     Rate and Rhythm: Normal rate.     Pulses:          Dorsalis pedis pulses are detected w/ Doppler on the right side and detected w/ Doppler on the left side.       Posterior tibial pulses are detected w/ Doppler on the right side and detected w/ Doppler on the left side.  Pulmonary:     Effort: Pulmonary effort is normal.  Skin:    General: Skin is warm and dry.  Neurological:     Mental Status: She is alert and oriented to person, place, and time.  Psychiatric:        Mood and Affect: Mood normal.        Behavior: Behavior normal.        Thought Content: Thought content normal.        Judgment: Judgment normal.      BP 124/76   Pulse (!) 56   Ht 5' 3 (1.6 m)   Wt 118 lb 6.4 oz (53.7 kg)   BMI 20.97 kg/m   Past Medical History:  Diagnosis Date   Anxiety    Cholelithiasis 09/2017   Dysrhythmia 2019   skipping a beat with palpatations   GERD (gastroesophageal reflux disease)    food gets stuck like an esophagitis   Hypertension    Insomnia    Migraines    magnesium helps   NAFL (nonalcoholic fatty liver)    Restless leg syndrome    uses heating pad to resolve   Sciatica 2019   pt is in a good place after getting an SI joint injection    Social History   Socioeconomic History   Marital status: Married    Spouse name: Not on file   Number of children: Not on file   Years of education: Not on file   Highest education level: Not on file  Occupational History   Not on file  Tobacco Use   Smoking status: Former    Current packs/day: 0.00    Types: E-cigarettes, Cigarettes    Quit date: 08/2019    Years since quitting: 4.6   Smokeless tobacco: Never  Vaping Use   Vaping status: Every Day   Devices: e-cigarette  Substance and Sexual Activity   Alcohol use: No    Comment: can not tolerate alchohol   Drug use: No   Sexual activity: Not on file  Other Topics Concern   Not on file  Social History Narrative   Not on file   Social Drivers of Health   Financial Resource Strain: Low Risk  (12/25/2023)   Received from [facility]   Overall Financial Resource Strain (CARDIA)    Difficulty of Paying Living Expenses: Not very hard  Food Insecurity: No Food Insecurity (12/25/2023)   [facility]   Hunger Vital Sign    Within the past 12 months, you worried that your food would run out before you got the money to buy more.: Never true    Within the past 12 months, the food you bought just didn't last and you didn't have money to get more.: Never true  Transportation Needs: No Transportation Needs (12/25/2023)   Received from [facility] Transportation    In the past 12 months, has lack of transportation kept you from medical appointments or from getting medications?: No    Lack of Transportation (Non-Medical): No  Physical Activity: Not on file  Stress: Not on file  Social Connections: Not on file  Intimate Partner Violence: Not on file    Past Surgical History:  Procedure Laterality Date   CHOLECYSTECTOMY N/A 10/03/2017   Procedure: LAPAROSCOPIC CHOLECYSTECTOMY;  Surgeon: Rodolph Romano, MD;  Location: [facility] ORS;  Service: General;  Laterality: N/A;   COLONOSCOPY     LOWER EXTREMITY ANGIOGRAPHY Left 02/25/2024   Procedure: Lower Extremity Angiography;  Surgeon: Marea Selinda RAMAN, MD;  Location: [facility] INVASIVE CV LAB;  Service: Cardiovascular;  Laterality: Left;   LOWER EXTREMITY ANGIOGRAPHY Right 03/03/2024   Procedure: Lower Extremity Angiography;  Surgeon: Marea Selinda RAMAN, MD;  Location: [facility] INVASIVE CV LAB;  Service: Cardiovascular;  Laterality: Right;   TONSILLECTOMY     TUBAL LIGATION      Family History  Problem Relation Age of Onset   Pneumonia Mother    Hypertension Mother    Heart attack Father 69    Allergies  Allergen Reactions   Calcium  Channel Blockers Shortness Of Breath, Anxiety, Palpitations and Other (See Comments)    Cognitive effects also--difficulty thinking   Hydrochlorothiazide Shortness Of Breath   Sodium Metabisulfite Anaphylaxis    Sulfite--in foods   Sulfites Swelling    Can't breathe   Telmisartan-Hctz Shortness Of Breath    Chest pain & fatigue    Cardizem [Diltiazem Hcl] Other (See Comments)    Difficulty eating, sleeping, and concentrating Heart goes crazy    Sulfa Antibiotics Hives   Acyclovir And Related    Plavix  [Clopidogrel ]    Lexapro [Escitalopram Oxalate] Other (See Comments)    Gi upset       Latest Ref Rng & Units 08/30/2017    4:44 PM 12/13/2016    1:33 PM  CBC  WBC 3.6 - 11.0 K/uL 8.9  7.5   Hemoglobin 12.0 - 16.0 g/dL  85.0  85.6   Hematocrit 35.0 - 47.0 % 43.9  42.8   Platelets 150 - 440 K/uL 233  245       CMP     Component Value Date/Time   NA 138 08/30/2017 1644   K 4.3 08/30/2017 1644   CL 103 08/30/2017 1644   CO2 27 08/30/2017 1644   GLUCOSE 109 (H) 08/30/2017 1644   BUN 37 (H) 03/03/2024 0741   CREATININE 1.36 (H) 03/03/2024 0741   CALCIUM  9.3 08/30/2017 1644   PROT 7.5 08/30/2017 1644   ALBUMIN 4.4 08/30/2017 1644   AST 22 08/30/2017 1644   ALT 22 08/30/2017 1644   ALKPHOS 117 08/30/2017 1644   BILITOT 0.7 08/30/2017 1644   GFRNONAA 39 (L) 03/03/2024 0741     VAS US  ABI WITH/WO TBI Result Date: 02/11/2024  LOWER EXTREMITY DOPPLER STUDY Patient Name:  Megan Irwin  Date of Exam:   02/06/2024 Medical Rec #: [id number]  Accession #:    [id number] Date of Birth: August 09, 1945      Patient Gender: F Patient Age:   14 years Exam Location:  [facility] Procedure:      VAS US  ABI WITH/WO TBI Referring Phys: ORVIN DARING --------------------------------------------------------------------------------  Indications: Claudication, rest pain, and peripheral artery disease. High Risk         Hypertension, hyperlipidemia, past history of smoking, prior Factors:          MI.  Performing Technologist: Donnice Charnley RVT  Examination Guidelines: A complete evaluation includes at minimum, Doppler waveform signals and systolic blood pressure reading at the level of bilateral brachial, anterior tibial, and posterior tibial arteries, when vessel segments are accessible. Bilateral testing is considered an integral part of a complete examination. Photoelectric Plethysmograph (PPG) waveforms and toe systolic pressure readings are included as required and additional duplex testing as needed. Limited examinations for reoccurring indications may be performed as noted.  ABI Findings: +---------+------------------+-----+----------+--------+ Right    Rt Pressure (mmHg)IndexWaveform  Comment   +---------+------------------+-----+----------+--------+ Brachial 172                                       +---------+------------------+-----+----------+--------+ PTA      94                0.55 monophasic         +---------+------------------+-----+----------+--------+ DP       91                0.53 monophasic         +---------+------------------+-----+----------+--------+ Great Toe47                0.27                    +---------+------------------+-----+----------+--------+ +---------+------------------+-----+----------+-------+ Left     Lt Pressure (mmHg)IndexWaveform  Comment +---------+------------------+-----+----------+-------+ Brachial 170                                      +---------+------------------+-----+----------+-------+ PTA      89                0.52 monophasic        +---------+------------------+-----+----------+-------+ DP       109               0.63 monophasic        +---------+------------------+-----+----------+-------+ Great Toe47                0.27                   +---------+------------------+-----+----------+-------+ +-------+-----------+-----------+------------+------------+ ABI/TBIToday's ABIToday's TBIPrevious ABIPrevious TBI +-------+-----------+-----------+------------+------------+ Right  0.55       0.27       0.61        00.49        +-------+-----------+-----------+------------+------------+ Left   0.63       0.27       0.56        0.31         +-------+-----------+-----------+------------+------------+  Bilateral ABIs appear essentially unchanged compared to prior study on 02/21/2023.  Summary: Right: Resting right ankle-brachial index indicates moderate right lower extremity arterial disease. The right toe-brachial index is abnormal. Left: Resting left ankle-brachial index indicates moderate left lower extremity arterial disease. The left  toe-brachial index is abnormal. *See table(s) above for  measurements and observations.  Electronically signed by Cordella Shawl MD on 02/11/2024 at 8:34:04 AM.    Final        Assessment & Plan:   1. Atherosclerosis of native artery of left lower extremity with intermittent claudication (HCC) (Primary) Recommend:  The patient is status post successful angiogram with intervention.  The patient reports that the claudication symptoms and leg pain has improved.   The patient denies lifestyle limiting changes at this point in time.  No further invasive studies, angiography or surgery at this time. The patient should continue walking and begin a more formal exercise program.  The patient should continue antiplatelet therapy and aggressive treatment of the lipid abnormalities  Continued surveillance is indicated as atherosclerosis is likely to progress with time.    Patient should undergo noninvasive studies as ordered. The patient will follow up with me to review the studies.   2. Essential hypertension Continue antihypertensive medications as already ordered, these medications have been reviewed and there are no changes at this time.  3. Hyperlipidemia, unspecified hyperlipidemia type Continue statin as ordered and reviewed, no changes at this time   Current Outpatient Medications on File Prior to Visit  Medication Sig Dispense Refill   apixaban  (ELIQUIS ) 5 MG TABS tablet Take 1 tablet (5 mg total) by mouth 2 (two) times daily. (Patient not taking: Reported on 03/28/2024) 60 tablet 3   aspirin  EC 81 MG tablet Take 1 tablet (81 mg total) by mouth daily. Swallow whole. 150 tablet 2   atenolol (TENORMIN) 100 MG tablet Take 100 mg by mouth daily. In the morning     Chlorpheniramine-DM (CORICIDIN HBP COUGH/COLD PO) Take 1 tablet by mouth 2 (two) times daily as needed (for cold symptoms.).     gabapentin (NEURONTIN) 300 MG capsule Take 300 mg by mouth daily.     tizanidine (ZANAFLEX) 2 MG capsule Take 2 mg by mouth at bedtime as needed for muscle  spasms. Take a 1/2 tablet     valsartan (DIOVAN) 160 MG tablet Take 160 mg by mouth daily.     atorvastatin  (LIPITOR) 20 MG tablet Take 1 tablet (20 mg total) by mouth daily at 6 PM. Patient only takes half a tablet most times (Patient not taking: Reported on 03/28/2024) 30 tablet 5   B Complex-C (SUPER B COMPLEX PO) Take 1 capsule by mouth daily. (Patient not taking: Reported on 03/28/2024)     chlorthalidone (HYGROTON) 25 MG tablet Take 25 mg by mouth daily.     clopidogrel  (PLAVIX ) 75 MG tablet Take 1 tablet (75 mg total) by mouth daily. (Patient not taking: Reported on 03/28/2024) 30 tablet 11   diazepam  (VALIUM ) 2 MG tablet Take 2 mg by mouth at bedtime. (Patient not taking: Reported on 03/28/2024)  3   fluticasone (FLONASE) 50 MCG/ACT nasal spray Place 1-2 sprays into both nostrils daily as needed for allergies. (Patient not taking: Reported on 03/28/2024)     Magnesium 500 MG TABS Take 500 mg by mouth daily as needed (for migraine headaches.). (Patient not taking: Reported on 03/28/2024)     No current facility-administered medications on file prior to visit.    There are no Patient Instructions on file for this visit. No follow-ups on file.   Rosabelle Jupin E Terin Cragle, NP

## 2024-03-31 LAB — VAS US ABI WITH/WO TBI
Left ABI: 1.19
Right ABI: 1.09

## 2024-05-30 ENCOUNTER — Other Ambulatory Visit: Payer: Self-pay | Admitting: Pediatrics

## 2024-05-30 DIAGNOSIS — R911 Solitary pulmonary nodule: Secondary | ICD-10-CM

## 2024-05-30 DIAGNOSIS — R0602 Shortness of breath: Secondary | ICD-10-CM

## 2024-06-18 ENCOUNTER — Other Ambulatory Visit (INDEPENDENT_AMBULATORY_CARE_PROVIDER_SITE_OTHER): Payer: Self-pay | Admitting: Nurse Practitioner

## 2024-06-18 ENCOUNTER — Telehealth (INDEPENDENT_AMBULATORY_CARE_PROVIDER_SITE_OTHER): Payer: Self-pay

## 2024-06-18 DIAGNOSIS — Z9889 Other specified postprocedural states: Secondary | ICD-10-CM

## 2024-06-18 NOTE — Telephone Encounter (Signed)
 Patient called into nurse line to report that she had awaken yesterday with lower leg coldness and numbness that has continued into today, she reports that leg feels week when she is walking, she further reports that she had a left leg angioplasty done this past August. She reports she is to follow up with her primary care as well on tomorrow but wanted to know if she should be concerned about this. Please advise

## 2024-06-18 NOTE — Telephone Encounter (Signed)
 Well she has an appointment coming soon, we can see if there is availability to move it up sooner

## 2024-06-19 NOTE — Telephone Encounter (Signed)
 Patient called back to nurse line at this time and stated she seen her PCP and they recommended she see vascular due to the pain, numbness, and coldness in her L leg. Patient comes in next Friday 12/12, is there anywhere to move her to before then? If not she can keep 12/12.

## 2024-06-23 ENCOUNTER — Telehealth (INDEPENDENT_AMBULATORY_CARE_PROVIDER_SITE_OTHER): Payer: Self-pay

## 2024-06-23 NOTE — Telephone Encounter (Signed)
 I think they were trying to move up her office visit

## 2024-06-23 NOTE — Telephone Encounter (Signed)
 Patient called into after hours triage line on 06/21/24 with complaint of left leg numbness, tingling and coldness, she reports difficulty ambulating. Pain present when lying down Per note patient had recent angioplasty done. Please advise

## 2024-06-25 ENCOUNTER — Encounter (INDEPENDENT_AMBULATORY_CARE_PROVIDER_SITE_OTHER): Payer: Self-pay

## 2024-06-25 ENCOUNTER — Ambulatory Visit (INDEPENDENT_AMBULATORY_CARE_PROVIDER_SITE_OTHER)

## 2024-06-25 ENCOUNTER — Ambulatory Visit (INDEPENDENT_AMBULATORY_CARE_PROVIDER_SITE_OTHER): Admitting: Nurse Practitioner

## 2024-06-25 ENCOUNTER — Other Ambulatory Visit (INDEPENDENT_AMBULATORY_CARE_PROVIDER_SITE_OTHER)

## 2024-06-25 ENCOUNTER — Telehealth (INDEPENDENT_AMBULATORY_CARE_PROVIDER_SITE_OTHER): Payer: Self-pay

## 2024-06-25 ENCOUNTER — Encounter (INDEPENDENT_AMBULATORY_CARE_PROVIDER_SITE_OTHER): Payer: Self-pay | Admitting: Nurse Practitioner

## 2024-06-25 VITALS — BP 143/85 | HR 77 | Resp 18 | Ht 63.0 in | Wt 120.0 lb

## 2024-06-25 DIAGNOSIS — Z9889 Other specified postprocedural states: Secondary | ICD-10-CM

## 2024-06-25 DIAGNOSIS — I739 Peripheral vascular disease, unspecified: Secondary | ICD-10-CM

## 2024-06-25 DIAGNOSIS — E785 Hyperlipidemia, unspecified: Secondary | ICD-10-CM | POA: Diagnosis not present

## 2024-06-25 DIAGNOSIS — I70222 Atherosclerosis of native arteries of extremities with rest pain, left leg: Secondary | ICD-10-CM

## 2024-06-25 DIAGNOSIS — I10 Essential (primary) hypertension: Secondary | ICD-10-CM

## 2024-06-25 MED ORDER — TRAMADOL HCL 50 MG PO TABS: 50.0000 mg | ORAL_TABLET | Freq: Four times a day (QID) | ORAL | 0 refills | Status: AC | PRN

## 2024-06-25 NOTE — Telephone Encounter (Signed)
 Spoke with the patient and she is scheduled with Dr. Marea for a LLE angio with a 11:45 am arrival time to the [facility]. Pre-procedure instructions were discussed and patient stated she wrote them down. Patient does not have Mychart.

## 2024-06-25 NOTE — H&P (View-Only) (Signed)
 Subjective:    Patient ID: Megan Irwin, female    DOB: [birth year], 78 y.o.   MRN: [id number] Chief Complaint  Patient presents with   Follow-up    3 month follow up + ABI + Bilateral Art. Duplex ref. Behling    HPI  Discussed the use of AI scribe software for clinical note transcription with the patient, who gave verbal consent to proceed.  History of Present Illness Megan Irwin is an 78 year old female with a history of stent placement who presents with acute leg pain and numbness.  She has a previous history of peripheral vascular intervention in August.  Her symptoms began abruptly a week ago on Tuesday, when she woke up with her leg feeling 'ice cold' and numb. She describes the sensation as feeling like 'somebody is shooting me in the leg' with a BB gun, which persisted for about an hour. The pain subsided after she sat up for two to three minutes, but the leg remained numb.  She has a history of stent placement and was previously on Plavix , which she discontinued after experiencing severe side effects, including feeling extremely unwell and being unable to walk to the bathroom. Her symptoms improved after stopping Plavix , but she has been unable to take Eliquis  due to cost. She currently takes two regular aspirin  daily, spaced out between morning and evening.  She experiences significant pain at night, which is alleviated by getting up during the day. Her toes are currently very numb, and she describes the numbness as being on the outside of her leg. She has a history of her husband being addicted to oxycodone and is afraid to use it herself, although she has a bottle at home. She prefers to use tramadol , which she has used before and found effective.  Her husband passed away in 2023/08/09, and she has been experiencing difficulty with sleep due to the leg pain. She mentions that her leg gets cold and jerks at night, and she finds it difficult to warm it up. She has been through  several nights of being up all night due to the pain.  No fever. Reports leg pain, numbness, and cold sensation. Pain is worse at night and improves with activity during the day.    Results RADIOLOGY Ultrasound: LLE arterial  Stents are occluded.  Right lower extremity stents are widely patent   Review of Systems  Neurological:  Positive for numbness.  All other systems reviewed and are negative.      Objective:   Physical Exam Vitals reviewed.  HENT:     Head: Normocephalic.  Cardiovascular:     Rate and Rhythm: Normal rate.     Pulses:          Dorsalis pedis pulses are detected w/ Doppler on the right side.       Posterior tibial pulses are detected w/ Doppler on the right side.  Pulmonary:     Effort: Pulmonary effort is normal.  Skin:    General: Skin is dry.     Comments: Cool left foot  Neurological:     Mental Status: She is alert and oriented to person, place, and time.  Psychiatric:        Mood and Affect: Mood normal.        Thought Content: Thought content normal.        Judgment: Judgment normal.     Physical Exam    BP (!) 143/85 (BP Location: Left Arm, Patient Position:  Sitting, Cuff Size: Normal)   Pulse 77   Resp 18   Ht 5' 3 (1.6 m)   Wt 120 lb (54.4 kg)   BMI 21.26 kg/m   Past Medical History:  Diagnosis Date   Anxiety    Cholelithiasis 09/2017   Dysrhythmia 2019   skipping a beat with palpatations   GERD (gastroesophageal reflux disease)    food gets stuck like an esophagitis   Hypertension    Insomnia    Migraines    magnesium helps   NAFL (nonalcoholic fatty liver)    Restless leg syndrome    uses heating pad to resolve   Sciatica 2019   pt is in a good place after getting an SI joint injection    Social History   Socioeconomic History   Marital status: Married    Spouse name: Not on file   Number of children: Not on file   Years of education: Not on file   Highest education level: Not on file  Occupational  History   Not on file  Tobacco Use   Smoking status: Former    Current packs/day: 0.00    Types: E-cigarettes, Cigarettes    Quit date: 08/2019    Years since quitting: 4.8   Smokeless tobacco: Never  Vaping Use   Vaping status: Every Day   Devices: e-cigarette  Substance and Sexual Activity   Alcohol use: No    Comment: can not tolerate alchohol   Drug use: No   Sexual activity: Not on file  Other Topics Concern   Not on file  Social History Narrative   Not on file   Social Drivers of Health   Financial Resource Strain: Low Risk  (12/25/2023)   Received from [facility]   Overall Financial Resource Strain (CARDIA)    Difficulty of Paying Living Expenses: Not very hard  Food Insecurity: No Food Insecurity (12/25/2023)   Received from [facility]   Hunger Vital Sign    Within the past 12 months, you worried that your food would run out before you got the money to buy more.: Never true    Within the past 12 months, the food you bought just didn't last and you didn't have money to get more.: Never true  Transportation Needs: No Transportation Needs (12/25/2023)   Received from [facility] - Transportation    In the past 12 months, has lack of transportation kept you from medical appointments or from getting medications?: No    Lack of Transportation (Non-Medical): No  Physical Activity: Not on file  Stress: Not on file  Social Connections: Not on file  Intimate Partner Violence: Not on file    Past Surgical History:  Procedure Laterality Date   CHOLECYSTECTOMY N/A 10/03/2017   Procedure: LAPAROSCOPIC CHOLECYSTECTOMY;  Surgeon: Rodolph Romano, MD;  Location: [facility] ORS;  Service: General;  Laterality: N/A;   COLONOSCOPY     LOWER EXTREMITY ANGIOGRAPHY Left 02/25/2024   Procedure: Lower Extremity Angiography;  Surgeon: Marea Selinda RAMAN, MD;  Location: [facility];  Service: Cardiovascular;  Laterality:  Left;   LOWER EXTREMITY ANGIOGRAPHY Right 03/03/2024   Procedure: Lower Extremity Angiography;  Surgeon: Marea Selinda RAMAN, MD;  Location: [facility];  Service: Cardiovascular;  Laterality: Right;   TONSILLECTOMY     TUBAL LIGATION      Family History  Problem Relation Age of Onset   Pneumonia Mother    Hypertension  Mother    Heart attack Father 102    Allergies  Allergen Reactions   Calcium  Channel Blockers Shortness Of Breath, Anxiety, Palpitations and Other (See Comments)    Cognitive effects also--difficulty thinking   Hydrochlorothiazide Shortness Of Breath   Sodium Metabisulfite Anaphylaxis    Sulfite--in foods   Sulfites Swelling    Can't breathe   Telmisartan-Hctz Shortness Of Breath    Chest pain & fatigue    Cardizem [Diltiazem Hcl] Other (See Comments)    Difficulty eating, sleeping, and concentrating Heart goes crazy    Sulfa Antibiotics Hives   Acyclovir And Related    Plavix  [Clopidogrel ]    Lexapro [Escitalopram Oxalate] Other (See Comments)    Gi upset       Latest Ref Rng & Units 08/30/2017    4:44 PM 12/13/2016    1:33 PM  CBC  WBC 3.6 - 11.0 K/uL 8.9  7.5   Hemoglobin 12.0 - 16.0 g/dL 85.0  85.6   Hematocrit 35.0 - 47.0 % 43.9  42.8   Platelets 150 - 440 K/uL 233  245       CMP     Component Value Date/Time   NA 138 08/30/2017 1644   K 4.3 08/30/2017 1644   CL 103 08/30/2017 1644   CO2 27 08/30/2017 1644   GLUCOSE 109 (H) 08/30/2017 1644   BUN 37 (H) 03/03/2024 0741   CREATININE 1.36 (H) 03/03/2024 0741   CALCIUM  9.3 08/30/2017 1644   PROT 7.5 08/30/2017 1644   ALBUMIN 4.4 08/30/2017 1644   AST 22 08/30/2017 1644   ALT 22 08/30/2017 1644   ALKPHOS 117 08/30/2017 1644   BILITOT 0.7 08/30/2017 1644   GFRNONAA 39 (L) 03/03/2024 0741     No results found.     Assessment & Plan:   1. 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 Previous Plavix  use caused severe illness. Current aspirin  regimen is insufficient. Immediate intervention required to prevent worsening symptoms and potential permanent damage. - Scheduled angiogram and potential stent intervention with Dr. Marea, aiming for tomorrow if possible. -Discussed risk, benefit and alternatives for intervention and the patient is agreeable to proceed with angiogram. - Provided tramadol  for pain management, with the option to double the dose if necessary. - Discussed potential use of oxycodone for severe pain, with caution due to addiction risk. - Provided a 30-day free card for Eliquis  and samples to assess tolerance post-procedure. - Will coordinate with insurance for procedure approval. - Advised sleeping with leg dangling to alleviate symptoms. - Discussed potential overnight stay post-procedure if high-powered anticoagulation is required.  2. Essential hypertension Continue antihypertensive medications as already ordered, these medications have been reviewed and there are no changes at this time.  3. Dyslipidemia Continue statin as ordered and reviewed, no changes at this time   Assessment and Plan Assessment & Plan      Current Outpatient Medications on File Prior to Visit  Medication Sig Dispense Refill   apixaban  (ELIQUIS ) 5 MG TABS tablet Take 1 tablet (5 mg total) by mouth 2 (two) times daily. (Patient not taking: Reported on 06/25/2024) 60 tablet 3   aspirin  EC 81 MG tablet Take 1 tablet (81 mg total) by mouth daily. Swallow whole. (Patient taking differently: Take 81 mg by mouth 2 (two) times daily.  Swallow whole. One in the morning and one in the evening.) 150 tablet 2   atenolol (TENORMIN) 100 MG tablet Take 100 mg by mouth daily. In the morning     Calcium  Magnesium Zinc 333-133-5 MG TABS Take 1 tablet by mouth daily.      Chlorpheniramine-DM (CORICIDIN HBP COUGH/COLD PO) Take 1 tablet by mouth 2 (two) times daily as needed (for cold symptoms.).     chlorthalidone (HYGROTON) 25 MG tablet Take 25 mg by mouth daily.     gabapentin (NEURONTIN) 300 MG capsule Take 300 mg by mouth daily.     tizanidine (ZANAFLEX) 2 MG capsule Take 2 mg by mouth at bedtime as needed for muscle spasms. Take a 1/2 tablet     valsartan (DIOVAN) 160 MG tablet Take 160 mg by mouth daily.     atorvastatin  (LIPITOR) 20 MG tablet Take 1 tablet (20 mg total) by mouth daily at 6 PM. Patient only takes half a tablet most times (Patient not taking: Reported on 06/25/2024) 30 tablet 5   B Complex-C (SUPER B COMPLEX PO) Take 1 capsule by mouth daily. (Patient not taking: Reported on 06/25/2024)     clopidogrel  (PLAVIX ) 75 MG tablet Take 1 tablet (75 mg total) by mouth daily. (Patient not taking: Reported on 03/28/2024) 30 tablet 11   diazepam  (VALIUM ) 2 MG tablet Take 2 mg by mouth at bedtime. (Patient not taking: Reported on 06/25/2024)  3   fluticasone (FLONASE) 50 MCG/ACT nasal spray Place 1-2 sprays into both nostrils daily as needed for allergies. (Patient not taking: Reported on 03/28/2024)     Magnesium 500 MG TABS Take 500 mg by mouth daily as needed (for migraine headaches.). (Patient not taking: Reported on 06/25/2024)     No current facility-administered medications on file prior to visit.    There are no Patient Instructions on file for this visit. No follow-ups on file.   Malacki Mcphearson E Orie Baxendale, NP

## 2024-06-25 NOTE — Progress Notes (Signed)
 Subjective:    Patient ID: Megan Irwin, female    DOB: [birth year], 78 y.o.   MRN: [id number] Chief Complaint  Patient presents with   Follow-up    3 month follow up + ABI + Bilateral Art. Duplex ref. Behling    HPI  Discussed the use of AI scribe software for clinical note transcription with the patient, who gave verbal consent to proceed.  History of Present Illness Megan Irwin is an 78 year old female with a history of stent placement who presents with acute leg pain and numbness.  She has a previous history of peripheral vascular intervention in August.  Her symptoms began abruptly a week ago on Tuesday, when she woke up with her leg feeling 'ice cold' and numb. She describes the sensation as feeling like 'somebody is shooting me in the leg' with a BB gun, which persisted for about an hour. The pain subsided after she sat up for two to three minutes, but the leg remained numb.  She has a history of stent placement and was previously on Plavix , which she discontinued after experiencing severe side effects, including feeling extremely unwell and being unable to walk to the bathroom. Her symptoms improved after stopping Plavix , but she has been unable to take Eliquis  due to cost. She currently takes two regular aspirin  daily, spaced out between morning and evening.  She experiences significant pain at night, which is alleviated by getting up during the day. Her toes are currently very numb, and she describes the numbness as being on the outside of her leg. She has a history of her husband being addicted to oxycodone and is afraid to use it herself, although she has a bottle at home. She prefers to use tramadol , which she has used before and found effective.  Her husband passed away in 2023/08/09, and she has been experiencing difficulty with sleep due to the leg pain. She mentions that her leg gets cold and jerks at night, and she finds it difficult to warm it up. She has been through  several nights of being up all night due to the pain.  No fever. Reports leg pain, numbness, and cold sensation. Pain is worse at night and improves with activity during the day.    Results RADIOLOGY Ultrasound: LLE arterial  Stents are occluded.  Right lower extremity stents are widely patent   Review of Systems  Neurological:  Positive for numbness.  All other systems reviewed and are negative.      Objective:   Physical Exam Vitals reviewed.  HENT:     Head: Normocephalic.  Cardiovascular:     Rate and Rhythm: Normal rate.     Pulses:          Dorsalis pedis pulses are detected w/ Doppler on the right side.       Posterior tibial pulses are detected w/ Doppler on the right side.  Pulmonary:     Effort: Pulmonary effort is normal.  Skin:    General: Skin is dry.     Comments: Cool left foot  Neurological:     Mental Status: She is alert and oriented to person, place, and time.  Psychiatric:        Mood and Affect: Mood normal.        Thought Content: Thought content normal.        Judgment: Judgment normal.     Physical Exam    BP (!) 143/85 (BP Location: Left Arm, Patient Position:  Sitting, Cuff Size: Normal)   Pulse 77   Resp 18   Ht 5' 3 (1.6 m)   Wt 120 lb (54.4 kg)   BMI 21.26 kg/m   Past Medical History:  Diagnosis Date   Anxiety    Cholelithiasis 09/2017   Dysrhythmia 2019   skipping a beat with palpatations   GERD (gastroesophageal reflux disease)    food gets stuck like an esophagitis   Hypertension    Insomnia    Migraines    magnesium helps   NAFL (nonalcoholic fatty liver)    Restless leg syndrome    uses heating pad to resolve   Sciatica 2019   pt is in a good place after getting an SI joint injection    Social History   Socioeconomic History   Marital status: Married    Spouse name: Not on file   Number of children: Not on file   Years of education: Not on file   Highest education level: Not on file  Occupational  History   Not on file  Tobacco Use   Smoking status: Former    Current packs/day: 0.00    Types: E-cigarettes, Cigarettes    Quit date: 08/2019    Years since quitting: 4.8   Smokeless tobacco: Never  Vaping Use   Vaping status: Every Day   Devices: e-cigarette  Substance and Sexual Activity   Alcohol use: No    Comment: can not tolerate alchohol   Drug use: No   Sexual activity: Not on file  Other Topics Concern   Not on file  Social History Narrative   Not on file   Social Drivers of Health   Financial Resource Strain: Low Risk  (12/25/2023)   Received from [facility]   Overall Financial Resource Strain (CARDIA)    Difficulty of Paying Living Expenses: Not very hard  Food Insecurity: No Food Insecurity (12/25/2023)   Received from [facility]   Hunger Vital Sign    Within the past 12 months, you worried that your food would run out before you got the money to buy more.: Never true    Within the past 12 months, the food you bought just didn't last and you didn't have money to get more.: Never true  Transportation Needs: No Transportation Needs (12/25/2023)   Received from [facility] - Transportation    In the past 12 months, has lack of transportation kept you from medical appointments or from getting medications?: No    Lack of Transportation (Non-Medical): No  Physical Activity: Not on file  Stress: Not on file  Social Connections: Not on file  Intimate Partner Violence: Not on file    Past Surgical History:  Procedure Laterality Date   CHOLECYSTECTOMY N/A 10/03/2017   Procedure: LAPAROSCOPIC CHOLECYSTECTOMY;  Surgeon: Rodolph Romano, MD;  Location: [facility] ORS;  Service: General;  Laterality: N/A;   COLONOSCOPY     LOWER EXTREMITY ANGIOGRAPHY Left 02/25/2024   Procedure: Lower Extremity Angiography;  Surgeon: Marea Selinda RAMAN, MD;  Location: [facility];  Service: Cardiovascular;  Laterality:  Left;   LOWER EXTREMITY ANGIOGRAPHY Right 03/03/2024   Procedure: Lower Extremity Angiography;  Surgeon: Marea Selinda RAMAN, MD;  Location: [facility];  Service: Cardiovascular;  Laterality: Right;   TONSILLECTOMY     TUBAL LIGATION      Family History  Problem Relation Age of Onset   Pneumonia Mother    Hypertension  Mother    Heart attack Father 102    Allergies  Allergen Reactions   Calcium  Channel Blockers Shortness Of Breath, Anxiety, Palpitations and Other (See Comments)    Cognitive effects also--difficulty thinking   Hydrochlorothiazide Shortness Of Breath   Sodium Metabisulfite Anaphylaxis    Sulfite--in foods   Sulfites Swelling    Can't breathe   Telmisartan-Hctz Shortness Of Breath    Chest pain & fatigue    Cardizem [Diltiazem Hcl] Other (See Comments)    Difficulty eating, sleeping, and concentrating Heart goes crazy    Sulfa Antibiotics Hives   Acyclovir And Related    Plavix  [Clopidogrel ]    Lexapro [Escitalopram Oxalate] Other (See Comments)    Gi upset       Latest Ref Rng & Units 08/30/2017    4:44 PM 12/13/2016    1:33 PM  CBC  WBC 3.6 - 11.0 K/uL 8.9  7.5   Hemoglobin 12.0 - 16.0 g/dL 85.0  85.6   Hematocrit 35.0 - 47.0 % 43.9  42.8   Platelets 150 - 440 K/uL 233  245       CMP     Component Value Date/Time   NA 138 08/30/2017 1644   K 4.3 08/30/2017 1644   CL 103 08/30/2017 1644   CO2 27 08/30/2017 1644   GLUCOSE 109 (H) 08/30/2017 1644   BUN 37 (H) 03/03/2024 0741   CREATININE 1.36 (H) 03/03/2024 0741   CALCIUM  9.3 08/30/2017 1644   PROT 7.5 08/30/2017 1644   ALBUMIN 4.4 08/30/2017 1644   AST 22 08/30/2017 1644   ALT 22 08/30/2017 1644   ALKPHOS 117 08/30/2017 1644   BILITOT 0.7 08/30/2017 1644   GFRNONAA 39 (L) 03/03/2024 0741     No results found.     Assessment & Plan:   1. 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 Previous Plavix  use caused severe illness. Current aspirin  regimen is insufficient. Immediate intervention required to prevent worsening symptoms and potential permanent damage. - Scheduled angiogram and potential stent intervention with Dr. Marea, aiming for tomorrow if possible. -Discussed risk, benefit and alternatives for intervention and the patient is agreeable to proceed with angiogram. - Provided tramadol  for pain management, with the option to double the dose if necessary. - Discussed potential use of oxycodone for severe pain, with caution due to addiction risk. - Provided a 30-day free card for Eliquis  and samples to assess tolerance post-procedure. - Will coordinate with insurance for procedure approval. - Advised sleeping with leg dangling to alleviate symptoms. - Discussed potential overnight stay post-procedure if high-powered anticoagulation is required.  2. Essential hypertension Continue antihypertensive medications as already ordered, these medications have been reviewed and there are no changes at this time.  3. Dyslipidemia Continue statin as ordered and reviewed, no changes at this time   Assessment and Plan Assessment & Plan      Current Outpatient Medications on File Prior to Visit  Medication Sig Dispense Refill   apixaban  (ELIQUIS ) 5 MG TABS tablet Take 1 tablet (5 mg total) by mouth 2 (two) times daily. (Patient not taking: Reported on 06/25/2024) 60 tablet 3   aspirin  EC 81 MG tablet Take 1 tablet (81 mg total) by mouth daily. Swallow whole. (Patient taking differently: Take 81 mg by mouth 2 (two) times daily.  Swallow whole. One in the morning and one in the evening.) 150 tablet 2   atenolol (TENORMIN) 100 MG tablet Take 100 mg by mouth daily. In the morning     Calcium  Magnesium Zinc 333-133-5 MG TABS Take 1 tablet by mouth daily.      Chlorpheniramine-DM (CORICIDIN HBP COUGH/COLD PO) Take 1 tablet by mouth 2 (two) times daily as needed (for cold symptoms.).     chlorthalidone (HYGROTON) 25 MG tablet Take 25 mg by mouth daily.     gabapentin (NEURONTIN) 300 MG capsule Take 300 mg by mouth daily.     tizanidine (ZANAFLEX) 2 MG capsule Take 2 mg by mouth at bedtime as needed for muscle spasms. Take a 1/2 tablet     valsartan (DIOVAN) 160 MG tablet Take 160 mg by mouth daily.     atorvastatin  (LIPITOR) 20 MG tablet Take 1 tablet (20 mg total) by mouth daily at 6 PM. Patient only takes half a tablet most times (Patient not taking: Reported on 06/25/2024) 30 tablet 5   B Complex-C (SUPER B COMPLEX PO) Take 1 capsule by mouth daily. (Patient not taking: Reported on 06/25/2024)     clopidogrel  (PLAVIX ) 75 MG tablet Take 1 tablet (75 mg total) by mouth daily. (Patient not taking: Reported on 03/28/2024) 30 tablet 11   diazepam  (VALIUM ) 2 MG tablet Take 2 mg by mouth at bedtime. (Patient not taking: Reported on 06/25/2024)  3   fluticasone (FLONASE) 50 MCG/ACT nasal spray Place 1-2 sprays into both nostrils daily as needed for allergies. (Patient not taking: Reported on 03/28/2024)     Magnesium 500 MG TABS Take 500 mg by mouth daily as needed (for migraine headaches.). (Patient not taking: Reported on 06/25/2024)     No current facility-administered medications on file prior to visit.    There are no Patient Instructions on file for this visit. No follow-ups on file.   Malacki Mcphearson E Orie Baxendale, NP

## 2024-06-26 ENCOUNTER — Encounter: Payer: Self-pay | Admitting: Vascular Surgery

## 2024-06-26 ENCOUNTER — Encounter: Admission: AD | Disposition: A | Payer: Self-pay | Source: Home / Self Care | Attending: Vascular Surgery

## 2024-06-26 ENCOUNTER — Inpatient Hospital Stay
Admission: AD | Admit: 2024-06-26 | Discharge: 2024-06-30 | DRG: 271 | Disposition: A | Attending: Vascular Surgery | Admitting: Vascular Surgery

## 2024-06-26 ENCOUNTER — Other Ambulatory Visit: Payer: Self-pay

## 2024-06-26 DIAGNOSIS — I998 Other disorder of circulatory system: Principal | ICD-10-CM | POA: Diagnosis present

## 2024-06-26 DIAGNOSIS — K219 Gastro-esophageal reflux disease without esophagitis: Secondary | ICD-10-CM | POA: Diagnosis present

## 2024-06-26 DIAGNOSIS — G4700 Insomnia, unspecified: Secondary | ICD-10-CM | POA: Diagnosis present

## 2024-06-26 DIAGNOSIS — Z7901 Long term (current) use of anticoagulants: Secondary | ICD-10-CM | POA: Diagnosis not present

## 2024-06-26 DIAGNOSIS — E785 Hyperlipidemia, unspecified: Secondary | ICD-10-CM | POA: Diagnosis present

## 2024-06-26 DIAGNOSIS — I77819 Aortic ectasia, unspecified site: Secondary | ICD-10-CM

## 2024-06-26 DIAGNOSIS — G2581 Restless legs syndrome: Secondary | ICD-10-CM | POA: Diagnosis present

## 2024-06-26 DIAGNOSIS — Z9889 Other specified postprocedural states: Secondary | ICD-10-CM | POA: Diagnosis not present

## 2024-06-26 DIAGNOSIS — F17290 Nicotine dependence, other tobacco product, uncomplicated: Secondary | ICD-10-CM | POA: Diagnosis present

## 2024-06-26 DIAGNOSIS — T82868A Thrombosis of vascular prosthetic devices, implants and grafts, initial encounter: Principal | ICD-10-CM

## 2024-06-26 DIAGNOSIS — F419 Anxiety disorder, unspecified: Secondary | ICD-10-CM | POA: Diagnosis present

## 2024-06-26 DIAGNOSIS — I10 Essential (primary) hypertension: Secondary | ICD-10-CM | POA: Diagnosis present

## 2024-06-26 DIAGNOSIS — Z634 Disappearance and death of family member: Secondary | ICD-10-CM | POA: Diagnosis not present

## 2024-06-26 DIAGNOSIS — Z8249 Family history of ischemic heart disease and other diseases of the circulatory system: Secondary | ICD-10-CM | POA: Diagnosis not present

## 2024-06-26 DIAGNOSIS — D62 Acute posthemorrhagic anemia: Secondary | ICD-10-CM | POA: Diagnosis not present

## 2024-06-26 DIAGNOSIS — Z7982 Long term (current) use of aspirin: Secondary | ICD-10-CM | POA: Diagnosis not present

## 2024-06-26 DIAGNOSIS — Y828 Other medical devices associated with adverse incidents: Secondary | ICD-10-CM | POA: Diagnosis present

## 2024-06-26 DIAGNOSIS — I743 Embolism and thrombosis of arteries of the lower extremities: Secondary | ICD-10-CM

## 2024-06-26 DIAGNOSIS — I70222 Atherosclerosis of native arteries of extremities with rest pain, left leg: Secondary | ICD-10-CM

## 2024-06-26 DIAGNOSIS — Z79899 Other long term (current) drug therapy: Secondary | ICD-10-CM | POA: Diagnosis not present

## 2024-06-26 HISTORY — PX: LOWER EXTREMITY INTERVENTION: CATH118252

## 2024-06-26 LAB — CBC
HCT: 29.4 % — ABNORMAL LOW (ref 36.0–46.0)
HCT: 34 % — ABNORMAL LOW (ref 36.0–46.0)
Hemoglobin: 9.2 g/dL — ABNORMAL LOW (ref 12.0–15.0)
Hemoglobin: 10.7 g/dL — ABNORMAL LOW (ref 12.0–15.0)
MCH: 28.6 pg (ref 26.0–34.0)
MCH: 28.7 pg (ref 26.0–34.0)
MCHC: 31.3 g/dL (ref 30.0–36.0)
MCHC: 31.5 g/dL (ref 30.0–36.0)
MCV: 91.3 fL (ref 80.0–100.0)
MCV: 91.2 fL (ref 80.0–100.0)
Platelets: 336 K/uL (ref 150–400)
Platelets: 438 K/uL — ABNORMAL HIGH (ref 150–400)
RBC: 3.22 MIL/uL — ABNORMAL LOW (ref 3.87–5.11)
RBC: 3.73 MIL/uL — ABNORMAL LOW (ref 3.87–5.11)
RDW: 14 % (ref 11.5–15.5)
RDW: 14.4 % (ref 11.5–15.5)
WBC: 10.1 K/uL (ref 4.0–10.5)
WBC: 25.7 K/uL — ABNORMAL HIGH (ref 4.0–10.5)
nRBC: 0 % (ref 0.0–0.2)
nRBC: 0 % (ref 0.0–0.2)

## 2024-06-26 LAB — HEPARIN LEVEL (UNFRACTIONATED)
Heparin Unfractionated: 0.97 [IU]/mL — ABNORMAL HIGH (ref 0.30–0.70)
Heparin Unfractionated: 0.6 [IU]/mL (ref 0.30–0.70)

## 2024-06-26 LAB — MRSA NEXT GEN BY PCR, NASAL: MRSA by PCR Next Gen: NOT DETECTED

## 2024-06-26 LAB — CREATININE, SERUM
Creatinine, Ser: 1.35 mg/dL — ABNORMAL HIGH (ref 0.44–1.00)
GFR, Estimated: 40 mL/min — ABNORMAL LOW (ref >60)

## 2024-06-26 LAB — FIBRINOGEN
Fibrinogen: 386 mg/dL (ref 210–475)
Fibrinogen: 386 mg/dL (ref 210–475)

## 2024-06-26 LAB — BUN: BUN: 41 mg/dL — ABNORMAL HIGH (ref 8–23)

## 2024-06-26 SURGERY — LOWER EXTREMITY INTERVENTION
Anesthesia: Moderate Sedation | Site: Leg Lower | Laterality: Left

## 2024-06-26 MED ORDER — FLUTICASONE PROPIONATE 50 MCG/ACT NA SUSP: 1.0000 | Freq: Every day | NASAL | Status: DC | PRN

## 2024-06-26 MED ORDER — SODIUM CHLORIDE 0.9% FLUSH
3.0000 mL | Freq: Two times a day (BID) | INTRAVENOUS | Status: DC
  Administered 2024-06-26: 3 mL via INTRAVENOUS

## 2024-06-26 MED ORDER — DIPHENHYDRAMINE HCL 50 MG/ML IJ SOLN: 50.0000 mg | Freq: Once | INTRAMUSCULAR | Status: DC | PRN

## 2024-06-26 MED ORDER — STERILE WATER FOR INJECTION IJ SOLN
INTRAMUSCULAR | Status: AC
  Filled 2024-06-26: qty 10

## 2024-06-26 MED ORDER — FENTANYL CITRATE (PF) 100 MCG/2ML IJ SOLN
INTRAMUSCULAR | Status: DC | PRN
  Administered 2024-06-26: 50 ug via INTRAVENOUS
  Administered 2024-06-26: 25 ug via INTRAVENOUS

## 2024-06-26 MED ORDER — HYDROMORPHONE HCL 1 MG/ML IJ SOLN
INTRAMUSCULAR | Status: AC
  Filled 2024-06-26: qty 0.5

## 2024-06-26 MED ORDER — IODIXANOL 320 MG/ML IV SOLN
INTRAVENOUS | Status: DC | PRN
  Administered 2024-06-26: 50 mL

## 2024-06-26 MED ORDER — CALCIUM MAGNESIUM ZINC 333-133-5 MG PO TABS: 1.0000 | ORAL_TABLET | Freq: Every day | ORAL | Status: DC

## 2024-06-26 MED ORDER — MIDAZOLAM HCL 5 MG/5ML IJ SOLN
INTRAMUSCULAR | Status: AC
  Filled 2024-06-26: qty 5

## 2024-06-26 MED ORDER — ONDANSETRON HCL 4 MG/2ML IJ SOLN: 4.0000 mg | Freq: Four times a day (QID) | INTRAMUSCULAR | Status: DC | PRN

## 2024-06-26 MED ORDER — ALTEPLASE 2 MG IJ SOLR
INTRAMUSCULAR | Status: DC | PRN
  Administered 2024-06-26: 8 mg

## 2024-06-26 MED ORDER — IRBESARTAN 150 MG PO TABS
150.0000 mg | ORAL_TABLET | Freq: Every day | ORAL | Status: DC
  Administered 2024-06-28 – 2024-06-30 (×3): 150 mg via ORAL
  Filled 2024-06-26 (×3): qty 1

## 2024-06-26 MED ORDER — GABAPENTIN 300 MG PO CAPS
300.0000 mg | ORAL_CAPSULE | Freq: Every day | ORAL | Status: DC
  Administered 2024-06-26 – 2024-06-29 (×4): 300 mg via ORAL
  Filled 2024-06-26 (×4): qty 1

## 2024-06-26 MED ORDER — HYDRALAZINE HCL 20 MG/ML IJ SOLN
10.0000 mg | INTRAMUSCULAR | Status: DC | PRN
  Administered 2024-06-26 (×2): 10 mg via INTRAVENOUS
  Filled 2024-06-26: qty 1

## 2024-06-26 MED ORDER — SODIUM CHLORIDE 0.9 % IV SOLN
0.5000 mg/h | INTRAVENOUS | Status: DC
  Filled 2024-06-26 (×2): qty 10

## 2024-06-26 MED ORDER — ALTEPLASE 2 MG IJ SOLR
INTRAMUSCULAR | Status: AC
  Filled 2024-06-26: qty 8

## 2024-06-26 MED ORDER — FAMOTIDINE 20 MG PO TABS: 40.0000 mg | ORAL_TABLET | Freq: Once | ORAL | Status: DC | PRN

## 2024-06-26 MED ORDER — SODIUM CHLORIDE 0.9 % IV SOLN
1.0000 mg/h | INTRAVENOUS | Status: AC
  Administered 2024-06-26: 1 mg/h
  Filled 2024-06-26: qty 10

## 2024-06-26 MED ORDER — ATENOLOL 25 MG PO TABS
100.0000 mg | ORAL_TABLET | Freq: Every day | ORAL | Status: DC
  Administered 2024-06-28 – 2024-06-30 (×3): 100 mg via ORAL
  Filled 2024-06-26: qty 4
  Filled 2024-06-26: qty 1
  Filled 2024-06-26 (×2): qty 4

## 2024-06-26 MED ORDER — METHYLPREDNISOLONE SODIUM SUCC 125 MG IJ SOLR: 125.0000 mg | Freq: Once | INTRAMUSCULAR | Status: DC | PRN

## 2024-06-26 MED ORDER — HYDROMORPHONE HCL 1 MG/ML IJ SOLN
1.0000 mg | INTRAMUSCULAR | Status: DC | PRN
  Administered 2024-06-26: 1 mg via INTRAVENOUS
  Filled 2024-06-26 (×2): qty 1

## 2024-06-26 MED ORDER — HEPARIN (PORCINE) IN NACL 2000-0.9 UNIT/L-% IV SOLN
INTRAVENOUS | Status: DC | PRN
  Administered 2024-06-26: 1000 mL

## 2024-06-26 MED ORDER — CHLORTHALIDONE 25 MG PO TABS
25.0000 mg | ORAL_TABLET | Freq: Every day | ORAL | Status: DC
  Administered 2024-06-28 – 2024-06-30 (×3): 25 mg via ORAL
  Filled 2024-06-26 (×3): qty 1

## 2024-06-26 MED ORDER — HYDROMORPHONE HCL 1 MG/ML IJ SOLN
INTRAMUSCULAR | Status: AC
  Administered 2024-06-26: 1 mg via INTRAVENOUS
  Filled 2024-06-26: qty 1

## 2024-06-26 MED ORDER — HYDROMORPHONE HCL 1 MG/ML IJ SOLN
1.0000 mg | Freq: Once | INTRAMUSCULAR | Status: DC | PRN
  Administered 2024-06-26: 0.5 mg via INTRAVENOUS

## 2024-06-26 MED ORDER — FENTANYL CITRATE (PF) 100 MCG/2ML IJ SOLN
INTRAMUSCULAR | Status: AC
  Filled 2024-06-26: qty 2

## 2024-06-26 MED ORDER — HEPARIN (PORCINE) 25000 UT/250ML-% IV SOLN
INTRAVENOUS | Status: AC
  Filled 2024-06-26: qty 250

## 2024-06-26 MED ORDER — HYDRALAZINE HCL 20 MG/ML IJ SOLN
INTRAMUSCULAR | Status: AC
  Filled 2024-06-26: qty 1

## 2024-06-26 MED ORDER — HEPARIN (PORCINE) 25000 UT/250ML-% IV SOLN: 600.0000 [IU]/h | INTRAVENOUS | Status: DC

## 2024-06-26 MED ORDER — ATORVASTATIN CALCIUM 20 MG PO TABS
20.0000 mg | ORAL_TABLET | Freq: Every day | ORAL | Status: DC
  Administered 2024-06-27 – 2024-06-29 (×3): 20 mg via ORAL
  Filled 2024-06-26 (×4): qty 1

## 2024-06-26 MED ORDER — ONDANSETRON HCL 4 MG/2ML IJ SOLN
INTRAMUSCULAR | Status: AC
  Administered 2024-06-26: 4 mg via INTRAVENOUS
  Filled 2024-06-26: qty 2

## 2024-06-26 MED ORDER — TRAMADOL HCL 50 MG PO TABS
50.0000 mg | ORAL_TABLET | Freq: Two times a day (BID) | ORAL | Status: DC | PRN
  Administered 2024-06-26 – 2024-06-29 (×2): 50 mg via ORAL
  Filled 2024-06-26 (×2): qty 1

## 2024-06-26 MED ORDER — SODIUM CHLORIDE 0.9 % IV SOLN: INTRAVENOUS | Status: DC

## 2024-06-26 MED ORDER — LIDOCAINE-EPINEPHRINE (PF) 1 %-1:200000 IJ SOLN
INTRAMUSCULAR | Status: DC | PRN
  Administered 2024-06-26: 10 mL

## 2024-06-26 MED ORDER — HEPARIN (PORCINE) 25000 UT/250ML-% IV SOLN
600.0000 [IU]/h | INTRAVENOUS | Status: DC
  Administered 2024-06-26: 600 [IU]/h via INTRA_ARTERIAL

## 2024-06-26 MED ORDER — CEFAZOLIN SODIUM-DEXTROSE 2-4 GM/100ML-% IV SOLN
2.0000 g | INTRAVENOUS | Status: AC
  Administered 2024-06-26: 2 g via INTRAVENOUS

## 2024-06-26 MED ORDER — HEPARIN SODIUM (PORCINE) 1000 UNIT/ML IJ SOLN
INTRAMUSCULAR | Status: AC
  Filled 2024-06-26: qty 10

## 2024-06-26 MED ORDER — MAGNESIUM OXIDE -MG SUPPLEMENT 400 (240 MG) MG PO TABS: 400.0000 mg | ORAL_TABLET | Freq: Every day | ORAL | Status: DC | PRN

## 2024-06-26 MED ORDER — MIDAZOLAM HCL (PF) 2 MG/2ML IJ SOLN
INTRAMUSCULAR | Status: DC | PRN
  Administered 2024-06-26: .5 mg via INTRAVENOUS
  Administered 2024-06-26: 2 mg via INTRAVENOUS

## 2024-06-26 MED ORDER — SODIUM CHLORIDE 0.9 % IV SOLN: 250.0000 mL | INTRAVENOUS | Status: DC | PRN

## 2024-06-26 MED ORDER — HEPARIN SODIUM (PORCINE) 1000 UNIT/ML IJ SOLN
INTRAMUSCULAR | Status: DC | PRN
  Administered 2024-06-26: 4000 [IU] via INTRAVENOUS

## 2024-06-26 MED ORDER — CEFAZOLIN SODIUM-DEXTROSE 2-4 GM/100ML-% IV SOLN
INTRAVENOUS | Status: AC
  Filled 2024-06-26: qty 100

## 2024-06-26 MED ORDER — MIDAZOLAM HCL 2 MG/ML PO SYRP: 8.0000 mg | ORAL_SOLUTION | Freq: Once | ORAL | Status: DC | PRN

## 2024-06-26 MED ORDER — SODIUM CHLORIDE 0.9% FLUSH: 3.0000 mL | INTRAVENOUS | Status: DC | PRN

## 2024-06-26 SURGICAL SUPPLY — 18 items
BALLOON LUTONIX 018 4X80X130 (BALLOONS) IMPLANT
BALLOON LUTONIX 018 5X100X130 (BALLOONS) IMPLANT
BALLOON ULTRVS 018 2.5X150X150 (BALLOONS) IMPLANT
CATH ANGIO 5F PIGTAIL 65CM (CATHETERS) IMPLANT
CATH BEACON 5 .038 100 VERT TP (CATHETERS) IMPLANT
CATH INFUS 135X50 (CATHETERS) IMPLANT
CATH ROTAREX 135 6FR (CATHETERS) IMPLANT
COVER PROBE ULTRASOUND 5X96 (MISCELLANEOUS) IMPLANT
DEVICE PRESTO INFLATION (MISCELLANEOUS) IMPLANT
GLIDEWIRE ADV .035X260CM (WIRE) IMPLANT
KIT CV MULTILUMEN 7FR 20 (SET/KITS/TRAYS/PACK) IMPLANT
PACK ANGIOGRAPHY (CUSTOM PROCEDURE TRAY) ×1 IMPLANT
SHEATH ANL2 6FRX45 HC (SHEATH) IMPLANT
SHEATH BRITE TIP 5FRX11 (SHEATH) IMPLANT
SYR MEDRAD MARK 7 150ML (SYRINGE) IMPLANT
TUBING CONTRAST HIGH PRESS 72 (TUBING) IMPLANT
WIRE G V18X300CM (WIRE) IMPLANT
WIRE J 3MM .035X145CM (WIRE) IMPLANT

## 2024-06-26 NOTE — Progress Notes (Addendum)
 1600 Patient received from Vascular Lab post attempted treatment of patient. Patient alert and oriented. Left leg femoral pulse palaple but left popliteal, pedal and posterior tibial are absent. Left calf is cool to the touch. Heel is mottled and cold. Left toes are pale and cold. Right leg posterior popliteal.pedal and posterior tibial pulses doppler. Patient is somewhat comfortable at this time. 1630 Dr Marea notified that left leg pulses are still absent. Patient has no IV pain medication ordered. Requested a prn IV push. 1645 Patient states pain has reached an intolerable level. Given prn IV push. 1655 Patient states pain is much better. Sleeping at intervals. 1705 Patient asleep. 1828 Treated for blood pressure greater than 160s

## 2024-06-26 NOTE — Op Note (Signed)
 [facility]  Percutaneous Study/Intervention Procedural Note   Date of Surgery: 06/26/2024  Surgeon(s):Graham Doukas    Assistants:none  Pre-operative Diagnosis: PAD with rest pain left lower extremity, acute on chronic ischemia with thrombosis of previous intervention  Post-operative diagnosis:  Same  Procedure(s) Performed:             1.  Ultrasound guidance for vascular access right femoral artery             2.  Catheter placement into left peroneal artery from right femoral approach             3.  Aortogram and selective left lower extremity angiogram             4.  Mechanical thrombectomy of the left SFA and popliteal arteries with the Rota Rex device for thrombosis of previous stents             5.  Angioplasty of left external iliac artery with 5 mm diameter by 10 cm length Lutonix drug-coated angioplasty balloon  6.  Angioplasty of the left proximal SFA and common femoral artery with 4 mm diameter by 10 cm length Lutonix drug-coated balloon             7.  Angioplasty of the left tibioperoneal trunk and proximal peroneal artery with 2.5 mm diameter by 15 cm length angioplasty balloon  8.  Infusion for thrombolysis with a 135 cm total length 50 cm working length thrombolytic catheter from the left common femoral artery down to the left peroneal artery with instillation of 8 mg of tPA  9.  Ultrasound guidance for vascular access right femoral vein  10.  Placement of right femoral venous triple-lumen catheter  EBL: 100 cc  Contrast: 50 cc  Fluoro Time: 7.8 minutes  Moderate Conscious Sedation Time: approximately 50 minutes using 2.5 mg of Versed  and 75 mcg of Fentanyl               Indications:  Patient is a 78 y.o.female with recurrent rest pain of the left lower extremity with acute on chronic ischemic symptoms. The patient has noninvasive study showing thrombosis of her previous SFA/popliteal stents. The patient is brought in for angiography for  further evaluation and potential treatment.  Due to the limb threatening nature of the situation, angiogram was performed for attempted limb salvage. The patient is aware that if the procedure fails, amputation would be expected.  The patient also understands that even with successful revascularization, amputation may still be required due to the severity of the situation.  Risks and benefits are discussed and informed consent is obtained.   Procedure:  The patient was identified and appropriate procedural time out was performed.  The patient was then placed supine on the table and prepped and draped in the usual sterile fashion. Moderate conscious sedation was administered during a face to face encounter with the patient throughout the procedure with my supervision of the RN administering medicines and monitoring the patient's vital signs, pulse oximetry, telemetry and mental status throughout from the start of the procedure until the patient was taken to the recovery room. Ultrasound was used to evaluate the right common femoral artery.  It was patent .  A digital ultrasound image was acquired.  A Seldinger needle was used to access the right common femoral artery under direct ultrasound guidance and a permanent image was performed.  A 0.035 J wire was advanced without resistance and a 5Fr sheath was placed.  Pigtail catheter was placed into the aorta and an AP aortogram was performed. This demonstrated normal renal arteries.  The aorta did not have any significant stenosis but appeared mildly ectatic as compared to her extremely small iliac and infrainguinal vessels.  The right iliac stents were patent without significant recurrent stenosis.  The left common iliac artery was patent but the left external iliac artery had an area of stenosis of about 60% proximally and another area of stenosis of about 80% in the more distal left external iliac artery.  The left common femoral artery was very small. I then  crossed the aortic bifurcation and advanced to the left femoral head. Selective left lower extremity angiogram was then performed. This demonstrated a near flush occlusion of the left SFA at and above the previously placed stents which were thrombosed.  The profunda femoris artery was relatively small but not clearly diseased.  There was reconstitution of the mid popliteal artery which was very small.  The tibioperoneal trunk had a high-grade stenosis of greater than 80% but all 3 tibial vessels were present although extremely small.  The anterior tibial artery did not have any obvious stenosis.  The peroneal artery in the proximal to mid segment had about a 70% stenosis.  The posterior tibial artery did not have any obvious stenosis, but again all 3 tibial vessels were extremely small and flow to the foot was very sluggish. It was felt that it was in the patient's best interest to proceed with intervention after these images to avoid a second procedure and a larger amount of contrast and fluoroscopy based off of the findings from the initial angiogram. The patient was systemically heparinized and a 6 French Ansell sheath was then placed over the [employer] wire. I then used a Kumpe catheter and the advantage wire to easily navigate through the thrombotic occlusion in the left SFA and popliteal arteries to confirm intraluminal flow in the below-knee popliteal artery.  I then placed a V18 wire down the peroneal artery.  With the extensive thrombosis, mechanical thrombectomy was performed with 4 passes with the Rota Rex device throughout the entirety of the left SFA and the above-knee popliteal artery.  Large amounts of thrombus were removed, but there remained very poor flow and residual thrombosis present.  I then elected to treat both the inflow and the outflow in hopes of improving perfusion.  The peroneal artery and tibioperoneal trunk were addressed with a 2.5 mm diameter by 15 cm length angioplasty  balloon inflated to 8 atm for 1 minute.  The sheath was pulled back and the external iliac artery was treated with a 5 mm diameter by 10 cm length Lutonix drug-coated angioplasty balloon inflated up to 10 atm for 1 minute to encompass both lesions.  The common femoral artery and proximal SFA were then treated with a 4 mm diameter by 10 cm length Lutonix drug-coated angioplasty balloon inflated to 8 atm for 1 minute.  After performing these angioplasties, another pass with the Rota Rex device to perform mechanical thrombectomy throughout the left SFA and popliteal arteries were performed.  Completion imaging showed greater than 50% residual stenosis in the distal external iliac artery lesion over the proximal external iliac artery now only had about a 30% residual stenosis.  The SFA and popliteal stents remain thrombosed.  There was extremely sluggish flow distally and so that was difficult to evaluate the tibial intervention.  It was clear that we would need to run overnight thrombolytic therapy if  we wanted any hopes of restoring patency through these interventions.  A 135 cm total length 50 cm working length thrombolytic catheter was then placed over the V18 wire and the V18 wire was removed.  The distal tip was in the tibioperoneal trunk and the proximal aspect was in the common femoral artery.  This encompassed the entirety of the SFA and popliteal arteries.  8 mg of tPA were instilled through this catheter and then continuous thrombolytic therapy would be run overnight.  To avoid any ongoing lab draws and needle sticks while on thrombolytic therapy as well as to provide durable venous access, a central line was placed.  With the groin already prepped, the right femoral vein was visualized with ultrasound and found to be widely patent.  It was then accessed under direct ultrasound guidance without difficulty with a Seldinger needle.  A J-wire was placed.  After skin nick and dilatation a triple-lumen catheter  was placed over the wire and the wire was removed.  All 3 lines withdrew dark red nonpulsatile blood and flushed easily with heparinized saline.  It was then secured to the skin with 2 silk sutures.  I elected to terminate the procedure. The patient was taken to the recovery room in stable condition having tolerated the procedure well.  Findings:               Aortogram:  This demonstrated normal renal arteries.  The aorta did not have any significant stenosis but appeared mildly ectatic as compared to her extremely small iliac and infrainguinal vessels.  The right iliac stents were patent without significant recurrent stenosis.  The left common iliac artery was patent but the left external iliac artery had an area of stenosis of about 60% proximally and another area of stenosis of about 80% in the more distal left external iliac artery.  The left common femoral artery was very small             Left lower Extremity:  This demonstrated a near flush occlusion of the left SFA at and above the previously placed stents which were thrombosed.  The profunda femoris artery was relatively small but not clearly diseased.  There was reconstitution of the mid popliteal artery which was very small.  The tibioperoneal trunk had a high-grade stenosis of greater than 80% but all 3 tibial vessels were present although extremely small.  The anterior tibial artery did not have any obvious stenosis.  The peroneal artery in the proximal to mid segment had about a 70% stenosis.  The posterior tibial artery did not have any obvious stenosis, but again all 3 tibial vessels were extremely small and flow to the foot was very sluggish   Disposition: Patient was taken to the recovery room in stable condition having tolerated the procedure well.  Complications: None  Selinda Gu 06/26/2024 2:02 PM   This note was created with Dragon Medical transcription system. Any errors in dictation are purely unintentional.

## 2024-06-26 NOTE — Progress Notes (Signed)
 PHARMACY - ANTICOAGULATION CONSULT NOTE  Pharmacy Consult for catheter directed thrombolysis Indication: ischemic limb  Allergies[1]  Patient Measurements: Height: 5' 3 (160 cm) Weight: 53.1 kg (117 lb) IBW/kg (Calculated) : 52.4 HEPARIN  DW (KG): 53.1  Vital Signs: Temp: 97.2 F (36.2 C) (12/11 1408) Temp Source: Temporal (12/11 1408) BP: 166/69 (12/11 1430) Pulse Rate: 62 (12/11 1430)  Labs: Recent Labs    06/26/24 1229 06/26/24 1418  HGB  --  9.2*  HCT  --  29.4*  PLT  --  336  CREATININE 1.35*  --     Estimated Creatinine Clearance: 27.5 mL/min (A) (by C-G formula based on SCr of 1.35 mg/dL (H)).   Medical History: Past Medical History:  Diagnosis Date   Anxiety    Cholelithiasis 09/2017   Dysrhythmia 2019   skipping a beat with palpatations   GERD (gastroesophageal reflux disease)    food gets stuck like an esophagitis   Hypertension    Insomnia    Migraines    magnesium helps   NAFL (nonalcoholic fatty liver)    Restless leg syndrome    uses heating pad to resolve   Sciatica 2019   pt is in a good place after getting an SI joint injection    Medications:  Scheduled:   [START ON 06/27/2024] atenolol  100 mg Oral Daily   atorvastatin   20 mg Oral q1800   [START ON 06/27/2024] Calcium  Magnesium Zinc  1 tablet Oral Daily   [START ON 06/27/2024] chlorthalidone  25 mg Oral Daily   gabapentin  300 mg Oral Daily   irbesartan  150 mg Oral Daily   sodium chloride  flush  3 mL Intravenous Q12H    Assessment: 78 YOF w/ PMH of HTN, PVD, lumbosacral spondylosis, CKD, Raynaud's disease  with acute on chronic ischemia with thrombosis of previous intervention currently receiving catheter-directed thrombolysis with heparin  and alteplase.  Goal of Therapy:  fibrinogen > 150 mg/dL Monitor platelets by anticoagulation protocol: Yes   Plan:  --check fibrinogen q6h: (notify MD if < 150) --continue catheter-directed thrombolysis with heparin  and no titration  based on heparin  levels per instructions from Dr Marea  Adriana JONETTA Bolster 06/26/2024,2:43 PM      [1]  Allergies Allergen Reactions   Calcium  Channel Blockers Shortness Of Breath, Anxiety, Palpitations and Other (See Comments)    Cognitive effects also--difficulty thinking   Hydrochlorothiazide Shortness Of Breath   Sodium Metabisulfite Anaphylaxis    Sulfite--in foods   Sulfites Swelling    Can't breathe   Telmisartan-Hctz Shortness Of Breath    Chest pain & fatigue    Cardizem [Diltiazem Hcl] Other (See Comments)    Difficulty eating, sleeping, and concentrating Heart goes crazy    Sulfa Antibiotics Hives   Acyclovir And Related    Plavix  [Clopidogrel ]    Lexapro [Escitalopram Oxalate] Other (See Comments)    Gi upset

## 2024-06-26 NOTE — Interval H&P Note (Signed)
 History and Physical Interval Note:  06/26/2024 11:44 AM  Megan Irwin  has presented today for surgery, with the diagnosis of LLE Angio    Crititcal limb ischemia left extremity.  The various methods of treatment have been discussed with the patient and family. After consideration of risks, benefits and other options for treatment, the patient has consented to  Procedures: LOWER EXTREMITY INTERVENTION (Left) Lower Extremity Angiography (Left) as a surgical intervention.  The patient's history has been reviewed, patient examined, no change in status, stable for surgery.  I have reviewed the patient's chart and labs.  Questions were answered to the patient's satisfaction.     Clide Remmers

## 2024-06-27 ENCOUNTER — Inpatient Hospital Stay: Admitting: Anesthesiology

## 2024-06-27 ENCOUNTER — Ambulatory Visit (INDEPENDENT_AMBULATORY_CARE_PROVIDER_SITE_OTHER): Admitting: Vascular Surgery

## 2024-06-27 ENCOUNTER — Encounter (INDEPENDENT_AMBULATORY_CARE_PROVIDER_SITE_OTHER)

## 2024-06-27 ENCOUNTER — Encounter: Admission: AD | Disposition: A | Payer: Self-pay | Source: Home / Self Care | Attending: Vascular Surgery

## 2024-06-27 ENCOUNTER — Encounter: Payer: Self-pay | Admitting: Vascular Surgery

## 2024-06-27 DIAGNOSIS — Z9889 Other specified postprocedural states: Secondary | ICD-10-CM

## 2024-06-27 HISTORY — PX: LOWER EXTREMITY ANGIOGRAPHY: CATH118251

## 2024-06-27 LAB — COMPREHENSIVE METABOLIC PANEL WITH GFR
ALT: 40 U/L (ref 0–44)
AST: 79 U/L — ABNORMAL HIGH (ref 15–41)
Albumin: 4.2 g/dL (ref 3.5–5.0)
Alkaline Phosphatase: 222 U/L — ABNORMAL HIGH (ref 38–126)
Anion gap: 11 (ref 5–15)
BUN: 38 mg/dL — ABNORMAL HIGH (ref 8–23)
CO2: 25 mmol/L (ref 22–32)
Calcium: 8.9 mg/dL (ref 8.9–10.3)
Chloride: 104 mmol/L (ref 98–111)
Creatinine, Ser: 1.16 mg/dL — ABNORMAL HIGH (ref 0.44–1.00)
GFR, Estimated: 47 mL/min — ABNORMAL LOW (ref >60)
Glucose, Bld: 127 mg/dL — ABNORMAL HIGH (ref 70–99)
Potassium: 5.2 mmol/L — ABNORMAL HIGH (ref 3.5–5.1)
Sodium: 140 mmol/L (ref 135–145)
Total Bilirubin: 0.2 mg/dL (ref 0.0–1.2)
Total Protein: 7.1 g/dL (ref 6.5–8.1)

## 2024-06-27 LAB — HEPARIN LEVEL (UNFRACTIONATED)
Heparin Unfractionated: 0.52 [IU]/mL (ref 0.30–0.70)
Heparin Unfractionated: >1.1 [IU]/mL — ABNORMAL HIGH (ref 0.30–0.70)

## 2024-06-27 LAB — CBC
HCT: 35.4 % — ABNORMAL LOW (ref 36.0–46.0)
HCT: 34 % — ABNORMAL LOW (ref 36.0–46.0)
Hemoglobin: 10.8 g/dL — ABNORMAL LOW (ref 12.0–15.0)
Hemoglobin: 10.5 g/dL — ABNORMAL LOW (ref 12.0–15.0)
MCH: 28.5 pg (ref 26.0–34.0)
MCH: 28.6 pg (ref 26.0–34.0)
MCHC: 30.5 g/dL (ref 30.0–36.0)
MCHC: 30.9 g/dL (ref 30.0–36.0)
MCV: 93.4 fL (ref 80.0–100.0)
MCV: 92.6 fL (ref 80.0–100.0)
Platelets: 311 K/uL (ref 150–400)
Platelets: 321 K/uL (ref 150–400)
RBC: 3.79 MIL/uL — ABNORMAL LOW (ref 3.87–5.11)
RBC: 3.67 MIL/uL — ABNORMAL LOW (ref 3.87–5.11)
RDW: 14.4 % (ref 11.5–15.5)
RDW: 14.5 % (ref 11.5–15.5)
WBC: 23.1 K/uL — ABNORMAL HIGH (ref 4.0–10.5)
WBC: 20.7 K/uL — ABNORMAL HIGH (ref 4.0–10.5)
nRBC: 0 % (ref 0.0–0.2)
nRBC: 0 % (ref 0.0–0.2)

## 2024-06-27 LAB — ABO/RH: ABO/RH(D): A POS

## 2024-06-27 LAB — FIBRINOGEN: Fibrinogen: 343 mg/dL (ref 210–475)

## 2024-06-27 LAB — GLUCOSE, CAPILLARY: Glucose-Capillary: 119 mg/dL — ABNORMAL HIGH (ref 70–99)

## 2024-06-27 SURGERY — ENDARTERECTOMY, FEMORAL
Anesthesia: General | Laterality: Left

## 2024-06-27 SURGERY — LOWER EXTREMITY ANGIOGRAPHY
Anesthesia: Moderate Sedation | Laterality: Left

## 2024-06-27 MED ORDER — HEPARIN (PORCINE) 25000 UT/250ML-% IV SOLN
600.0000 [IU]/h | INTRAVENOUS | Status: DC
  Administered 2024-06-27: 600 [IU]/h via INTRAVENOUS

## 2024-06-27 MED ORDER — ONDANSETRON HCL 4 MG/2ML IJ SOLN
4.0000 mg | Freq: Once | INTRAMUSCULAR | Status: AC
  Administered 2024-06-27: 4 mg via INTRAVENOUS

## 2024-06-27 MED ORDER — LACTATED RINGERS IV SOLN
INTRAVENOUS | Status: DC
  Administered 2024-06-27: 125 mL/h via INTRAVENOUS

## 2024-06-27 MED ORDER — DEXAMETHASONE SOD PHOSPHATE PF 10 MG/ML IJ SOLN
INTRAMUSCULAR | Status: DC | PRN
  Administered 2024-06-27: 10 mg via INTRAVENOUS

## 2024-06-27 MED ORDER — CEFAZOLIN SODIUM-DEXTROSE 2-4 GM/100ML-% IV SOLN
INTRAVENOUS | Status: AC
  Filled 2024-06-27: qty 100

## 2024-06-27 MED ORDER — FENTANYL CITRATE (PF) 100 MCG/2ML IJ SOLN
INTRAMUSCULAR | Status: DC | PRN
  Administered 2024-06-27: 12.5 ug via INTRAVENOUS

## 2024-06-27 MED ORDER — FENTANYL CITRATE (PF) 100 MCG/2ML IJ SOLN
INTRAMUSCULAR | Status: AC
  Filled 2024-06-27: qty 2

## 2024-06-27 MED ORDER — GLYCOPYRROLATE 0.2 MG/ML IJ SOLN: INTRAMUSCULAR | Status: DC | PRN

## 2024-06-27 MED ORDER — GENTAMICIN SULFATE 40 MG/ML IJ SOLN
INTRAMUSCULAR | Status: AC
  Filled 2024-06-27: qty 2

## 2024-06-27 MED ORDER — FENTANYL CITRATE (PF) 100 MCG/2ML IJ SOLN
INTRAMUSCULAR | Status: DC | PRN
  Administered 2024-06-27: 25 ug via INTRAVENOUS

## 2024-06-27 MED ORDER — ONDANSETRON HCL 4 MG/2ML IJ SOLN: 4.0000 mg | Freq: Once | INTRAMUSCULAR | Status: DC | PRN

## 2024-06-27 MED ORDER — HEPARIN (PORCINE) IN NACL 1000-0.9 UT/500ML-% IV SOLN
INTRAVENOUS | Status: DC | PRN
  Administered 2024-06-27: 1000 mL

## 2024-06-27 MED ORDER — HEPARIN SODIUM (PORCINE) 5000 UNIT/ML IJ SOLN
INTRAMUSCULAR | Status: DC | PRN
  Administered 2024-06-27: 100 mL via SURGICAL_CAVITY

## 2024-06-27 MED ORDER — ROCURONIUM BROMIDE 100 MG/10ML IV SOLN
INTRAVENOUS | Status: DC | PRN
  Administered 2024-06-27: 20 mg via INTRAVENOUS
  Administered 2024-06-27 (×2): 10 mg via INTRAVENOUS
  Administered 2024-06-27: 30 mg via INTRAVENOUS
  Administered 2024-06-27: 10 mg via INTRAVENOUS

## 2024-06-27 MED ORDER — PROPOFOL 10 MG/ML IV BOLUS
INTRAVENOUS | Status: DC | PRN
  Administered 2024-06-27: 100 mg via INTRAVENOUS

## 2024-06-27 MED ORDER — HEPARIN SODIUM (PORCINE) 1000 UNIT/ML IJ SOLN
INTRAMUSCULAR | Status: AC
  Filled 2024-06-27: qty 10

## 2024-06-27 MED ORDER — FENTANYL CITRATE (PF) 100 MCG/2ML IJ SOLN: 25.0000 ug | INTRAMUSCULAR | Status: DC | PRN

## 2024-06-27 MED ORDER — OXYCODONE HCL 5 MG/5ML PO SOLN: 5.0000 mg | Freq: Once | ORAL | Status: DC | PRN

## 2024-06-27 MED ORDER — PROPOFOL 10 MG/ML IV BOLUS
INTRAVENOUS | Status: AC
  Filled 2024-06-27: qty 20

## 2024-06-27 MED ORDER — PHENYLEPHRINE HCL-NACL 20-0.9 MG/250ML-% IV SOLN
INTRAVENOUS | Status: DC | PRN
  Administered 2024-06-27: 25 ug/min via INTRAVENOUS

## 2024-06-27 MED ORDER — LIDOCAINE HCL (PF) 2 % IJ SOLN
INTRAMUSCULAR | Status: AC
  Filled 2024-06-27: qty 5

## 2024-06-27 MED ORDER — FENTANYL CITRATE (PF) 100 MCG/2ML IJ SOLN
INTRAMUSCULAR | Status: DC | PRN
  Administered 2024-06-27 (×4): 25 ug via INTRAVENOUS

## 2024-06-27 MED ORDER — PHENYLEPHRINE 80 MCG/ML (10ML) SYRINGE FOR IV PUSH (FOR BLOOD PRESSURE SUPPORT)
PREFILLED_SYRINGE | INTRAVENOUS | Status: DC | PRN
  Administered 2024-06-27: 160 ug via INTRAVENOUS

## 2024-06-27 MED ORDER — HEPARIN SODIUM (PORCINE) 1000 UNIT/ML IJ SOLN
INTRAMUSCULAR | Status: DC | PRN
  Administered 2024-06-27: 2500 [IU] via INTRAVENOUS

## 2024-06-27 MED ORDER — LIDOCAINE-EPINEPHRINE (PF) 1 %-1:200000 IJ SOLN
INTRAMUSCULAR | Status: DC | PRN
  Administered 2024-06-27: 10 mL

## 2024-06-27 MED ORDER — MIDAZOLAM HCL (PF) 2 MG/2ML IJ SOLN
INTRAMUSCULAR | Status: DC | PRN
  Administered 2024-06-27: 1 mg via INTRAVENOUS

## 2024-06-27 MED ORDER — EPHEDRINE SULFATE-NACL 50-0.9 MG/10ML-% IV SOSY
PREFILLED_SYRINGE | INTRAVENOUS | Status: DC | PRN
  Administered 2024-06-27 (×3): 5 mg via INTRAVENOUS
  Administered 2024-06-27: 10 mg via INTRAVENOUS

## 2024-06-27 MED ORDER — ACETAMINOPHEN 10 MG/ML IV SOLN
INTRAVENOUS | Status: AC
  Filled 2024-06-27: qty 100

## 2024-06-27 MED ORDER — ACETAMINOPHEN 10 MG/ML IV SOLN: 1000.0000 mg | Freq: Once | INTRAVENOUS | Status: DC | PRN

## 2024-06-27 MED ORDER — SUGAMMADEX SODIUM 200 MG/2ML IV SOLN
INTRAVENOUS | Status: DC | PRN
  Administered 2024-06-27: 200 mg via INTRAVENOUS

## 2024-06-27 MED ORDER — LIDOCAINE HCL (CARDIAC) PF 100 MG/5ML IV SOSY
PREFILLED_SYRINGE | INTRAVENOUS | Status: DC | PRN
  Administered 2024-06-27: 60 mg via INTRAVENOUS

## 2024-06-27 MED ORDER — HEPARIN SODIUM (PORCINE) 5000 UNIT/ML IJ SOLN
INTRAMUSCULAR | Status: AC
  Filled 2024-06-27: qty 1

## 2024-06-27 MED ORDER — EPHEDRINE 5 MG/ML INJ
INTRAVENOUS | Status: AC
  Filled 2024-06-27: qty 5

## 2024-06-27 MED ORDER — LACTATED RINGERS IV SOLN: INTRAVENOUS | Status: DC | PRN

## 2024-06-27 MED ORDER — PHENYLEPHRINE HCL-NACL 20-0.9 MG/250ML-% IV SOLN
INTRAVENOUS | Status: AC
  Filled 2024-06-27: qty 250

## 2024-06-27 MED ORDER — HEPARIN (PORCINE) 25000 UT/250ML-% IV SOLN
INTRAVENOUS | Status: AC
  Filled 2024-06-27: qty 250

## 2024-06-27 MED ORDER — CEFAZOLIN SODIUM-DEXTROSE 2-3 GM-%(50ML) IV SOLR
INTRAVENOUS | Status: DC | PRN
  Administered 2024-06-27: 2 g via INTRAVENOUS

## 2024-06-27 MED ORDER — SODIUM CHLORIDE 0.9% FLUSH: 10.0000 mL | INTRAVENOUS | Status: DC | PRN

## 2024-06-27 MED ORDER — HEMOSTATIC AGENTS (NO CHARGE) OPTIME
TOPICAL | Status: DC | PRN
  Administered 2024-06-27: 2 via TOPICAL

## 2024-06-27 MED ORDER — ONDANSETRON HCL 4 MG/2ML IJ SOLN
INTRAMUSCULAR | Status: DC | PRN
  Administered 2024-06-27: 4 mg via INTRAVENOUS

## 2024-06-27 MED ORDER — OXYCODONE HCL 5 MG PO TABS: 5.0000 mg | ORAL_TABLET | Freq: Once | ORAL | Status: DC | PRN

## 2024-06-27 MED ORDER — ONDANSETRON HCL 4 MG/2ML IJ SOLN
INTRAMUSCULAR | Status: AC
  Filled 2024-06-27: qty 2

## 2024-06-27 MED ORDER — HYDROMORPHONE HCL 1 MG/ML IJ SOLN
0.5000 mg | INTRAMUSCULAR | Status: DC | PRN
  Administered 2024-06-27: 0.5 mg via INTRAVENOUS

## 2024-06-27 MED ORDER — CEFAZOLIN SODIUM-DEXTROSE 2-4 GM/100ML-% IV SOLN
2.0000 g | Freq: Once | INTRAVENOUS | Status: AC
  Administered 2024-06-27: 2 g via INTRAVENOUS

## 2024-06-27 MED ORDER — CHLORHEXIDINE GLUCONATE CLOTH 2 % EX PADS
6.0000 | MEDICATED_PAD | Freq: Every day | CUTANEOUS | Status: DC
  Administered 2024-06-27 – 2024-06-30 (×4): 6 via TOPICAL

## 2024-06-27 MED ORDER — VANCOMYCIN HCL 1 G IV SOLR
INTRAVENOUS | Status: DC | PRN
  Administered 2024-06-27: 1000 mg

## 2024-06-27 MED ORDER — GENTAMICIN SULFATE 40 MG/ML IJ SOLN
INTRAMUSCULAR | Status: DC | PRN
  Administered 2024-06-27: 80 mg

## 2024-06-27 MED ORDER — SODIUM CHLORIDE 0.9% FLUSH
10.0000 mL | Freq: Two times a day (BID) | INTRAVENOUS | Status: DC
  Administered 2024-06-27 – 2024-06-30 (×6): 10 mL

## 2024-06-27 MED ADMIN — Iodixanol Inj 320 MG/ML (Iodine Equivalent): 40 mL | NDC 00407222316

## 2024-06-27 MED ADMIN — VASHE WOUND IRRIGATION OPTIME: 34 [oz_av] | TOPICAL | NDC 99999080177

## 2024-06-27 MED FILL — Vancomycin HCl For IV Soln 1 GM (Base Equivalent): INTRAVENOUS | Qty: 20 | Status: AC

## 2024-06-27 MED FILL — Heparin Sodium (Porcine) Inj 1000 Unit/ML: INTRAMUSCULAR | Qty: 10 | Status: AC

## 2024-06-27 MED FILL — Cefazolin Sodium-Dextrose IV Solution 2 GM/100ML-4%: INTRAVENOUS | Qty: 100 | Status: AC

## 2024-06-27 MED FILL — Midazolam HCl Inj 5 MG/5ML (Base Equivalent): INTRAMUSCULAR | Qty: 5 | Status: AC

## 2024-06-27 SURGICAL SUPPLY — 58 items
BAG DECANTER FOR FLEXI CONT (MISCELLANEOUS) ×1 IMPLANT
BAG ISOLATATION DRAPE 20X20 ST (DRAPES) IMPLANT
BLADE SURG 15 STRL LF DISP TIS (BLADE) ×1 IMPLANT
BLADE SURG SZ11 CARB STEEL (BLADE) ×1 IMPLANT
BRUSH SCRUB EZ 4% CHG (MISCELLANEOUS) ×1 IMPLANT
CATH EMB LF 3FRX80 (CATHETERS) IMPLANT
CHLORAPREP W/TINT 26 (MISCELLANEOUS) ×1 IMPLANT
CLAMP SUTURE YELLOW 5 PAIRS (MISCELLANEOUS) ×1 IMPLANT
CLEANSER WND VASHE 34 (WOUND CARE) ×1 IMPLANT
CLIP APPLIE 11 MED OPEN (CLIP) IMPLANT
CLIP APPLIE 9.375 SM OPEN (CLIP) IMPLANT
COVER PROBE FLX POLY STRL (MISCELLANEOUS) IMPLANT
DRAPE INCISE IOBAN 66X45 STRL (DRAPES) ×1 IMPLANT
DRAPE SHEET LG 3/4 BI-LAMINATE (DRAPES) ×1 IMPLANT
DRESSING SURGICEL FIBRLLR 1X2 (HEMOSTASIS) ×1 IMPLANT
DRSG OPSITE POSTOP 4X6 (GAUZE/BANDAGES/DRESSINGS) IMPLANT
ELECT CAUTERY BLADE 6.4 (BLADE) IMPLANT
ELECTRODE REM PT RTRN 9FT ADLT (ELECTROSURGICAL) ×1 IMPLANT
GAUZE 4X4 16PLY ~~LOC~~+RFID DBL (SPONGE) IMPLANT
GLOVE BIO SURGEON STRL SZ7 (GLOVE) ×3 IMPLANT
GLOVE SURG SYN 8.0 PF PI (GLOVE) ×3 IMPLANT
GOWN STRL REUS W/ TWL LRG LVL3 (GOWN DISPOSABLE) ×2 IMPLANT
GOWN STRL REUS W/ TWL XL LVL3 (GOWN DISPOSABLE) ×1 IMPLANT
GOWN STRL REUS W/TWL 2XL LVL3 (GOWN DISPOSABLE) ×1 IMPLANT
GRAFT VASC PATCH XENOSURE 1X14 (Vascular Products) IMPLANT
HEMOSTAT HEMOBLAST BELLOWS (HEMOSTASIS) IMPLANT
IV 0.9% NACL 500 ML (IV SOLUTION) ×1 IMPLANT
IV CONNECTOR ONE LINK NDLESS (IV SETS) IMPLANT
KIT CV MULTILUMEN 7FR 20 (SET/KITS/TRAYS/PACK) IMPLANT
KIT STIMULAN RAPID CURE 5CC (Orthopedic Implant) IMPLANT
KIT TURNOVER KIT A (KITS) ×1 IMPLANT
LABEL OR SOLS (LABEL) ×1 IMPLANT
LOOP VESSEL MAXI 1X406 RED (MISCELLANEOUS) ×2 IMPLANT
LOOP VESSEL MINI 0.8X406 BLUE (MISCELLANEOUS) ×2 IMPLANT
MANIFOLD NEPTUNE II (INSTRUMENTS) ×1 IMPLANT
NDL SAFETY ECLIP 18X1.5 (MISCELLANEOUS) ×1 IMPLANT
NS IRRIG 500ML POUR BTL (IV SOLUTION) ×1 IMPLANT
PACK BASIN MAJOR ARMC (MISCELLANEOUS) ×1 IMPLANT
PACK UNIVERSAL (MISCELLANEOUS) ×1 IMPLANT
PENCIL SMOKE EVACUATOR (MISCELLANEOUS) IMPLANT
RETRACTOR TRAXI PANNICULUS (MISCELLANEOUS) IMPLANT
SET WALTER ACTIVATION W/DRAPE (SET/KITS/TRAYS/PACK) ×1 IMPLANT
SOLN STERILE WATER 500 ML (IV SOLUTION) ×1 IMPLANT
SPONGE T-LAP 18X18 ~~LOC~~+RFID (SPONGE) IMPLANT
STAPLER SKIN PROX 35W (STAPLE) ×1 IMPLANT
SUT PROLENE 5 0 RB 1 DA (SUTURE) ×2 IMPLANT
SUT PROLENE 6 0 BV (SUTURE) ×4 IMPLANT
SUT PROLENE 7 0 BV 1 (SUTURE) ×2 IMPLANT
SUT SILK 2-0 18XBRD TIE 12 (SUTURE) ×1 IMPLANT
SUT SILK 3-0 18XBRD TIE 12 (SUTURE) ×1 IMPLANT
SUT SILK 4-0 18XBRD TIE 12 (SUTURE) ×1 IMPLANT
SUT VIC AB 2-0 CT1 TAPERPNT 27 (SUTURE) ×2 IMPLANT
SUT VIC AB 3-0 SH 27X BRD (SUTURE) ×1 IMPLANT
SUT VICRYL+ 3-0 36IN CT-1 (SUTURE) ×2 IMPLANT
SUTURE EHLN 3-0 FS-10 30 BLK (SUTURE) ×1 IMPLANT
SYR 5ML LL (SYRINGE) ×1 IMPLANT
TRAP FLUID SMOKE EVACUATOR (MISCELLANEOUS) ×1 IMPLANT
TRAY FOLEY MTR SLVR 16FR STAT (SET/KITS/TRAYS/PACK) ×1 IMPLANT

## 2024-06-27 SURGICAL SUPPLY — 10 items
BALLOON LUTONIX 5X120X130 (BALLOONS) IMPLANT
BALLOON ULTRVRSE 2X300X150 OTW (BALLOONS) IMPLANT
BALLOON ULTRVRSE 3X150X150 (BALLOONS) IMPLANT
CATH BEACON 5 .038 100 VERT TP (CATHETERS) IMPLANT
DEVICE PRESTO INFLATION (MISCELLANEOUS) IMPLANT
DEVICE STARCLOSE SE CLOSURE (Vascular Products) IMPLANT
PACK ANGIOGRAPHY (CUSTOM PROCEDURE TRAY) ×1 IMPLANT
STENT VIABAHN 6X100X120 (Permanent Stent) IMPLANT
WIRE G V18X300CM (WIRE) IMPLANT
WIRE J 3MM .035X145CM (WIRE) IMPLANT

## 2024-06-27 NOTE — Interval H&P Note (Signed)
 History and Physical Interval Note:  06/27/2024 8:35 AM  Megan Irwin Ka  has presented today for surgery, with the diagnosis of ischemic leg.  The various methods of treatment have been discussed with the patient and family. After consideration of risks, benefits and other options for treatment, the patient has consented to  Procedures: Lower Extremity Angiography (Left) as a surgical intervention.  The patient's history has been reviewed, patient examined, no change in status, stable for surgery.  I have reviewed the patient's chart and labs.  Questions were answered to the patient's satisfaction.     [name]

## 2024-06-27 NOTE — Op Note (Signed)
 [facility]  Percutaneous Study/Intervention Procedural Note   Date of Surgery: 06/27/2024  Surgeon(s):Maisey Deandrade    Assistants:none  Pre-operative Diagnosis: PAD with rest pain left lower extremity, status post overnight thrombolytic therapy  Post-operative diagnosis:  Same  Procedure(s) Performed:             1.  Angiogram left lower extremity             2.  Angioplasty of the left posterior tibial artery with 2 mm diameter by 30 cm length angioplasty balloon             3.  Angioplasty of the left popliteal artery with 3 mm diameter by 15 cm length angioplasty balloon             4.  Stent placement to the left external iliac artery with 6 mm diameter by 10 cm length Viabahn stent             5.  StarClose closure device right femoral artery  EBL: 5 cc  Contrast: 40 cc  Fluoro Time: 3.4 minutes  Moderate Conscious Sedation Time: approximately 34 minutes using 1 mg of Versed  and 37.5 mcg of Fentanyl               Indications:  Patient is a 78 y.o.female with an ischemic left lower extremity with rest pain.  She has run overnight thrombolytic therapy and is brought back for second look angiography. The patient is brought in for angiography for further evaluation and potential treatment.  Due to the limb threatening nature of the situation, angiogram was performed for attempted limb salvage. The patient is aware that if the procedure fails, amputation would be expected.  The patient also understands that even with successful revascularization, amputation may still be required due to the severity of the situation.  Risks and benefits are discussed and informed consent is obtained.   Procedure:  The patient was identified and appropriate procedural time out was performed.  The patient was then placed supine on the table and prepped and draped in the usual sterile fashion. Moderate conscious sedation was administered during a face to face encounter with the patient  throughout the procedure with my supervision of the RN administering medicines and monitoring the patient's vital signs, pulse oximetry, telemetry and mental status throughout from the start of the procedure until the patient was taken to the recovery room.  The existing thrombolytic catheter was removed after placing a V18 wire down into the posterior tibial artery.  Selective left lower extremity angiogram was then performed. This demonstrated continued flow limitation from the iliac artery with relatively high-grade residual stenosis.  The common femoral artery and the origin of the SFA were also significantly stenotic.  This was clearly flow-limiting and would require femoral endarterectomy.  The SFA and proximal popliteal artery stents were patent but there was significant narrowing in the popliteal artery below the stent in the 70 to 80% range.  The tibioperoneal trunk and posterior tibial artery had some residual thrombus and stenosis creating greater than 70% narrowing.  There was also some narrowing in the peroneal artery.  The anterior tibial artery did not appear to be continuous to the foot.  Whether this was spasm or thrombosis or distal disease it was very difficult to discern.  Her vessels were extremely small. It was felt that it was in the patient's best interest to proceed with intervention after these images to avoid a second procedure and a larger  amount of contrast and fluoroscopy based off of the findings from the initial angiogram. The patient was systemically heparinized and I proceeded with intervention to the left tibial, popliteal, and iliac arteries and would plan a femoral endarterectomy later today.  The posterior tibial artery was treated with a 2 mm diameter by 30 cm length angioplasty balloon inflated to 12 atm for 1 minute.  The popliteal artery was treated with a 3 mm diameter by 15 cm length angioplasty balloon inflated to 10 atm for 1 minute.  Completion imaging following these  treatments showed marked improvement.  The popliteal artery appeared to have less than 20% residual stenosis.  The posterior tibial artery not appear to be continuous into the foot although it remained extremely small.  The sheath was pulled back and the left external iliac artery was treated with a 6 mm diameter by 10 cm length Viabahn stent from the top of the femoral head to just below the origin of the internal iliac artery.  This was postdilated with a 5 mm balloon with excellent angiographic completion result and less than 10% residual stenosis in the stented area.  The common femoral artery in the proximal superficial femoral artery would be addressed with a femoral endarterectomy and I will plan that later today.  I elected to terminate the procedure. The sheath was removed and StarClose closure device was deployed in the right femoral artery with excellent hemostatic result. The patient was taken to the recovery room in stable condition having tolerated the procedure well.  Findings:                          Left Lower Extremity:  This demonstrated continued flow limitation from the iliac artery with relatively high-grade residual stenosis.  The common femoral artery and the origin of the SFA were also significantly stenotic.  This was clearly flow-limiting and would require femoral endarterectomy.  The SFA and proximal popliteal artery stents were patent but there was significant narrowing in the popliteal artery below the stent in the 70 to 80% range.  The tibioperoneal trunk and posterior tibial artery had some residual thrombus and stenosis creating greater than 70% narrowing.  There was also some narrowing in the peroneal artery.  The anterior tibial artery did not appear to be continuous to the foot.  Whether this was spasm or thrombosis or distal disease it was very difficult to discern.  Her vessels were extremely small.   Disposition: Patient was taken to the recovery room in stable  condition having tolerated the procedure well.  Complications: None  Selinda Gu 06/27/2024 9:28 AM   This note was created with Dragon Medical transcription system. Any errors in dictation are purely unintentional.

## 2024-06-27 NOTE — Plan of Care (Signed)
  Problem: Clinical Measurements: Goal: Diagnostic test results will improve Outcome: Progressing   Problem: Activity: Goal: Risk for activity intolerance will decrease Outcome: Progressing   Problem: Coping: Goal: Level of anxiety will decrease Outcome: Progressing   

## 2024-06-27 NOTE — Op Note (Signed)
 OPERATIVE NOTE   PROCEDURE: 1.   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  significant plaque in left common femoral and superficial femoral arteries  SPECIMEN(S):  Left common femoral and superficial femoral artery plaque.  INDICATIONS:    Patient presents with extensive peripheral arterial disease.  She has undergone revascularization of her iliac arteries as well as her infrainguinal circulation, but she has severe disease in her common femoral artery and the proximal portion of her superficial femoral artery just above the previously placed stents that we will be best addressed with femoral endarterectomy.  Left femoral endarterectomy is planned to try to improve perfusion.  The risks and benefits as well as alternative therapies including intervention were reviewed in detail all questions were answered the patient agrees to proceed with surgery.  DESCRIPTION: After obtaining full informed written consent, the patient was brought back to the operating room and placed supine upon the operating table.  The patient received IV antibiotics prior to induction.  After obtaining adequate anesthesia, the patient was prepped and draped in the standard fashion appropriate time out is called.    Vertical incision was created overlying the left femoral arteries. The common femoral artery proximally, and superficial femoral artery, and primary profunda femoris artery branches were encircled with vessel loops and prepared for control. The left femoral arteries were found to have significant plaque from the common femoral artery into the profunda and superficial femoral arteries.   2500 units of heparin  was given and allowed  circulate for 5 minutes.   Attention is then turned to the left femoral artery.  An arteriotomy is made with 11 blade and extended with Potts scissors in the common femoral artery and carried down onto the first 2-3 cm of the superficial femoral artery.  This was taken down just to the top of the previously placed Viabahn stent and proximally the arteriotomy went up almost to the distal endpoint of the external iliac artery stent that was at the top of the femoral head by fluoroscopy an endarterectomy was then performed. The [facility] was used to create a plane. The proximal endpoint was created with gentle traction, and we can see the bottom of the stent and we were cleaned up to that point. This was in the proximal common femoral artery. An eversion endarterectomy was then performed for the first cm of the profunda femoris artery. Good backbleeding was then seen. The distal endpoint of the superficial femoral endarterectomy was created with gentle traction and the distal endpoint was down to the top of the previously placed SFA stent.  I then tacked the origin of the profunda femoris artery with a total of three 7-0 Prolene tacking sutures.  The distal endpoint in the proximal SFA was tacked down including the previously placed Viabahn stent with two 7-0 Prolene tacking sutures.  The bovine pericardial patcth is then selected and prepared for a patch angioplasty.  It is cut and beveled and started at the proximal endpoint with a 6-0 Prolene suture.  Approximately one half of the suture line is run medially and laterally and the distal end point was cut and bevelled to match the arteriotomy.  A second 6-0 Prolene was started at the distal end point and run to the mid  portion to complete the arteriotomy.  The vessel was flushed prior to release of control and completion of the anastomosis.  At this point, flow was established first to the profunda femoris artery and then to the superficial femoral artery.  Easily palpable pulses are noted well beyond the anastomosis and both arteries.  The wound was then copiously irrigated with 1 L of Vashe irrigation.  Vancomycin and gentamicin impregnated antibiotic beads were then placed into the wound.  l used fibrillar and hemoblast topical hemostatic agents were placed in the femoral incision and hemostasis was complete. The femoral incision was then closed in a layered fashion with 2 layers of 2-0 Vicryl, 2 layers of 3-0 Vicryl, and staples for the skin closure. Sterile dressing were then placed over the incision.  The patient was then awakened from anesthesia and taken to the recovery room in stable condition having tolerated the procedure well.  COMPLICATIONS: None  CONDITION: Stable     Selinda Gu 06/27/2024 2:31 PM   This note was created with Dragon Medical transcription system. Any errors in dictation are purely unintentional.

## 2024-06-27 NOTE — Anesthesia Preprocedure Evaluation (Addendum)
 Anesthesia Evaluation  Patient identified by MRN, date of birth, ID band Patient awake    Reviewed: Allergy & Precautions, NPO status , Patient's Chart, lab work & pertinent test results  History of Anesthesia Complications Negative for: history of anesthetic complications  Airway Mallampati: IV   Neck ROM: Full    Dental  (+) Upper Dentures, Lower Dentures   Pulmonary former smoker (quit 2021)   Pulmonary exam normal breath sounds clear to auscultation       Cardiovascular hypertension, + Peripheral Vascular Disease (on Eliquis )  Normal cardiovascular exam Rhythm:Regular Rate:Normal  Echo 08/25/19:  MILDLY ABNORMAL STRESS ECHOCARDIOGRAM   ABNORMAL RESTING STUDY, NO CHANGE WITH STRESS  NORMAL RIGHT VENTRICULAR SYSTOLIC FUNCTION  MODERATE MITRAL VALVE INSUFFICIENCY  MILD-TO-MODERATE TRICUSPID VALVE INSUFFICIENCY  MILD AORTIC VALVE INSUFFICIENCY  NO VALVULAR STENOSIS  MILD MITRAL VALVE PROLAPSE  BASAL INFERIOR WALL MOTION ABNORMALITY     Neuro/Psych  Headaches PSYCHIATRIC DISORDERS Anxiety Depression       GI/Hepatic negative GI ROS,,,  Endo/Other  negative endocrine ROS    Renal/GU negative Renal ROS     Musculoskeletal   Abdominal   Peds  Hematology negative hematology ROS (+)   Anesthesia Other Findings   Reproductive/Obstetrics                              Anesthesia Physical Anesthesia Plan  ASA: 3  Anesthesia Plan: General   Post-op Pain Management:    Induction: Intravenous  PONV Risk Score and Plan: 3 and Ondansetron , Dexamethasone  and Treatment may vary due to age or medical condition  Airway Management Planned: Oral ETT  Additional Equipment:   Intra-op Plan:   Post-operative Plan: Extubation in OR  Informed Consent: I have reviewed the patients History and Physical, chart, labs and discussed the procedure including the risks, benefits and alternatives for the  proposed anesthesia with the patient or authorized representative who has indicated his/her understanding and acceptance.     Dental advisory given and Consent reviewed with POA  Plan Discussed with: CRNA  Anesthesia Plan Comments: (Patient's daughter consented, as well, due to patient sedation earlier today. Patient and daughter consented for risks of anesthesia including but not limited to:  - adverse reactions to medications - damage to eyes, teeth, lips or other oral mucosa - nerve damage due to positioning  - sore throat or hoarseness - damage to heart, brain, nerves, lungs, other parts of body or loss of life  Informed patient and daughter about role of CRNA in peri- and intra-operative care; they voiced understanding.)         Anesthesia Quick Evaluation

## 2024-06-27 NOTE — Anesthesia Postprocedure Evaluation (Signed)
 Anesthesia Post Note  Patient: Megan Irwin  Procedure(s) Performed: Left common femoral and superficial femoral artery endarterectomies and patch angioplasty (Left) APPLICATION OF CELL SAVER (Left)  Patient location during evaluation: PACU Anesthesia Type: General Level of consciousness: awake and alert, oriented and patient cooperative Pain management: pain level controlled Vital Signs Assessment: post-procedure vital signs reviewed and stable Respiratory status: spontaneous breathing, nonlabored ventilation and respiratory function stable Cardiovascular status: blood pressure returned to baseline and stable Postop Assessment: adequate PO intake Anesthetic complications: no   No notable events documented.   Last Vitals:  Vitals:   06/27/24 1445 06/27/24 1500  BP: (!) 157/60 (!) 139/56  Pulse: 74 74  Resp: 18 (!) 21  Temp: 36.8 C   SpO2: 98% 94%    Last Pain:  Vitals:   06/27/24 1500  TempSrc:   PainSc: 0-No pain                 Alfonso Ruths

## 2024-06-27 NOTE — Transfer of Care (Signed)
 Immediate Anesthesia Transfer of Care Note  Patient: Megan Irwin  Procedure(s) Performed: ENDARTERECTOMY, FEMORAL (Left) APPLICATION OF CELL SAVER (Left) EMBOLECTOMY  Patient Location: PACU  Anesthesia Type:General  Level of Consciousness: awake  Airway & Oxygen Therapy: Patient Spontanous Breathing and Patient connected to face mask oxygen  Post-op Assessment: Report given to RN  Post vital signs: Reviewed and stable  Last Vitals:  Vitals Value Taken Time  BP 152/63 06/27/24 14:35  Temp    Pulse 82 06/27/24 14:37  Resp 12 06/27/24 14:37  SpO2 99 % 06/27/24 14:37  Vitals shown include unfiled device data.  Last Pain:  Vitals:   06/27/24 1102  TempSrc:   PainSc: 0-No pain      Patients Stated Pain Goal: 2 (06/26/24 2031)  Complications: No notable events documented.

## 2024-06-27 NOTE — Anesthesia Procedure Notes (Signed)
 Procedure Name: Intubation Date/Time: 06/27/2024 12:13 PM  Performed by: Veronica Alm BROCKS, CRNAPre-anesthesia Checklist: Patient identified, Patient being monitored, Timeout performed, Emergency Drugs available and Suction available Patient Re-evaluated:Patient Re-evaluated prior to induction Oxygen Delivery Method: Circle system utilized Preoxygenation: Pre-oxygenation with 100% oxygen Induction Type: IV induction Ventilation: Mask ventilation without difficulty Laryngoscope Size: 3 and McGrath Grade View: Grade I Tube type: Oral Tube size: 6.5 mm Number of attempts: 1 Airway Equipment and Method: Stylet Placement Confirmation: ETT inserted through vocal cords under direct vision, positive ETCO2 and breath sounds checked- equal and bilateral Secured at: 20 cm Tube secured with: Tape Dental Injury: Teeth and Oropharynx as per pre-operative assessment

## 2024-06-27 NOTE — Interval H&P Note (Signed)
 History and Physical Interval Note:  06/27/2024 11:06 AM  Megan Irwin  has presented today for surgery, with the diagnosis of critical limb ischemia left lower extremity.  The various methods of treatment have been discussed with the patient and family. After consideration of risks, benefits and other options for treatment, the patient has consented to  Procedures: ENDARTERECTOMY, FEMORAL (Left) APPLICATION OF CELL SAVER (Left) as a surgical intervention.  The patient's history has been reviewed, patient examined, no change in status, stable for surgery.  I have reviewed the patient's chart and labs.  Questions were answered to the patient's satisfaction.    Also see operative note today.  Iliac arteries and infrainguinal arteries were successfully treated with intervention, but significant disease in the left common femoral and proximal superficial femoral artery require femoral endarterectomy.  Discussed with patient and daughter and they are agreeable to proceed.   [name]

## 2024-06-27 NOTE — Progress Notes (Addendum)
 9182 To Vascular Lab for procedure. 1030 Back from Vascular Lab- Dr. Marea unable to proceed with procedure. 1045 Pre Op foley placed per order. 1115 Patient sent to the Pre-Op area. 1615 Received from PACU. Left foot warm to touch and pink in color.Bilateral pedal and posterior tibial pulses doppler ed.

## 2024-06-28 LAB — CBC
HCT: 27.9 % — ABNORMAL LOW (ref 36.0–46.0)
Hemoglobin: 8.9 g/dL — ABNORMAL LOW (ref 12.0–15.0)
MCH: 28.9 pg (ref 26.0–34.0)
MCHC: 31.9 g/dL (ref 30.0–36.0)
MCV: 90.6 fL (ref 80.0–100.0)
Platelets: 277 K/uL (ref 150–400)
RBC: 3.08 MIL/uL — ABNORMAL LOW (ref 3.87–5.11)
RDW: 14.5 % (ref 11.5–15.5)
WBC: 20.2 K/uL — ABNORMAL HIGH (ref 4.0–10.5)
nRBC: 0 % (ref 0.0–0.2)

## 2024-06-28 MED ORDER — TRAZODONE HCL 50 MG PO TABS: 50.0000 mg | ORAL_TABLET | Freq: Every evening | ORAL | Status: DC | PRN

## 2024-06-28 MED ORDER — APIXABAN 5 MG PO TABS
5.0000 mg | ORAL_TABLET | Freq: Two times a day (BID) | ORAL | Status: DC
  Administered 2024-06-28 – 2024-06-30 (×5): 5 mg via ORAL
  Filled 2024-06-28 (×5): qty 1

## 2024-06-28 MED ORDER — TIZANIDINE HCL 4 MG PO TABS
2.0000 mg | ORAL_TABLET | Freq: Every day | ORAL | Status: DC
  Administered 2024-06-28 – 2024-06-29 (×2): 2 mg via ORAL
  Filled 2024-06-28 (×2): qty 1

## 2024-06-28 NOTE — Progress Notes (Signed)
 1 Day Post-Op   Subjective/Chief Complaint: Denies leg/foot pain. Mild LEFT groin discomfort at incision.    Objective: Vital signs in last 24 hours: Temp:  [97.8 F (36.6 C)-98.2 F (36.8 C)] 97.8 F (36.6 C) (12/13 0400) Pulse Rate:  [67-86] 80 (12/13 0900) Resp:  [13-26] 16 (12/13 0900) BP: (88-172)/(40-79) 145/59 (12/13 0900) SpO2:  [90 %-100 %] 99 % (12/13 0900) Last BM Date : 06/26/24  Intake/Output from previous day: 12/12 0701 - 12/13 0700 In: 2855.6 [I.V.:1655.6] Out: 2400 [Urine:2100; Blood:300] Intake/Output this shift: No intake/output data recorded.  General appearance: alert and no distress Resp: clear to auscultation bilaterally Cardio: regular rate and rhythm Extremities: RIGHT groin central line in place- +DP; LEFT- groin incision- C/D/I, soft, leg/foot warm, +DP   Lab Results:  Recent Labs    06/27/24 0251 06/27/24 0819  WBC 23.1* 20.7*  HGB 10.8* 10.5*  HCT 35.4* 34.0*  PLT 311 321   BMET Recent Labs    06/26/24 1229 06/27/24 0605  NA  --  140  K  --  5.2*  CL  --  104  CO2  --  25  GLUCOSE  --  127*  BUN 41* 38*  CREATININE 1.35* 1.16*  CALCIUM   --  8.9   PT/INR No results for input(s): LABPROT, INR in the last 72 hours. ABG No results for input(s): PHART, HCO3 in the last 72 hours.  Invalid input(s): PCO2, PO2  Studies/Results: PERIPHERAL VASCULAR CATHETERIZATION Result Date: 06/27/2024 See surgical note for result.  PERIPHERAL VASCULAR CATHETERIZATION Result Date: 06/26/2024 See surgical note for result.   Anti-infectives: Anti-infectives (From admission, onward)    Start     Dose/Rate Route Frequency Ordered Stop   06/27/24 1405  vancomycin  (VANCOCIN ) powder  Status:  Discontinued          As needed 06/27/24 1406 06/27/24 1432   06/27/24 1403  gentamicin  (GARAMYCIN ) injection  Status:  Discontinued          As needed 06/27/24 1405 06/27/24 1432   06/27/24 0900  ceFAZolin  (ANCEF ) IVPB 2g/100 mL premix         2 g 200 mL/hr over 30 Minutes Intravenous  Once 06/27/24 0855 06/28/24 0400   06/26/24 1148  ceFAZolin  (ANCEF ) IVPB 2g/100 mL premix        2 g 200 mL/hr over 30 Minutes Intravenous 30 min pre-op 06/26/24 1148 06/26/24 1340       Assessment/Plan: s/p Procedures: Left common femoral and superficial femoral artery endarterectomies and patch angioplasty (Left) APPLICATION OF CELL SAVER (Left) PTA LEFT popliteal and Posterior tibial; LEFT EIA stenting  D/C foley D/C central line once peripheral access obtained D/C Heparin  gtt- begin Eliquis  OOB to chair- begin PT/OT Encourage PO  Possible transfer to floor later today or tomorrow   LOS: 2 days    Tisa Dakin A 06/28/2024

## 2024-06-28 NOTE — Plan of Care (Signed)
°  Problem: Education: Goal: Knowledge of General Education information will improve Description: Including pain rating scale, medication(s)/side effects and non-pharmacologic comfort measures Outcome: Progressing   Problem: Health Behavior/Discharge Planning: Goal: Ability to manage health-related needs will improve Outcome: Progressing   Problem: Clinical Measurements: Goal: Ability to maintain clinical measurements within normal limits will improve Outcome: Progressing Goal: Will remain free from infection Outcome: Progressing Goal: Diagnostic test results will improve Outcome: Progressing Goal: Respiratory complications will improve Outcome: Progressing Goal: Cardiovascular complication will be avoided Outcome: Progressing   Problem: Activity: Goal: Risk for activity intolerance will decrease Outcome: Progressing   Problem: Nutrition: Goal: Adequate nutrition will be maintained Outcome: Progressing   Problem: Coping: Goal: Level of anxiety will decrease Outcome: Progressing   Problem: Elimination: Goal: Will not experience complications related to bowel motility Outcome: Progressing Goal: Will not experience complications related to urinary retention Outcome: Progressing   Problem: Pain Managment: Goal: General experience of comfort will improve and/or be controlled Outcome: Progressing   Problem: Safety: Goal: Ability to remain free from injury will improve Outcome: Progressing   Problem: Skin Integrity: Goal: Risk for impaired skin integrity will decrease Outcome: Progressing   Problem: Education: Goal: Knowledge of General Education information will improve Description: Including pain rating scale, medication(s)/side effects and non-pharmacologic comfort measures Outcome: Progressing   Problem: Clinical Measurements: Goal: Ability to maintain clinical measurements within normal limits will improve Outcome: Progressing Goal: Will remain free from  infection Outcome: Progressing Goal: Diagnostic test results will improve Outcome: Progressing Goal: Respiratory complications will improve Outcome: Progressing   Problem: Activity: Goal: Risk for activity intolerance will decrease Outcome: Progressing   Problem: Nutrition: Goal: Adequate nutrition will be maintained Outcome: Progressing   Problem: Coping: Goal: Level of anxiety will decrease Outcome: Progressing   Problem: Elimination: Goal: Will not experience complications related to bowel motility Outcome: Progressing   Problem: Pain Managment: Goal: General experience of comfort will improve and/or be controlled Outcome: Progressing   Problem: Safety: Goal: Ability to remain free from injury will improve Outcome: Progressing   Problem: Skin Integrity: Goal: Risk for impaired skin integrity will decrease Outcome: Progressing

## 2024-06-28 NOTE — Plan of Care (Signed)
°  Problem: Clinical Measurements: Goal: Ability to maintain clinical measurements within normal limits will improve Outcome: Progressing Goal: Will remain free from infection Outcome: Progressing Goal: Respiratory complications will improve Outcome: Progressing   Problem: Coping: Goal: Level of anxiety will decrease Outcome: Progressing   Problem: Elimination: Goal: Will not experience complications related to urinary retention Outcome: Progressing   Problem: Pain Managment: Goal: General experience of comfort will improve and/or be controlled Outcome: Progressing   Problem: Safety: Goal: Ability to remain free from injury will improve Outcome: Progressing

## 2024-06-29 LAB — CBC
HCT: 23 % — ABNORMAL LOW (ref 36.0–46.0)
Hemoglobin: 7.1 g/dL — ABNORMAL LOW (ref 12.0–15.0)
MCH: 28.5 pg (ref 26.0–34.0)
MCHC: 30.9 g/dL (ref 30.0–36.0)
MCV: 92.4 fL (ref 80.0–100.0)
Platelets: 238 K/uL (ref 150–400)
RBC: 2.49 MIL/uL — ABNORMAL LOW (ref 3.87–5.11)
RDW: 14.4 % (ref 11.5–15.5)
WBC: 12.9 K/uL — ABNORMAL HIGH (ref 4.0–10.5)
nRBC: 0 % (ref 0.0–0.2)

## 2024-06-29 LAB — BASIC METABOLIC PANEL WITH GFR
Anion gap: 7 (ref 5–15)
BUN: 27 mg/dL — ABNORMAL HIGH (ref 8–23)
CO2: 27 mmol/L (ref 22–32)
Calcium: 9 mg/dL (ref 8.9–10.3)
Chloride: 108 mmol/L (ref 98–111)
Creatinine, Ser: 0.85 mg/dL (ref 0.44–1.00)
GFR, Estimated: >60 mL/min (ref >60)
Glucose, Bld: 102 mg/dL — ABNORMAL HIGH (ref 70–99)
Potassium: 3.7 mmol/L (ref 3.5–5.1)
Sodium: 142 mmol/L (ref 135–145)

## 2024-06-29 LAB — PREPARE RBC (CROSSMATCH)

## 2024-06-29 MED ORDER — SODIUM CHLORIDE 0.9% IV SOLUTION: Freq: Once | INTRAVENOUS | Status: AC

## 2024-06-29 NOTE — Plan of Care (Signed)

## 2024-06-29 NOTE — Evaluation (Signed)
 Physical Therapy Evaluation Patient Details Name: Megan Irwin MRN: [id number] DOB: [birth year] Today's Date: 06/29/2024  History of Present Illness  78 y/o female here for planned surgery for ischemic leg.  s/p eft common femoral and superficial femoral artery endarterectomies and patch angioplasty 12/12.  Clinical Impression  Pt pleasant and though she was initially hesitant to do a lot with PT did understand the importance of getting up and moving post procedure.  She ultimately showed good bed mobility and transition to standing despite some expected L groin pain.  She managed static standing w/o AD, but was heavily reliant WBing on R, did encouage walker use for ambulation with which she showed greatly improved tolerance, stability and safety.  Pt with good overall effort, some fatigue with ~43ft of ambulation but no safety concerns.  Pt will benefit from continued PT to address functional limiations.      If plan is discharge home, recommend the following: A little help with walking and/or transfers;A little help with bathing/dressing/bathroom;Assistance with cooking/housework;Assist for transportation;Help with stairs or ramp for entrance   Can travel by private vehicle        Equipment Recommendations  (has ADs)  Recommendations for Other Services       Functional Status Assessment Patient has had a recent decline in their functional status and demonstrates the ability to make significant improvements in function in a reasonable and predictable amount of time.     Precautions / Restrictions Precautions Precautions: Fall Restrictions Weight Bearing Restrictions Per Provider Order: No      Mobility  Bed Mobility Overal bed mobility: Modified Independent             General bed mobility comments: able to get herself up to sitting w/o assist, only needing light rail use    Transfers Overall transfer level: Modified independent Equipment used: Rolling walker (2  wheels)               General transfer comment: light cuing for appropriate sequencing but no physcial assist needed to attain standing    Ambulation/Gait Ambulation/Gait assistance: Supervision Gait Distance (Feet): 70 Feet Assistive device: Rolling walker (2 wheels)         General Gait Details: Pt with slow and deliberate gait (expectedly hesitant with L WBing) but overall showed good safety and with cuing for appropriate use/positioning of AD did relatively well.  SpO2 remained in the high 90s and HR was stable in the 80s with the effort  Stairs            Wheelchair Mobility     Tilt Bed    Modified Rankin (Stroke Patients Only)       Balance Overall balance assessment: Needs assistance Sitting-balance support: No upper extremity supported Sitting balance-Leahy Scale: Normal     Standing balance support: Bilateral upper extremity supported Standing balance-Leahy Scale: Fair Standing balance comment: able to stand w/o AD but clearly putting most of her weight on the R, much improved with walker use                             Pertinent Vitals/Pain Pain Assessment Pain Assessment: 0-10 Pain Score: 5  Pain Location: L groin/incision    Home Living Family/patient expects to be discharged to:: Private residence Living Arrangements: Children Available Help at Discharge: Available PRN/intermittently;Friend(s);Neighbor (neighbor present and reports being readily available) Type of Home: House Home Access: Stairs to enter Entrance Stairs-Rails: Can reach both  Entrance Stairs-Number of Steps: 4   Home Layout: One level Home Equipment: Agricultural Consultant (2 wheels);Cane - single point      Prior Function Prior Level of Function : Independent/Modified Independent             Mobility Comments: driving, running errands, able to stay active ADLs Comments: daughter helps as needed but pt voices independence     Extremity/Trunk Assessment    Upper Extremity Assessment Upper Extremity Assessment: Overall WFL for tasks assessed;Generalized weakness    Lower Extremity Assessment Lower Extremity Assessment: Overall WFL for tasks assessed;Generalized weakness       Communication   Communication Communication: No apparent difficulties    Cognition Arousal: Alert Behavior During Therapy: WFL for tasks assessed/performed   PT - Cognitive impairments: No apparent impairments                         Following commands: Intact       Cueing Cueing Techniques: Verbal cues     General Comments General comments (skin integrity, edema, etc.): Pt's HGB 7.1, to get transfusion later today - pt largely asymptomatic but does endorse general fatigue/weakness    Exercises     Assessment/Plan    PT Assessment Patient needs continued PT services  PT Problem List Decreased activity tolerance;Decreased balance;Decreased strength;Decreased knowledge of use of DME;Decreased safety awareness;Pain       PT Treatment Interventions DME instruction;Gait training;Stair training;Functional mobility training;Therapeutic activities;Therapeutic exercise;Balance training;Neuromuscular re-education;Patient/family education    PT Goals (Current goals can be found in the Care Plan section)  Acute Rehab PT Goals Patient Stated Goal: go home PT Goal Formulation: With patient Time For Goal Achievement: 07/12/24 Potential to Achieve Goals: Good    Frequency Min 2X/week     Co-evaluation               AM-PAC PT 6 Clicks Mobility  Outcome Measure Help needed turning from your back to your side while in a flat bed without using bedrails?: None Help needed moving from lying on your back to sitting on the side of a flat bed without using bedrails?: None Help needed moving to and from a bed to a chair (including a wheelchair)?: A Little Help needed standing up from a chair using your arms (e.g., wheelchair or bedside chair)?:  A Little Help needed to walk in hospital room?: A Little Help needed climbing 3-5 steps with a railing? : A Little 6 Click Score: 20    End of Session Equipment Utilized During Treatment: Gait belt Activity Tolerance: Patient tolerated treatment well;Patient limited by fatigue Patient left: in chair;with call bell/phone within reach;with family/visitor present Nurse Communication: Mobility status PT Visit Diagnosis: Muscle weakness (generalized) (M62.81);Difficulty in walking, not elsewhere classified (R26.2);Pain;Other abnormalities of gait and mobility (R26.89) Pain - Right/Left: Left Pain - part of body: Hip    Time: 8841-8778 PT Time Calculation (min) (ACUTE ONLY): 23 min   Charges:   PT Evaluation $PT Eval Low Complexity: 1 Low PT Treatments $Gait Training: 8-22 mins PT General Charges $$ ACUTE PT VISIT: 1 Visit         Carmin JONELLE Deed, DPT 06/29/2024, 2:05 PM

## 2024-06-29 NOTE — Plan of Care (Signed)
°  Problem: Education: Goal: Knowledge of General Education information will improve Description: Including pain rating scale, medication(s)/side effects and non-pharmacologic comfort measures Outcome: Progressing   Problem: Health Behavior/Discharge Planning: Goal: Ability to manage health-related needs will improve Outcome: Progressing   Problem: Clinical Measurements: Goal: Ability to maintain clinical measurements within normal limits will improve Outcome: Progressing Goal: Will remain free from infection Outcome: Progressing Goal: Diagnostic test results will improve Outcome: Progressing Goal: Respiratory complications will improve Outcome: Progressing Goal: Cardiovascular complication will be avoided Outcome: Progressing   Problem: Activity: Goal: Risk for activity intolerance will decrease Outcome: Progressing   Problem: Nutrition: Goal: Adequate nutrition will be maintained Outcome: Progressing   Problem: Coping: Goal: Level of anxiety will decrease Outcome: Progressing   Problem: Elimination: Goal: Will not experience complications related to bowel motility Outcome: Progressing Goal: Will not experience complications related to urinary retention Outcome: Progressing   Problem: Pain Managment: Goal: General experience of comfort will improve and/or be controlled Outcome: Progressing   Problem: Safety: Goal: Ability to remain free from injury will improve Outcome: Progressing   Problem: Skin Integrity: Goal: Risk for impaired skin integrity will decrease Outcome: Progressing   Problem: Education: Goal: Knowledge of General Education information will improve Description: Including pain rating scale, medication(s)/side effects and non-pharmacologic comfort measures Outcome: Progressing   Problem: Clinical Measurements: Goal: Ability to maintain clinical measurements within normal limits will improve Outcome: Progressing Goal: Will remain free from  infection Outcome: Progressing Goal: Diagnostic test results will improve Outcome: Progressing Goal: Respiratory complications will improve Outcome: Progressing   Problem: Activity: Goal: Risk for activity intolerance will decrease Outcome: Progressing   Problem: Nutrition: Goal: Adequate nutrition will be maintained Outcome: Progressing   Problem: Coping: Goal: Level of anxiety will decrease Outcome: Progressing   Problem: Elimination: Goal: Will not experience complications related to bowel motility Outcome: Progressing   Problem: Pain Managment: Goal: General experience of comfort will improve and/or be controlled Outcome: Progressing   Problem: Safety: Goal: Ability to remain free from injury will improve Outcome: Progressing   Problem: Skin Integrity: Goal: Risk for impaired skin integrity will decrease Outcome: Progressing

## 2024-06-29 NOTE — Progress Notes (Signed)
 2 Days Post-Op   Subjective/Chief Complaint: Overall without complaint. Mild pain in LEFT groin. HgB 7.1 this morning. Asymptomatic.   Objective: Vital signs in last 24 hours: Temp:  [98 F (36.7 C)-98.5 F (36.9 C)] 98.2 F (36.8 C) (12/14 0800) Pulse Rate:  [42-142] 67 (12/14 0900) Resp:  [13-25] 19 (12/14 0900) BP: (121-159)/(49-78) 158/55 (12/14 0900) SpO2:  [73 %-100 %] 100 % (12/14 0900) Last BM Date : 06/28/24  Intake/Output from previous day: 12/13 0701 - 12/14 0700 In: -  Out: 775 [Urine:775] Intake/Output this shift: Total I/O In: -  Out: 300 [Urine:300]  General appearance: alert and no distress Resp: clear to auscultation bilaterally Cardio: regular rate and rhythm Extremities: LEFT groin incision- C/D/I, soft, no hematoma, no S/SX of bleeding; +DP/PT, foot warm, edema of left foot  Lab Results:  Recent Labs    06/28/24 1135 06/29/24 0142  WBC 20.2* 12.9*  HGB 8.9* 7.1*  HCT 27.9* 23.0*  PLT 277 238   BMET Recent Labs    06/27/24 0605 06/29/24 0142  NA 140 142  K 5.2* 3.7  CL 104 108  CO2 25 27  GLUCOSE 127* 102*  BUN 38* 27*  CREATININE 1.16* 0.85  CALCIUM  8.9 9.0   PT/INR No results for input(s): LABPROT, INR in the last 72 hours. ABG No results for input(s): PHART, HCO3 in the last 72 hours.  Invalid input(s): PCO2, PO2  Studies/Results: No results found.  Anti-infectives: Anti-infectives (From admission, onward)    Start     Dose/Rate Route Frequency Ordered Stop   06/27/24 1405  vancomycin  (VANCOCIN ) powder  Status:  Discontinued          As needed 06/27/24 1406 06/27/24 1432   06/27/24 1403  gentamicin  (GARAMYCIN ) injection  Status:  Discontinued          As needed 06/27/24 1405 06/27/24 1432   06/27/24 0900  ceFAZolin  (ANCEF ) IVPB 2g/100 mL premix        2 g 200 mL/hr over 30 Minutes Intravenous  Once 06/27/24 0855 06/28/24 0400   06/26/24 1148  ceFAZolin  (ANCEF ) IVPB 2g/100 mL premix        2 g 200 mL/hr  over 30 Minutes Intravenous 30 min pre-op 06/26/24 1148 06/26/24 1340       Assessment/Plan: s/p Procedures: Left common femoral and superficial femoral artery endarterectomies and patch angioplasty (Left) APPLICATION OF CELL SAVER (Left) PTA LEFT popliteal and Posterior tibial; LEFT EIA stenting    LOS: 3 days   Transfuse 1u pRBC OOB with PT after transfusion OK to transfer to floor later Eliquis   Martina Brodbeck A 06/29/2024

## 2024-06-30 ENCOUNTER — Other Ambulatory Visit: Payer: Self-pay

## 2024-06-30 ENCOUNTER — Ambulatory Visit

## 2024-06-30 ENCOUNTER — Encounter: Payer: Self-pay | Admitting: Vascular Surgery

## 2024-06-30 LAB — TYPE AND SCREEN
ABO/RH(D): A POS
Antibody Screen: NEGATIVE
Unit division: 0

## 2024-06-30 LAB — BPAM RBC
Blood Product Expiration Date: 202601112359
ISSUE DATE / TIME: 202512141315
Unit Type and Rh: 6200

## 2024-06-30 MED ORDER — APIXABAN 5 MG PO TABS
5.0000 mg | ORAL_TABLET | Freq: Two times a day (BID) | ORAL | 2 refills | Status: DC
  Filled 2024-06-30: qty 60, 30d supply, fill #0

## 2024-06-30 MED ORDER — ASPIRIN 81 MG PO TBEC
81.0000 mg | DELAYED_RELEASE_TABLET | Freq: Every day | ORAL | 12 refills | Status: DC
  Filled 2024-06-30: qty 30, 30d supply, fill #0

## 2024-06-30 NOTE — Discharge Instructions (Signed)
 Vascular surgery discharge instructions.  Do not lift anything heavy for the next 2 weeks.  Do not lift anything heavier than a gallon of milk.  Do not drive until your staples are removed.  Do not drive while taking any narcotic medication.  Dressing changes/wound care to be done twice daily.  Paint left groin staple line with Betadine sticks twice daily.  You may shower on 07/01/2024 after you get home.  Let soap and water  run over the staple line in your left groin.  Do not scrub this area.  Pat completely dry after showering.  Paint staple incision line with Betadine stick.  Follow-up with vein and vascular services as scheduled for staple removal.

## 2024-06-30 NOTE — Plan of Care (Signed)
  Problem: Education: Goal: Knowledge of General Education information will improve Description: Including pain rating scale, medication(s)/side effects and non-pharmacologic comfort measures Outcome: Progressing   Problem: Coping: Goal: Level of anxiety will decrease Outcome: Progressing   Problem: Pain Managment: Goal: General experience of comfort will improve and/or be controlled Outcome: Progressing

## 2024-06-30 NOTE — Progress Notes (Signed)
 PHARMACY - ANTICOAGULATION CONSULT NOTE  Pharmacy Consult for apixaban  Indication: Post Femoral Bypass per vascular  Allergies[1]  Patient Measurements: Height: 5' 3 (160 cm) Weight: 53.1 kg (117 lb) IBW/kg (Calculated) : 52.4 HEPARIN  DW (KG): 53.1  Vital Signs: Temp: 98.3 F (36.8 C) (12/15 0847) Temp Source: Oral (12/15 0422) BP: 161/71 (12/15 0847) Pulse Rate: 66 (12/15 0847)  Labs: Recent Labs    06/28/24 1135 06/29/24 0142  HGB 8.9* 7.1*  HCT 27.9* 23.0*  PLT 277 238  CREATININE  --  0.85    Estimated Creatinine Clearance: 43.7 mL/min (by C-G formula based on SCr of 0.85 mg/dL).   Medical History: Past Medical History:  Diagnosis Date   Anxiety    Cholelithiasis 09/2017   Dysrhythmia 2019   skipping a beat with palpatations   GERD (gastroesophageal reflux disease)    food gets stuck like an esophagitis   Hypertension    Insomnia    Migraines    magnesium  helps   NAFL (nonalcoholic fatty liver)    Restless leg syndrome    uses heating pad to resolve   Sciatica 2019   pt is in a good place after getting an SI joint injection    Medications:  Medications Prior to Admission  Medication Sig Dispense Refill Last Dose/Taking   apixaban  (ELIQUIS ) 5 MG TABS tablet Take 1 tablet (5 mg total) by mouth 2 (two) times daily. (Patient not taking: No sig reported) 60 tablet 3 Not Taking   aspirin  EC 81 MG tablet Take 1 tablet (81 mg total) by mouth daily. Swallow whole. (Patient taking differently: Take 81 mg by mouth 2 (two) times daily. Swallow whole. One in the morning and one in the evening.) 150 tablet 2 06/26/2024   atenolol  (TENORMIN ) 100 MG tablet Take 100 mg by mouth daily. In the morning   06/26/2024   atorvastatin  (LIPITOR) 20 MG tablet Take 1 tablet (20 mg total) by mouth daily at 6 PM. Patient only takes half a tablet most times 30 tablet 5 Past Week   B Complex-C (SUPER B COMPLEX PO) Take 1 capsule by mouth daily.   Past Month   Calcium  Magnesium   Zinc  333-133-5 MG TABS Take 1 tablet by mouth daily.   06/26/2024   chlorthalidone  (HYGROTON ) 25 MG tablet Take 25 mg by mouth daily.   06/26/2024   gabapentin  (NEURONTIN ) 300 MG capsule Take 300 mg by mouth daily.   06/25/2024   tizanidine  (ZANAFLEX ) 2 MG capsule Take 2 mg by mouth at bedtime as needed for muscle spasms. Take a 1/2 tablet   06/25/2024   traMADol  (ULTRAM ) 50 MG tablet Take 1 tablet (50 mg total) by mouth every 6 (six) hours as needed. 20 tablet 0 06/25/2024   valsartan (DIOVAN) 160 MG tablet Take 160 mg by mouth daily.   06/25/2024   Scheduled:   apixaban   5 mg Oral BID   atenolol   100 mg Oral Daily   atorvastatin   20 mg Oral QHS   Chlorhexidine  Gluconate Cloth  6 each Topical Daily   chlorthalidone   25 mg Oral Daily   gabapentin   300 mg Oral QHS   irbesartan   150 mg Oral Daily   sodium chloride  flush  10-40 mL Intracatheter Q12H   tiZANidine   2 mg Oral QHS   Infusions:   Assessment: 78 yo F w/ significant disease in the left common femoral and proximal superficial femoral artery require femoral endarterectomy,left lower extremity with stent thrombosis.  78 year old female with  a history of stent placement who presents with acute leg pain and numbness.  She has a previous history of peripheral vascular intervention in August.   She has a history of stent placement and was previously on Plavix , which she discontinued after experiencing severe side effects. Hgb 7.1  Plt 238  Goal of Therapy:  Monitor platelets by anticoagulation protocol: Yes   Plan:  Will continue currently ordered apixaban  5 mg po bid. Vascular also wants patient to take with aspirin  81 mg Follow CBC/renal fxn per protocol   Allean Haas PharmD Clinical Pharmacist 06/30/2024       [1]  Allergies Allergen Reactions   Calcium  Channel Blockers Shortness Of Breath, Anxiety, Palpitations and Other (See Comments)    Cognitive effects also--difficulty thinking   Hydrochlorothiazide  Shortness Of Breath   Sodium Metabisulfite Anaphylaxis    Sulfite--in foods   Sulfites Swelling    Can't breathe   Telmisartan-Hctz Shortness Of Breath    Chest pain & fatigue    Cardizem [Diltiazem Hcl] Other (See Comments)    Difficulty eating, sleeping, and concentrating Heart goes crazy    Sulfa Antibiotics Hives   Acyclovir And Related    Plavix  [Clopidogrel ]    Lexapro [Escitalopram Oxalate] Other (See Comments)    Gi upset

## 2024-06-30 NOTE — Discharge Summary (Signed)
 [facility]    Discharge Summary    Patient ID:  Megan Irwin MRN: [id number] DOB/AGE: 80/05/1946 80 y.o.  Admit date: 06/26/2024 Discharge date: 06/30/2024 Date of Surgery: 06/27/2024 Surgeon: Surgeon(s): Marea Selinda GORMAN, MD  Admission Diagnosis: Critical limb ischemia of left lower extremity (HCC) [I70.222] Ischemic leg [I99.8]  Discharge Diagnoses:  Critical limb ischemia of left lower extremity (HCC) [I70.222] Ischemic leg [I99.8]  Secondary Diagnoses: Past Medical History:  Diagnosis Date   Anxiety    Cholelithiasis 09/2017   Dysrhythmia 2019   skipping a beat with palpatations   GERD (gastroesophageal reflux disease)    food gets stuck like an esophagitis   Hypertension    Insomnia    Migraines    magnesium  helps   NAFL (nonalcoholic fatty liver)    Restless leg syndrome    uses heating pad to resolve   Sciatica 2019   pt is in a good place after getting an SI joint injection    Procedures: Left common femoral and superficial femoral artery endarterectomies and patch angioplasty APPLICATION OF CELL SAVER  Discharged Condition: good  HPI:  Patient is a 78 y.o.female with an ischemic left lower extremity with rest pain.  She has run overnight thrombolytic therapy and is brought back for second look angiography. The patient is brought in for angiography for further evaluation and potential treatment.  Due to the limb threatening nature of the situation, angiogram was performed for attempted limb salvage. The patient is aware that if the procedure fails, amputation would be expected.  The patient also understands that even with successful revascularization, amputation may still be required due to the severity of the situation.   Hospital Course:  Megan Irwin is a 78 y.o. female is S/P Left common femoral and superficial femoral artery endarterectomies and patch angioplasty Patient is resting comfortably in bed this morning.  She  is endorsing that she is not having any pain.  Patient has been ambulating the halls without a walker.  Patient has been eating well without any nausea vomiting or diarrhea.  Patient has been urinating well.  Patient be discharged later today.   Date of Surgery: 06/27/2024   Surgeon(s):DEW,JASON     Assistants:none   Pre-operative Diagnosis: PAD with rest pain left lower extremity, status post overnight thrombolytic therapy   Post-operative diagnosis:  Same   Procedure(s) Performed:             1.  Angiogram left lower extremity             2.  Angioplasty of the left posterior tibial artery with 2 mm diameter by 30 cm length angioplasty balloon             3.  Angioplasty of the left popliteal artery with 3 mm diameter by 15 cm length angioplasty balloon             4.  Stent placement to the left external iliac artery with 6 mm diameter by 10 cm length Viabahn stent             5.  StarClose closure device right femoral artery   EBL: 5 cc   Contrast: 40 cc   Fluoro Time: 3.4 minutes  Date of Surgery  06/27/2024  PROCEDURE: 1.   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  significant plaque in left common femoral and superficial femoral arteries   SPECIMEN(S):  Left common femoral and superficial femoral artery plaque.  Patient is being discharged on aspirin  81 mg daily, Eliquis  5 mg twice daily and Lipitor 20 mg daily.  Patient was instructed to not miss or skip taking any this medications visible to the outcome of her surgery or procedures.  She verbalized her understanding.  I spent greater than 60 minutes in developing, implementing, teaching, and discharging this patient  today.   Physical Exam:   Alert notes x3, no acute distress Face: Symmetrical.  Tongue is midline. Neck: Trachea is midline.  No swelling or bruising. Cardiovascular: Regular rate and rhythm Pulmonary: Clear to auscultation bilaterally Abdomen: Soft, nontender, nondistended Right groin access: Clean dry and intact.  No swelling or drainage noted Left groin access: Clean dry and intact.  No swelling or drainage noted Left lower extremity: Thigh soft.  Calf soft.  Extremities warm distally toes. Hard to palpate pedal pulses however the foot is warm is her good capillary refill. Right lower extremity: Thigh soft.  Calf soft.  Extremities warm distally toes. Hard to palpate pedal pulses however the foot is warm is her good capillary refill. Neurological: No deficits noted   Post-op wounds:  clean, dry, intact or healing well  Pt. Ambulating, voiding and taking PO diet without difficulty. Pt pain controlled with PO pain meds.  Labs:  As below  Complications: none  Consults:    Significant Diagnostic Studies: CBC Lab Results  Component Value Date   WBC 12.9 (H) 06/29/2024   HGB 7.1 (L) 06/29/2024   HCT 23.0 (L) 06/29/2024   MCV 92.4 06/29/2024   PLT 238 06/29/2024    BMET    Component Value Date/Time   NA 142 06/29/2024 0142   K 3.7 06/29/2024 0142   CL 108 06/29/2024 0142   CO2 27 06/29/2024 0142   GLUCOSE 102 (H) 06/29/2024 0142   BUN 27 (H) 06/29/2024 0142   CREATININE 0.85 06/29/2024 0142   CALCIUM  9.0 06/29/2024 0142   GFRNONAA >60 06/29/2024 0142   GFRAA >60 08/30/2017 1644   COAG No results found for: INR, PROTIME   Disposition:  Discharge to :Home  Allergies as of 06/30/2024       Reactions   Calcium  Channel Blockers Shortness Of Breath, Anxiety, Palpitations, Other (See Comments)   Cognitive effects also--difficulty thinking   Hydrochlorothiazide Shortness Of Breath   Sodium Metabisulfite Anaphylaxis   Sulfite--in foods   Sulfites  Swelling   Can't breathe   Telmisartan-hctz Shortness Of Breath   Chest pain & fatigue    Cardizem [diltiazem Hcl] Other (See Comments)   Difficulty eating, sleeping, and concentrating Heart goes crazy   Sulfa Antibiotics Hives   Acyclovir And Related    Plavix  [clopidogrel ]    Lexapro [escitalopram Oxalate] Other (See Comments)   Gi upset        Medication List     TAKE these medications    apixaban  5 MG Tabs tablet Commonly known as: Eliquis  Take 1 tablet (5 mg total) by mouth 2 (two) times daily.   aspirin  EC 81 MG tablet Take 1 tablet (81 mg total) by mouth daily. Swallow whole. What changed:  when to take this additional instructions   atenolol  100 MG tablet Commonly known as: TENORMIN  Take 100 mg  by mouth daily. In the morning   atorvastatin  20 MG tablet Commonly known as: LIPITOR Take 1 tablet (20 mg total) by mouth daily at 6 PM. Patient only takes half a tablet most times   Calcium  Magnesium  Zinc  333-133-5 MG Tabs Take 1 tablet by mouth daily.   chlorthalidone  25 MG tablet Commonly known as: HYGROTON  Take 25 mg by mouth daily.   gabapentin  300 MG capsule Commonly known as: NEURONTIN  Take 300 mg by mouth daily.   SUPER B COMPLEX PO Take 1 capsule by mouth daily.   tizanidine  2 MG capsule Commonly known as: ZANAFLEX  Take 2 mg by mouth at bedtime as needed for muscle spasms. Take a 1/2 tablet   traMADol  50 MG tablet Commonly known as: ULTRAM  Take 1 tablet (50 mg total) by mouth every 6 (six) hours as needed.   valsartan 160 MG tablet Commonly known as: DIOVAN Take 160 mg by mouth daily.       Verbal and written Discharge instructions given to the patient. Wound care per Discharge AVS  Contact information for follow-up providers     Brown, Fallon E, NP Follow up in 3 week(s).   Specialty: Vascular Surgery Why: Staple removal post Left Femoral endarterectomy. Contact information: [address] [place]  [address] [phone]              Contact information for after-discharge care     Home Medical Care     [facility] [place] .   Service: Home Health Services Contact information: [phone] [place]  [phone]                     Signed: Gwendlyn JONELLE Shank, NP  06/30/2024, 12:30 PM

## 2024-06-30 NOTE — Care Management Important Message (Signed)
 Important Message  Patient Details  Name: Megan Irwin MRN: [id number] Date of Birth: 1946/05/22   Important Message Given:  Yes - Medicare IM     Nilan Iddings W, CMA 06/30/2024, 10:58 AM

## 2024-06-30 NOTE — TOC Transition Note (Signed)
 Transition of Care [facility]) - Discharge Note   Patient Details  Name: Megan Irwin MRN: [id number] Date of Birth: 12-04-1945  Transition of [facility]) CM/SW Contact:  Alvaro Louder, LCSW Phone Number: 06/30/2024, 4:16 PM   Clinical Narrative:   LCSWA discussed PT recommendation of HH patient was agreeable. LCSWA reached out to [facility] admissions coordinator and started service for patient. The patient reported that he would have family come pick her up.   TOC signing off          Patient Goals and CMS Choice            Discharge Placement                       Discharge Plan and Services Additional resources added to the After Visit Summary for                                       Social Drivers of Health (SDOH) Interventions SDOH Screenings   Food Insecurity: No Food Insecurity (06/26/2024)  Housing: Low Risk (06/27/2024)  Transportation Needs: No Transportation Needs (06/26/2024)  Utilities: Not At Risk (06/26/2024)  Financial Resource Strain: Low Risk  (12/25/2023)   Received from [facility]  Social Connections: Moderately Integrated (06/26/2024)  Tobacco Use: Medium Risk (06/27/2024)     Readmission Risk Interventions     No data to display

## 2024-06-30 NOTE — TOC Initial Note (Signed)
 Transition of Care [facility]) - Initial/Assessment Note    Patient Details  Name: Megan Irwin MRN: [id number] Date of Birth: [birth year]  Transition of Care [facility]) CM/SW Contact:    Alvaro Louder, LCSW Phone Number: 06/30/2024, 11:30 AM  Clinical Narrative:     LCSWA spoke to the patient at the bedside and discussed PT recommendation of HH. Patient was agreeable and selected [facility]. [facility] has been notified. Patient will discharge with [facility] PT, OT, and RN.   TOC to follow for discharge              Patient Goals and CMS Choice            Expected Discharge Plan and Services                                              Prior Living Arrangements/Services                       Activities of Daily Living   ADL Screening (condition at time of admission) Independently performs ADLs?: Yes (appropriate for developmental age) Is the patient deaf or have difficulty hearing?: No Does the patient have difficulty seeing, even when wearing glasses/contacts?: No Does the patient have difficulty concentrating, remembering, or making decisions?: No  Permission Sought/Granted                  Emotional Assessment              Admission diagnosis:  Critical limb ischemia of left lower extremity (HCC) [I70.222] Ischemic leg [I99.8] Patient Active Problem List   Diagnosis Date Noted   Ischemic leg 06/26/2024   Atherosclerosis of native arteries of extremity with intermittent claudication 03/14/2022   Prediabetes 02/16/2022   Pain in joint of right ankle 12/16/2021   Backache 08/19/2021   History of 2019 novel coronavirus disease (COVID-19) 03/02/2021   Atherosclerosis of native coronary artery of native heart without angina pectoris 06/10/2020   Closed displaced oblique fracture of shaft of left femur (HCC) 06/10/2020   Dyslipidemia 06/10/2020   Generalized anxiety disorder 06/10/2020   Tobacco abuse 06/10/2020   Tobacco abuse  counseling 06/10/2020   Old inferior wall myocardial infarction 10/28/2019   SOBOE (shortness of breath on exertion) 09/01/2019   Other chest pain 08/05/2019   S/P cholecystectomy 12/24/2017   Cervical myofascial pain syndrome 11/26/2017   History of tobacco use 11/26/2017   Non-alcoholic fatty liver disease 09/08/2017   Restless legs syndrome (RLS) 09/08/2017   History of depression 01/09/2017   Allergic rhinitis 01/02/2017   Esophageal reflux 01/02/2017   Insomnia 01/02/2017   Osteoporosis 01/02/2017   Lumbosacral spondylosis without myelopathy 12/07/2015   Disc degeneration, lumbar 02/24/2015   Sacroiliac pain 02/24/2015   Acquired pronation of foot 04/13/2014   Pes planus 04/13/2014   Dysphagia 11/03/2013   Peroneal tendonitis 11/03/2013   Depression 10/15/2013   Epigastric pain 10/15/2013   Essential hypertension 10/15/2013   HLD (hyperlipidemia) 10/15/2013   Left foot pain 10/15/2013   Migraine 10/15/2013   Screen for colon cancer 10/15/2013   PCP:  Delfina Pao, MD Pharmacy:   [facility] [phone] [address] [place], [place] - [address] AT [facility] [address] [place] [address] [place] [address] Phone: [phone] Fax: [phone]  Social Drivers of Health (SDOH) Social History: SDOH Screenings   Food Insecurity: No Food Insecurity (06/26/2024)  Housing: Low Risk (06/27/2024)  Transportation Needs: No Transportation Needs (06/26/2024)  Utilities: Not At Risk (06/26/2024)  Financial Resource Strain: Low Risk  (12/25/2023)   Received from [facility]  Social Connections: Moderately Integrated (06/26/2024)  Tobacco Use: Medium Risk (06/27/2024)   SDOH Interventions:     Readmission Risk Interventions     No data to display

## 2024-07-01 ENCOUNTER — Encounter: Payer: Self-pay | Admitting: Vascular Surgery

## 2024-07-01 LAB — SURGICAL PATHOLOGY

## 2024-07-04 ENCOUNTER — Telehealth (INDEPENDENT_AMBULATORY_CARE_PROVIDER_SITE_OTHER): Payer: Self-pay

## 2024-07-04 NOTE — Telephone Encounter (Signed)
 I called patient and stated that based on her symptoms she may want to be evaluated in the ED given her SOB and recent large surgery.  She recently had significant intervention of her lower extremities and she is high risk for limb loss if she continues not to remain on anticoagulation.  Unfortunately she did not answer so this was left in a VM

## 2024-07-04 NOTE — Telephone Encounter (Signed)
 Patient reach out to the office stating that the wound is healing well but she thinks she is having reaction to Eliquis . Since Wednesday she been having bilateral leg weakness, shortness of breath, and feels like a chill going down her leg all when she walks. Yesterday was worst but today she little better. When she is sitting or laying she has no symptoms. Patient states she not able to eat when she takes Eliquis . The last time she has taking the medication was Wednesday night. Patient had left common femoral endarterectomy on 06/27/24. Please Advise

## 2024-07-11 NOTE — Progress Notes (Signed)
 PROVIDER FACE TO FACE TRANSITIONAL CARE ENCOUNTER NOTE  I have reviewed the interactive contact provided: Yes Medical records from the hospitalization are available for review.  I have reviewed the discharge documentation and completed a medication reconciliation: Yes Discharge disposition: Home  Coding Eligibility   Did you communicate with the patient and/or caregiver within 2 business days of discharge OR did you document two or more separate communication attempts within 2 business days of discharge?  Yes (07/01/2024  4:43 PM)    Is the patient eligible for TCM billing? Yes - (patient is eligible for TCM coding) (07/01/2024  4:43 PM)        Subjective:   SUMMARY of hospitalization   Megan Irwin is a 78 y.o. female here today for transitional care hospital follow up visit. Patient was hospitalized at [facility] from 06/27/24 to 06/30/24. Chief reason for hospitalization was critical limb ischemia of left lower extremity.  Summary of hospitalization:    Admitted to [facility] for ischemic left lower extremity with pain at rest.  Had attempted overnight thrombolytic therapy.  Had left common femoral and superficial femoral artery endarterectomies and patch angioplasty on 06/27/24.  Was able to ambulate in hospital without a walker.  Eating normal diet.  Had significant anemia with hemoglobin as low as 7.1 and received transfusion.  Denies bleeding or lightheadedness.  Follow up recommendations   Follow-up with Dr. Marea on 07/23/2024  Interval history   History of Present Illness Megan Irwin is an 78 year old female who presents with postoperative swelling and concerns following a superficial artery endarterectomy and patch angioplasty.  She underwent a superficial artery endarterectomy and patch angioplasty due to a vein problem. During her hospital stay from Wednesday to Saturday, she received clot buster treatment that was unsuccessful, leading to  surgery. She does not recall the events clearly due to an amnesia-inducing drug administered during her hospital stay. She was discharged without receiving detailed paperwork about the procedure.  Postoperatively, she experiences significant swelling in her leg, which is expected to take four to six weeks to resolve. She has numbness and a lack of sensation in her toes. The incision site on her femoral artery remains hard and swollen, with staples still in place. She is currently undergoing physical therapy once a week to aid in recovery.  She has a history of intolerance to Eliquis  and Plavix , experiencing severe nausea, weakness, and rash, which led her to discontinue these medications. She currently takes regular aspirin  325 mg twice daily, which she tolerates well. She also takes rosuvastatin at a half dose every other day.  Her blood pressure has been well-controlled.  She feels weak and fatigued, requiring support to prevent her legs from collapsing when moving around the house. She has not been using a walker since returning home. Her daughter and oldest sister are present to assist her at home.  Home health has started physical therapy and will receive that therapy at least once a week for the next few weeks.  She has not been eating well but maintains her weight. She consumes breakfast with whole milk for protein and calorie intake. She monitors her condition closely, especially for changes in the incision site or swelling.  No chest pain or shortness of breath. She reports feeling pressure in her head when her blood pressure is high.   Patient denies any fever/chills, shortness of breath, chest pain, palpitations, lightheadedness or dizziness, abdominal pain, nausea/vomiting/diarrhea, urinary symptoms, tingling/numbness, focal weakness.  Referrals:  Are additional  post-hospitalization follow-up appointments needed? yes ____________________________________________ Discharge Follow-Up  Review-from Patient Outreach encounter flowsheet- No Flowsheet Data found = No issues at present  Patient Knowledge and Self Care    07/01/2024    4:43 PM  Patient Knowledge and Self-Care  Did the patient/caregiver understand the primary reason for hospitalization? Yes  Is the patient or the patient's caregiver able to describe what is necessary for self care at this time? Yes  Are there any additional educational needs identified today? No    Discharge Follow-Up Appointment    07/01/2024    4:43 PM  Appointments  Does the patient have an appropriate follow-up visit scheduled within 14 days of discharge? Yes  Date of Appointment: 07/11/2024  Are there any barriers to the patient keeping their appointment? No  Has the patient been hospitalized more than once in the past 30 days? No    Transportation Interventions Post Discharge    * No data to display          Medication Review Post Discharge    07/01/2024    4:43 PM  Medication Review  Were any new medicines prescribed at discharge? No  Was the patient able to get all of their prescriptions? No*  Additional comments No meds  Does the patient understand their medications including how to take them? Yes  Were there any medication interventions/escalations? No  Reconciled current and discharge medications? (RN and Pharmacist ONLY) No    Home Health     07/01/2024    4:43 PM  Home Health  Has your home care agency contacted you? Yes  Has your home care professional visited you after discharge? No  Barriers to Home Health visiting patient: Home Health visit rescheduled    Discharge Process    06/14/2020    9:10 AM  Discharge Issues  Were there any discharge issues? No *    * Data saved with a previous flowsheet row definition    New or Worsening Health Issues    07/01/2024    4:43 PM  New or Worsening Health Issues  Is the patient experiencing any new or worsening symptoms after discharge? No    ______________________________________________  ROS  Outpatient Medications Marked as Taking for the 07/11/24 encounter (Office Visit) with Delfina Darice Nice, MD  Medication Sig Dispense Refill   ascorbic acid, vitamin C, (VITAMIN C) 1000 MG tablet Take 1,000 mg by mouth once daily     atenoloL  (TENORMIN ) 100 MG tablet Take 1 tablet (100 mg total) by mouth once daily 100 tablet 3   azelastine (ASTELIN) 137 mcg nasal spray Place 1 spray into both nostrils 2 (two) times daily 30 mL 12   calcium -magnesium -zinc  Tab Take 1 tablet by mouth once daily     chlorthalidone  25 MG tablet Take 1 tablet (25 mg total) by mouth once daily 100 tablet 3   cyanocobalamin, vitamin B-12, (VITAMIN B-12 ORAL) Take 1 tablet by mouth once daily       fluticasone  propionate (FLONASE ) 50 mcg/actuation nasal spray Place 1 spray into both nostrils once daily 16 g 0   gabapentin  (NEURONTIN ) 300 MG capsule Take 1 capsule (300 mg total) by mouth at bedtime 100 capsule 3   MAGNESIUM  ORAL Take 500 mg by mouth as needed For migraines     meloxicam (MOBIC) 15 MG tablet TAKE 1 TABLET(15 MG) BY MOUTH DAILY AS NEEDED FOR PAIN 30 tablet 6   nortriptyline (PAMELOR) 25 MG capsule Take 1 capsule (25 mg total) by mouth  at bedtime 30 capsule 5   rosuvastatin (CRESTOR) 5 MG tablet Take 0.5 tablets (2.5 mg total) by mouth at bedtime     tiZANidine  (ZANAFLEX ) 2 MG tablet Take 1 tablet (2 mg total) by mouth 3 (three) times daily as needed 100 tablet 5   traMADoL  (ULTRAM ) 50 mg tablet Take 50 mg by mouth every 6 (six) hours as needed     valsartan (DIOVAN) 160 MG tablet TAKE 1 TABLET(160 MG) BY MOUTH EVERY DAY 100 tablet 3   vitamin B complex (B COMPLEX 1 ORAL) Take 1 tablet by mouth once daily      Patient Active Problem List  Diagnosis   Disc degeneration, lumbar   Sacroiliac pain   Acquired pronation of foot   Allergic rhinitis   Dysphagia   Esophageal reflux   Essential hypertension   HLD  (hyperlipidemia)   Insomnia   Lumbosacral spondylosis without myelopathy   Osteoporosis   Migraine   History of depression   Non-alcoholic fatty liver disease   Restless legs syndrome (RLS)   History of tobacco use   Cervical myofascial pain syndrome   S/P cholecystectomy   Other chest pain   SOBOE (shortness of breath on exertion)   Old inferior wall myocardial infarction   Closed displaced oblique fracture of shaft of left femur (CMS-HCC)   History of 2019 novel coronavirus disease (COVID-19)   Atherosclerosis of native coronary artery of native heart without angina pectoris   Generalized anxiety disorder   Backache   Pes planus   Prediabetes   Exudative age-related macular degeneration of right eye (CMS/HHS-HCC)   Rhinitis, chronic   Chronic maxillary sinusitis   Decreased sense of smell   Cervical spondylosis   Pulmonary nodule, right   S/P angioplasty with stent   Raynaud's disease without gangrene   Stage 3b chronic kidney disease (CMS-HCC)   Peripheral artery disease    Social Drivers of Health with Concerns   Tobacco Use: Medium Risk (07/11/2024)   Patient History    Smoking Tobacco Use: Former    Smokeless Tobacco Use: Never      07/01/2024  [employer] Authorization for Release of Information - [employer]   Are any of your needs urgent? No  Would you like help with any of the needs that you have identified? No    Objective   Vitals:   07/11/24 1446  BP: 111/67  Pulse: 73  Weight: 54 kg (119 lb)  PainSc:   6  PainLoc: Leg    Body mass index is 21.09 kg/m.  Wt Readings from Last 5 Encounters:  07/11/24 54 kg (119 lb)  06/19/24 54.5 kg (120 lb 2.4 oz)  04/16/24 53.3 kg (117 lb 8.1 oz)  03/13/24 53 kg (116 lb 13.5 oz)  02/12/24 54.1 kg (119 lb 4.3 oz)   Physical Exam VITALS: SaO2- 98%  Physical Exam  Constit- Well-appearing, no distress, alert Eyes- anicteric, PERRL, sclera clear, lids normal Mouth- moist  mucous membranes, throat without erythema or exudates  Neck- Supple, no masses, shotty cervical lymphadenopathy Cor- Regular rate and rhythm, normal S1S2, no murmurs  Lungs- clear to ascultation bilaterally, no rhonchi, wheeze  Skin-multiple staples at left anterior upper leg and groin with mild erythema, and firm skin in areas but no discharge Psych-alert and oriented 3, normal affect and mood   Recent labs - Lab Results  Component Value Date   WBC 12.2 (H) 06/19/2024   HGB 11.0 (L) 06/19/2024   HCT 35.5 06/19/2024  PLT 415 06/19/2024   - Lab Results  Component Value Date   NA 140 06/19/2024   K 5.2 (H) 06/19/2024   CL 104 06/19/2024   CO2 27 06/19/2024   BUN 27 (H) 06/19/2024   CREATININE 1.4 (H) 06/19/2024   CALCIUM  9.9 06/19/2024   ALB 3.7 06/19/2024   TBILI 0.3 (L) 06/19/2024   ALKPHOS 174 (H) 06/19/2024   AST 28 06/19/2024   ALT 25 06/19/2024   GLUCOSE 110 06/19/2024   GFR 38 06/19/2024    No results found.  Results   Assessment and Plan   78 y.o. female here today for transitional care hospital follow up visit.  Assessment & Plan Peripheral artery disease of left lower extremity, post-endarterectomy and bypass graft Post-operative status following superficial artery endarterectomy and patch angioplasty with vein graft. Significant swelling and numbness in the left lower extremity. Incision site healing well with some redness and inflammation. Oxygen saturation at 98% in toes. Swelling expected to persist for 4-6 weeks. Unable to tolerate Eliquis  and Plavix  due to severe side effects, including nausea and weakness. Currently on aspirin  325 mg twice daily, which is well tolerated. - Continue aspirin  325 mg twice daily. - Monitor for signs of infection or complications at the incision site. - Elevate the leg to reduce swelling. - Follow up with physical therapy to improve strength and balance. - Contact Dr. Marea if there is a significant change in left foot,  such as color change or increased coldness. - Seek emergency care if unable to contact Dr. Marea and if there are concerning changes in the left foot.  Essential hypertension Blood pressure is well controlled. She reports feeling pressure in the head when blood pressure is high, but has not been regularly monitoring at home. - Encouraged regular home blood pressure monitoring.  Mixed hyperlipidemia Continues on rosuvastatin at a reduced dose of half a tablet every other day due to previous intolerance at higher doses. - Continue rosuvastatin at half dose every other day.  Chronic kidney disease, stage 3b Emphasis on maintaining hydration to protect kidney function. - Ensure adequate hydration.  Fatigue and weakness Persistent fatigue and weakness post-surgery, likely due to recent hospitalization and surgery. Physical therapy is ongoing to improve strength and energy levels. - Continue physical therapy to improve strength and energy levels. - Encouraged adequate nutrition and hydration to support recovery.  History of anemia and received transfusion in hospital Continue to monitor.  Will recheck blood levels at follow-up in 3 weeks. Diagnoses and all orders for this visit:  Essential hypertension  S/P angioplasty with stent  Mixed hyperlipidemia  Swelling of foot joint, left  Osteoporosis without current pathological fracture, unspecified osteoporosis type  Stage 3b chronic kidney disease (CMS-HCC)  Strength loss of  Fatigue, unspecified type  Peripheral artery disease  History of anemia  Other orders -     Follow up in Primary Care; Future      Follow up recommendations/Ongoing Care Plan   Patient and/or caregiver verbalized understanding and all questions were answered. Patient and/or caregiver is agreeable with the plan outlined above. No barriers to learning were identified. High risk symptoms and red flags were discussed. Pt provided with clinic phone number  and advised to call with any questions or concerns.   This visit was coded based on medical decision making (MDM) for patient's issues and concerns as noted. Transitional Care Management medical decision making was moderate.    Follow up in 1 month (on 08/11/2024). Follow-up  Future Labs/Procedures Expected by Expires   Follow up in Primary Care [[id number] Custom]  08/11/2024 10/09/2024   Questions:     Does this order need to be coordinated with another visit or should it be hidden from the patient portal? If yes to either, the patient will need to stop by the front desk or call to schedule.: No   Who is this follow-up with?: Me   Can this appointment be overbooked by a scheduler?: No   What type of follow up is needed?: Office Visit   What's the reason for follow up?: Chronic Medical Problems Comment - check labs   Other (Add Comments)   Allow telemedicine?: In Person Only         Future Appointments     Date/Time Provider Department Center Visit Type   09/10/2024 9:30 AM (Arrive by 9:15 AM) Jane Delmar Pike, NP [facility] NEW PATIENT   12/30/2024 1:00 PM (Arrive by 12:45 PM) POPULATION HEALTH NURSE - [facility] WELLNESS   12/30/2024 2:00 PM (Arrive by 1:45 PM) Delfina Darice Nice, MD [facility] OFFICE VISIT       There are no Patient Instructions on file for this visit.  An after visit summary was provided for the patient either in written format or through My [facility]  This note has been created using automated tools and reviewed for accuracy by KAREN JUTTA BEHLING.   *Some images could not be shown.

## 2024-07-21 ENCOUNTER — Encounter (INDEPENDENT_AMBULATORY_CARE_PROVIDER_SITE_OTHER): Payer: Self-pay | Admitting: Nurse Practitioner

## 2024-07-21 ENCOUNTER — Ambulatory Visit (INDEPENDENT_AMBULATORY_CARE_PROVIDER_SITE_OTHER): Admitting: Nurse Practitioner

## 2024-07-21 ENCOUNTER — Other Ambulatory Visit (INDEPENDENT_AMBULATORY_CARE_PROVIDER_SITE_OTHER): Payer: Self-pay | Admitting: Nurse Practitioner

## 2024-07-21 VITALS — BP 157/90 | HR 93 | Resp 18 | Ht 63.0 in | Wt 118.4 lb

## 2024-07-21 DIAGNOSIS — Z9889 Other specified postprocedural states: Secondary | ICD-10-CM

## 2024-07-21 DIAGNOSIS — I739 Peripheral vascular disease, unspecified: Secondary | ICD-10-CM

## 2024-07-21 DIAGNOSIS — T8130XA Disruption of wound, unspecified, initial encounter: Secondary | ICD-10-CM

## 2024-07-21 MED ORDER — DOXYCYCLINE HYCLATE 100 MG PO CAPS: 100.0000 mg | ORAL_CAPSULE | Freq: Two times a day (BID) | ORAL | 0 refills | Status: DC

## 2024-07-21 MED ORDER — RIVAROXABAN 2.5 MG PO TABS: 2.5000 mg | ORAL_TABLET | Freq: Two times a day (BID) | ORAL | 0 refills | Status: DC

## 2024-07-24 ENCOUNTER — Inpatient Hospital Stay
Admission: EM | Admit: 2024-07-24 | Discharge: 2024-07-30 | DRG: 920 | Disposition: A | Discharge status: Home / Self Care | Attending: Obstetrics and Gynecology | Admitting: Obstetrics and Gynecology

## 2024-07-24 ENCOUNTER — Other Ambulatory Visit: Payer: Self-pay

## 2024-07-24 ENCOUNTER — Encounter: Payer: Self-pay | Admitting: Intensive Care

## 2024-07-24 ENCOUNTER — Other Ambulatory Visit (INDEPENDENT_AMBULATORY_CARE_PROVIDER_SITE_OTHER): Payer: Self-pay | Admitting: Nurse Practitioner

## 2024-07-24 ENCOUNTER — Ambulatory Visit (INDEPENDENT_AMBULATORY_CARE_PROVIDER_SITE_OTHER): Payer: Self-pay | Admitting: Nurse Practitioner

## 2024-07-24 ENCOUNTER — Telehealth (INDEPENDENT_AMBULATORY_CARE_PROVIDER_SITE_OTHER): Payer: Self-pay

## 2024-07-24 ENCOUNTER — Emergency Department

## 2024-07-24 DIAGNOSIS — K760 Fatty (change of) liver, not elsewhere classified: Secondary | ICD-10-CM | POA: Diagnosis present

## 2024-07-24 DIAGNOSIS — T8140XA Infection following a procedure, unspecified, initial encounter: Secondary | ICD-10-CM | POA: Diagnosis not present

## 2024-07-24 DIAGNOSIS — I2510 Atherosclerotic heart disease of native coronary artery without angina pectoris: Secondary | ICD-10-CM | POA: Diagnosis present

## 2024-07-24 DIAGNOSIS — I10 Essential (primary) hypertension: Secondary | ICD-10-CM | POA: Diagnosis present

## 2024-07-24 DIAGNOSIS — G2581 Restless legs syndrome: Secondary | ICD-10-CM | POA: Diagnosis present

## 2024-07-24 DIAGNOSIS — T148XXA Other injury of unspecified body region, initial encounter: Secondary | ICD-10-CM | POA: Diagnosis present

## 2024-07-24 DIAGNOSIS — Z888 Allergy status to other drugs, medicaments and biological substances status: Secondary | ICD-10-CM

## 2024-07-24 DIAGNOSIS — N179 Acute kidney failure, unspecified: Secondary | ICD-10-CM | POA: Diagnosis present

## 2024-07-24 DIAGNOSIS — T8141XA Infection following a procedure, superficial incisional surgical site, initial encounter: Secondary | ICD-10-CM | POA: Diagnosis present

## 2024-07-24 DIAGNOSIS — T361X5A Adverse effect of cephalosporins and other beta-lactam antibiotics, initial encounter: Secondary | ICD-10-CM | POA: Diagnosis present

## 2024-07-24 DIAGNOSIS — Z882 Allergy status to sulfonamides status: Secondary | ICD-10-CM

## 2024-07-24 DIAGNOSIS — L089 Local infection of the skin and subcutaneous tissue, unspecified: Secondary | ICD-10-CM | POA: Diagnosis not present

## 2024-07-24 DIAGNOSIS — E785 Hyperlipidemia, unspecified: Secondary | ICD-10-CM | POA: Diagnosis present

## 2024-07-24 DIAGNOSIS — Z79899 Other long term (current) drug therapy: Secondary | ICD-10-CM

## 2024-07-24 DIAGNOSIS — Z23 Encounter for immunization: Secondary | ICD-10-CM | POA: Diagnosis present

## 2024-07-24 DIAGNOSIS — K219 Gastro-esophageal reflux disease without esophagitis: Secondary | ICD-10-CM | POA: Diagnosis present

## 2024-07-24 DIAGNOSIS — L7634 Postprocedural seroma of skin and subcutaneous tissue following other procedure: Principal | ICD-10-CM | POA: Diagnosis present

## 2024-07-24 DIAGNOSIS — L03116 Cellulitis of left lower limb: Secondary | ICD-10-CM | POA: Diagnosis present

## 2024-07-24 DIAGNOSIS — I739 Peripheral vascular disease, unspecified: Secondary | ICD-10-CM | POA: Diagnosis present

## 2024-07-24 DIAGNOSIS — F411 Generalized anxiety disorder: Secondary | ICD-10-CM | POA: Diagnosis present

## 2024-07-24 DIAGNOSIS — B9561 Methicillin susceptible Staphylococcus aureus infection as the cause of diseases classified elsewhere: Secondary | ICD-10-CM | POA: Diagnosis present

## 2024-07-24 DIAGNOSIS — I998 Other disorder of circulatory system: Secondary | ICD-10-CM | POA: Diagnosis present

## 2024-07-24 DIAGNOSIS — F17290 Nicotine dependence, other tobacco product, uncomplicated: Secondary | ICD-10-CM | POA: Diagnosis present

## 2024-07-24 DIAGNOSIS — Z8249 Family history of ischemic heart disease and other diseases of the circulatory system: Secondary | ICD-10-CM | POA: Diagnosis not present

## 2024-07-24 DIAGNOSIS — Z87892 Personal history of anaphylaxis: Secondary | ICD-10-CM

## 2024-07-24 DIAGNOSIS — L270 Generalized skin eruption due to drugs and medicaments taken internally: Secondary | ICD-10-CM | POA: Diagnosis present

## 2024-07-24 DIAGNOSIS — Z95828 Presence of other vascular implants and grafts: Secondary | ICD-10-CM

## 2024-07-24 DIAGNOSIS — D72829 Elevated white blood cell count, unspecified: Secondary | ICD-10-CM | POA: Diagnosis not present

## 2024-07-24 DIAGNOSIS — Y838 Other surgical procedures as the cause of abnormal reaction of the patient, or of later complication, without mention of misadventure at the time of the procedure: Secondary | ICD-10-CM | POA: Diagnosis present

## 2024-07-24 DIAGNOSIS — Z7901 Long term (current) use of anticoagulants: Secondary | ICD-10-CM

## 2024-07-24 DIAGNOSIS — Z7982 Long term (current) use of aspirin: Secondary | ICD-10-CM | POA: Diagnosis not present

## 2024-07-24 DIAGNOSIS — A4901 Methicillin susceptible Staphylococcus aureus infection, unspecified site: Secondary | ICD-10-CM

## 2024-07-24 LAB — COMPREHENSIVE METABOLIC PANEL WITH GFR
ALT: 45 U/L — ABNORMAL HIGH (ref 0–44)
AST: 46 U/L — ABNORMAL HIGH (ref 15–41)
Albumin: 3.8 g/dL (ref 3.5–5.0)
Alkaline Phosphatase: 259 U/L — ABNORMAL HIGH (ref 38–126)
Anion gap: 14 (ref 5–15)
BUN: 27 mg/dL — ABNORMAL HIGH (ref 8–23)
CO2: 25 mmol/L (ref 22–32)
Calcium: 9.6 mg/dL (ref 8.9–10.3)
Chloride: 97 mmol/L — ABNORMAL LOW (ref 98–111)
Creatinine, Ser: 1.5 mg/dL — ABNORMAL HIGH (ref 0.44–1.00)
GFR, Estimated: 35 mL/min — ABNORMAL LOW
Glucose, Bld: 117 mg/dL — ABNORMAL HIGH (ref 70–99)
Potassium: 4.7 mmol/L (ref 3.5–5.1)
Sodium: 135 mmol/L (ref 135–145)
Total Bilirubin: 0.7 mg/dL (ref 0.0–1.2)
Total Protein: 7.4 g/dL (ref 6.5–8.1)

## 2024-07-24 LAB — AEROBIC CULTURE

## 2024-07-24 LAB — CBC WITH DIFFERENTIAL/PLATELET
Abs Immature Granulocytes: 0.2 K/uL — ABNORMAL HIGH (ref 0.00–0.07)
Basophils Absolute: 0 K/uL (ref 0.0–0.1)
Basophils Relative: 0 %
Eosinophils Absolute: 0.1 K/uL (ref 0.0–0.5)
Eosinophils Relative: 0 %
HCT: 35.5 % — ABNORMAL LOW (ref 36.0–46.0)
Hemoglobin: 11.4 g/dL — ABNORMAL LOW (ref 12.0–15.0)
Immature Granulocytes: 1 %
Lymphocytes Relative: 8 %
Lymphs Abs: 1.4 K/uL (ref 0.7–4.0)
MCH: 29.1 pg (ref 26.0–34.0)
MCHC: 32.1 g/dL (ref 30.0–36.0)
MCV: 90.6 fL (ref 80.0–100.0)
Monocytes Absolute: 1 K/uL (ref 0.1–1.0)
Monocytes Relative: 6 %
Neutro Abs: 14.9 K/uL — ABNORMAL HIGH (ref 1.7–7.7)
Neutrophils Relative %: 85 %
Platelets: 269 K/uL (ref 150–400)
RBC: 3.92 MIL/uL (ref 3.87–5.11)
RDW: 14 % (ref 11.5–15.5)
WBC: 17.6 K/uL — ABNORMAL HIGH (ref 4.0–10.5)
nRBC: 0 % (ref 0.0–0.2)

## 2024-07-24 LAB — LACTIC ACID, PLASMA
Lactic Acid, Venous: 1.4 mmol/L (ref 0.5–1.9)
Lactic Acid, Venous: 1.7 mmol/L (ref 0.5–1.9)

## 2024-07-24 MED ORDER — ONDANSETRON HCL 4 MG/2ML IJ SOLN: 4.0000 mg | Freq: Four times a day (QID) | INTRAMUSCULAR | Status: DC | PRN

## 2024-07-24 MED ORDER — ONDANSETRON HCL 4 MG PO TABS: 4.0000 mg | ORAL_TABLET | Freq: Four times a day (QID) | ORAL | Status: DC | PRN

## 2024-07-24 MED ORDER — CLINDAMYCIN HCL 300 MG PO CAPS: 300.0000 mg | ORAL_CAPSULE | Freq: Three times a day (TID) | ORAL | 0 refills | Status: DC

## 2024-07-24 MED ORDER — GABAPENTIN 300 MG PO CAPS
300.0000 mg | ORAL_CAPSULE | Freq: Every day | ORAL | Status: DC
  Administered 2024-07-24 – 2024-07-29 (×6): 300 mg via ORAL
  Filled 2024-07-24 (×7): qty 1

## 2024-07-24 MED ORDER — HYDRALAZINE HCL 20 MG/ML IJ SOLN: 10.0000 mg | Freq: Four times a day (QID) | INTRAMUSCULAR | Status: DC | PRN

## 2024-07-24 MED ORDER — OXYCODONE HCL 5 MG PO TABS: 5.0000 mg | ORAL_TABLET | ORAL | Status: DC | PRN

## 2024-07-24 MED ORDER — ACETAMINOPHEN 325 MG PO TABS
650.0000 mg | ORAL_TABLET | Freq: Four times a day (QID) | ORAL | Status: DC | PRN
  Administered 2024-07-28: 650 mg via ORAL
  Filled 2024-07-24: qty 2

## 2024-07-24 MED ORDER — ASPIRIN 81 MG PO TBEC
81.0000 mg | DELAYED_RELEASE_TABLET | Freq: Every day | ORAL | Status: DC
  Administered 2024-07-24 – 2024-07-30 (×7): 81 mg via ORAL
  Filled 2024-07-24 (×7): qty 1

## 2024-07-24 MED ORDER — ATENOLOL 25 MG PO TABS
50.0000 mg | ORAL_TABLET | Freq: Every day | ORAL | Status: DC
  Administered 2024-07-25 – 2024-07-26 (×2): 50 mg via ORAL
  Filled 2024-07-24 (×2): qty 2

## 2024-07-24 MED ORDER — VANCOMYCIN VARIABLE DOSE PER UNSTABLE RENAL FUNCTION (PHARMACIST DOSING): Status: DC

## 2024-07-24 MED ORDER — ACETAMINOPHEN 650 MG RE SUPP: 650.0000 mg | Freq: Four times a day (QID) | RECTAL | Status: DC | PRN

## 2024-07-24 MED ORDER — FENTANYL CITRATE (PF) 50 MCG/ML IJ SOSY: 25.0000 ug | PREFILLED_SYRINGE | INTRAMUSCULAR | Status: DC | PRN

## 2024-07-24 MED ORDER — VANCOMYCIN HCL IN DEXTROSE 1-5 GM/200ML-% IV SOLN
1000.0000 mg | Freq: Once | INTRAVENOUS | Status: AC
  Administered 2024-07-24: 1000 mg via INTRAVENOUS
  Filled 2024-07-24: qty 200

## 2024-07-24 MED ORDER — ENOXAPARIN SODIUM 30 MG/0.3ML IJ SOSY
30.0000 mg | PREFILLED_SYRINGE | Freq: Every day | INTRAMUSCULAR | Status: DC
  Administered 2024-07-24: 30 mg via SUBCUTANEOUS
  Filled 2024-07-24: qty 0.3

## 2024-07-24 MED ORDER — POLYETHYLENE GLYCOL 3350 17 G PO PACK: 17.0000 g | PACK | Freq: Every day | ORAL | Status: DC | PRN

## 2024-07-24 MED ORDER — LACTATED RINGERS IV BOLUS
1000.0000 mL | Freq: Once | INTRAVENOUS | Status: AC
  Administered 2024-07-24: 1000 mL via INTRAVENOUS

## 2024-07-24 MED ORDER — SODIUM CHLORIDE 0.9 % IV SOLN: INTRAVENOUS | Status: AC

## 2024-07-24 NOTE — Telephone Encounter (Signed)
 Patient daughter call on the status from the lab results. Patient daughter states that mother can barely walk, has redness around her eyes, and not able to eat or drink. Patient daughter called the EMS on yesterday but did not have to go to the hospital. Patient daughter did informed there puss in the incision area per EMT. Please Advise

## 2024-07-24 NOTE — Consult Note (Signed)
 Pharmacy Antibiotic Note  Megan Irwin is a 79 y.o. female with a past medical history notable for  HTN, HLD, GERD, PAD s/p s/p critical limb ischemia LLE, s/p  Left common femoral and superficial femoral artery endarterectomies and patch angioplasty on 06/27/2024.   Patient was admitted on 07/24/2024 with concerns for SOB and bilateral leg weakness in the setting of recovery from left leg vascular procedure recovery. Prior to admission, patient observed redness and increased pain around left leg vascular procedure wound site. Three days prior to admission, wound culture was taken PCP office, which came back positive for MSSA. Subsequently was prescribed doxycycline  outpatient. Now patient concerned for left groin wound infection. Pharmacy has been consulted for vancomycin  dosing.  1/8 Renal - Scr 1.5 (baseline ~0.9-1) WBC 17.6, lac <2, afebrile 1/5 wound cx: MSSA (R doxy, pcn) 1/8 blood cx: in process Pending echo to r/o endocarditis  Vancomycin  1 g IV loading dose already given at 1606 Dose is appropriate  Plan: Vancomycin  AKI - renal function not at baseline, so will plan to dose by levels for now Reassess maintenance vancomycin  dosing plan with 1/9 AM labs CMP already ordered for 1/9 AM Continue to monitor renal function and cultures for adjustments  Height: 5' 3 (160 cm) Weight: 53.1 kg (117 lb) IBW/kg (Calculated) : 52.4  Temp (24hrs), Avg:97.4 F (36.3 C), Min:97.4 F (36.3 C), Max:97.4 F (36.3 C)  Recent Labs  Lab 07/24/24 1211 07/24/24 1609  WBC 17.6*  --   CREATININE 1.50*  --   LATICACIDVEN 1.4 1.7    Estimated Creatinine Clearance: 24.7 mL/min (A) (by C-G formula based on SCr of 1.5 mg/dL (H)).    Allergies[1]  Thank you for allowing pharmacy to be a part of this patients care.  Megan Irwin Megan Irwin 07/24/2024 6:15 PM     [1]  Allergies Allergen Reactions   Calcium  Channel Blockers Shortness Of Breath, Anxiety, Palpitations and Other (See Comments)     Cognitive effects also--difficulty thinking   Hydrochlorothiazide Shortness Of Breath   Sodium Metabisulfite Anaphylaxis    Sulfite--in foods   Sulfites Swelling    Can't breathe   Telmisartan-Hctz Shortness Of Breath    Chest pain & fatigue    Cardizem [Diltiazem Hcl] Other (See Comments)    Difficulty eating, sleeping, and concentrating Heart goes crazy    Sulfa Antibiotics Hives   Acyclovir And Related    Apixaban  Nausea Only   Plavix  [Clopidogrel ]    Lexapro [Escitalopram Oxalate] Other (See Comments)    Gi upset

## 2024-07-24 NOTE — Telephone Encounter (Signed)
 Patient daughter was notified with medical recommendations and verbalized that she will taking her mother ED for further evaluation.

## 2024-07-24 NOTE — ED Triage Notes (Signed)
 Wound in groin area came back positive for staph after being swabbed on Monday. Has been taking doxycyline. Reports area has pus. Recent procedure in December 2025 through groin area due to blood clot in leg  Patient c/o not feeling well and not eating/drinking much

## 2024-07-24 NOTE — Progress Notes (Signed)
 Patient daughter has been notified with medical advice and verbalized understanding

## 2024-07-24 NOTE — Progress Notes (Signed)
 Culture just came back fully.  See if she is still taking tizanidne.  I need to send her a new abx, the doxy is resistant

## 2024-07-24 NOTE — Progress Notes (Signed)
 I have sent in clindamycin  based on her culture

## 2024-07-24 NOTE — Telephone Encounter (Signed)
 Ask if she started the xarelto .  She doesn't seem to react well to that. Ask if they can send a pic because it may not be pus, but fibrinous exudate, which she had.  Her culture isn't fully back yet he shows prelim MRSA, which doxy should help.  I would like her to go the ED however based on her being weak and unable to eat or drink

## 2024-07-24 NOTE — H&P (Addendum)
 "  History and Physical    Megan Irwin FMW:[id number] DOB: 1946-05-18 DOA: 07/24/2024  DOS: the patient was seen and examined on 07/24/2024  PCP: Delfina Pao, MD   Patient coming from: Home  I have personally briefly reviewed patient's old medical records in [facility]  Chief Complaint: Left groin wound infection with a staph   HPI: Megan Irwin is a pleasant 79 y.o. female with medical history significant for HTN, HLD, GERD, PAD s/p s/p critical limb ischemia LLE, s/p  Left common femoral and superficial femoral artery endarterectomies and patch angioplasty  on 06/27/2024 who came in to ED for left groin nonhealing wound after vascular procedure.  Patient stated that she noticed some redness and worsening pain in her incision site.  She went to primary care doctor office 3 days ago where wound culture was done and was started on antibiotic doxycycline .  Wound culture came back positive for Staph aureus and resistant to oral antibiotics.  Patient was advised to go to the emergency room for evaluation.  Patient denied any fever, chills, nausea, vomiting, diarrhea, chest pain, palpitations.  However, patient stated that she had decreased oral intake and was not able to eat and drink well.  ED Course: Upon arrival to the ED, patient is found to have left groin wound with yellowish drainage.  Mild erythema around the wound.  Patient was started on vancomycin  and sent 2 sets of blood cultures.  Hospitalist service was consulted for evaluation for admission for left groin infection with failed outpatient treatment with doxycycline  growing Staph aureus.  Review of Systems:  ROS  All other systems negative except as noted in the HPI.  Past Medical History:  Diagnosis Date   Anxiety    Cholelithiasis 09/2017   Dysrhythmia 2019   skipping a beat with palpatations   GERD (gastroesophageal reflux disease)    food gets stuck like an esophagitis   Hypertension    Insomnia    Migraines     magnesium  helps   NAFL (nonalcoholic fatty liver)    Restless leg syndrome    uses heating pad to resolve   Sciatica 2019   pt is in a good place after getting an SI joint injection    Past Surgical History:  Procedure Laterality Date   APPLICATION OF CELL SAVER Left 06/27/2024   Procedure: APPLICATION OF CELL SAVER;  Surgeon: Marea Selinda GORMAN, MD;  Location: [facility];  Service: Vascular;  Laterality: Left;   CHOLECYSTECTOMY N/A 10/03/2017   Procedure: LAPAROSCOPIC CHOLECYSTECTOMY;  Surgeon: Rodolph Romano, MD;  Location: [facility];  Service: General;  Laterality: N/A;   COLONOSCOPY     ENDARTERECTOMY FEMORAL Left 06/27/2024   Procedure: Left common femoral and superficial femoral artery endarterectomies and patch angioplasty;  Surgeon: Marea Selinda GORMAN, MD;  Location: [facility];  Service: Vascular;  Laterality: Left;   INSERT / REPLACE / REMOVE PACEMAKER     LOWER EXTREMITY ANGIOGRAPHY Left 02/25/2024   Procedure: Lower Extremity Angiography;  Surgeon: Marea Selinda GORMAN, MD;  Location: [facility];  Service: Cardiovascular;  Laterality: Left;   LOWER EXTREMITY ANGIOGRAPHY Right 03/03/2024   Procedure: Lower Extremity Angiography;  Surgeon: Marea Selinda GORMAN, MD;  Location: [facility];  Service: Cardiovascular;  Laterality: Right;   LOWER EXTREMITY ANGIOGRAPHY Left 06/26/2024   Procedure: Lower Extremity Angiography;  Surgeon: Marea Selinda GORMAN, MD;  Location: [facility];  Service: Cardiovascular;  Laterality: Left;   LOWER EXTREMITY ANGIOGRAPHY Left  06/27/2024   Procedure: Lower Extremity Angiography;  Surgeon: Marea Selinda RAMAN, MD;  Location: [facility];  Service: Cardiovascular;  Laterality: Left;   LOWER EXTREMITY INTERVENTION Left 06/26/2024   Procedure: LOWER EXTREMITY INTERVENTION;  Surgeon: Marea Selinda RAMAN, MD;  Location: [facility];  Service: Cardiovascular;  Laterality: Left;   TONSILLECTOMY     TUBAL LIGATION       reports that she quit smoking  about 4 years ago. Her smoking use included e-cigarettes and cigarettes. She smoked an average of 0.5 packs per day. She has never used smokeless tobacco. She reports that she does not drink alcohol and does not use drugs.  Allergies[1]  Family History  Problem Relation Age of Onset   Pneumonia Mother    Hypertension Mother    Heart attack Father 82    Prior to Admission medications  Medication Sig Start Date End Date Taking? Authorizing Provider  aspirin  EC 81 MG tablet Take 1 tablet (81 mg total) by mouth daily. Swallow whole. 06/30/24  Yes Pace, Brien R, NP  atenolol  (TENORMIN ) 100 MG tablet Take 100 mg by mouth daily. In the morning   Yes [provider]  Calcium  Magnesium  Zinc  333-133-5 MG TABS Take 1 tablet by mouth daily.   Yes [provider]  chlorthalidone  (HYGROTON ) 25 MG tablet Take 25 mg by mouth daily. 08/05/19  Yes [provider]  clindamycin  (CLEOCIN ) 300 MG capsule Take 1 capsule (300 mg total) by mouth 3 (three) times daily for 10 days. 07/24/24 08/03/24 Yes Brown, Fallon E, NP  doxycycline  (VIBRAMYCIN ) 100 MG capsule Take 1 capsule (100 mg total) by mouth 2 (two) times daily. 07/21/24  Yes Brown, Fallon E, NP  gabapentin  (NEURONTIN ) 300 MG capsule Take 300 mg by mouth daily.   Yes [provider]  tizanidine  (ZANAFLEX ) 2 MG capsule Take 2 mg by mouth at bedtime as needed for muscle spasms. Take a 1/2 tablet every night   Yes [provider]  traMADol  (ULTRAM ) 50 MG tablet Take 1 tablet (50 mg total) by mouth every 6 (six) hours as needed. 06/25/24  Yes Brown, Fallon E, NP  valsartan (DIOVAN) 160 MG tablet Take 160 mg by mouth daily.   Yes [provider]  atorvastatin  (LIPITOR) 20 MG tablet Take 1 tablet (20 mg total) by mouth daily at 6 PM. Patient only takes half a tablet most times Patient not taking: Reported on 07/24/2024 02/25/24   Marea Selinda RAMAN, MD  B Complex-C (SUPER B COMPLEX PO) Take 1 capsule by mouth daily.     [provider]  rivaroxaban  (XARELTO ) 2.5 MG TABS tablet Take 1 tablet (2.5 mg total) by mouth 2 (two) times daily. Patient not taking: Reported on 07/24/2024 07/21/24   Delores Orvin BRAVO, NP    Physical Exam: Vitals:   07/24/24 1203 07/24/24 1208 07/24/24 1501  BP: 133/68  124/66  Pulse: 82  74  Resp: 16  16  Temp: (!) 97.4 F (36.3 C)    TempSrc: Oral    SpO2: 96%  100%  Weight:  53.1 kg   Height:  5' 3 (1.6 m)     Physical Exam   Constitutional: Alert, awake, calm, comfortable HEENT: Neck supple Respiratory: Clear to auscultation B/L, no wheezing, no rales.  Cardiovascular: Regular rate and rhythm, no murmurs / rubs / gallops. No extremity edema. 2+ pedal pulses. No carotid bruits.  Abdomen: Soft, no tenderness, Bowel sounds positive.  Musculoskeletal: no clubbing / cyanosis.  Good ROM, no contractures. Normal muscle tone.  Skin: Left groin has wound which appears to be infected, erythematous, warmth and mild tender to the incision site.  Small amount of pus is coming. Neurologic: CN 2-12 grossly intact. Sensation intact, No focal deficit identified Psychiatric: Alert and oriented x 3. Normal mood.    Labs on Admission: I have personally reviewed following labs and imaging studies  CBC: Recent Labs  Lab 07/24/24 1211  WBC 17.6*  NEUTROABS 14.9*  HGB 11.4*  HCT 35.5*  MCV 90.6  PLT 269   Basic Metabolic Panel: Recent Labs  Lab 07/24/24 1211  NA 135  K 4.7  CL 97*  CO2 25  GLUCOSE 117*  BUN 27*  CREATININE 1.50*  CALCIUM  9.6   GFR: Estimated Creatinine Clearance: 24.7 mL/min (A) (by C-G formula based on SCr of 1.5 mg/dL (H)). Liver Function Tests: Recent Labs  Lab 07/24/24 1211  AST 46*  ALT 45*  ALKPHOS 259*  BILITOT 0.7  PROT 7.4  ALBUMIN 3.8   No results for input(s): LIPASE, AMYLASE in the last 168 hours. No results for input(s): AMMONIA in the last 168 hours. Coagulation Profile: No results for input(s): INR, PROTIME in the  last 168 hours. Cardiac Enzymes: No results for input(s): CKTOTAL, CKMB, CKMBINDEX, TROPONINI, TROPONINIHS in the last 168 hours. BNP (last 3 results) No results for input(s): BNP in the last 8760 hours. HbA1C: No results for input(s): HGBA1C in the last 72 hours. CBG: No results for input(s): GLUCAP in the last 168 hours. Lipid Profile: No results for input(s): CHOL, HDL, LDLCALC, TRIG, CHOLHDL, LDLDIRECT in the last 72 hours. Thyroid Function Tests: No results for input(s): TSH, T4TOTAL, FREET4, T3FREE, THYROIDAB in the last 72 hours. Anemia Panel: No results for input(s): VITAMINB12, FOLATE, FERRITIN, TIBC, IRON, RETICCTPCT in the last 72 hours. Urine analysis:    Component Value Date/Time   COLORURINE STRAW (A) 08/30/2017 1644   APPEARANCEUR CLEAR 08/30/2017 1644   LABSPEC <1.005 (L) 08/30/2017 1644   PHURINE 6.0 08/30/2017 1644   GLUCOSEU NEGATIVE 08/30/2017 1644   HGBUR TRACE (A) 08/30/2017 1644   BILIRUBINUR NEGATIVE 08/30/2017 1644   KETONESUR NEGATIVE 08/30/2017 1644   PROTEINUR NEGATIVE 08/30/2017 1644   NITRITE NEGATIVE 08/30/2017 1644   LEUKOCYTESUR NEGATIVE 08/30/2017 1644    Radiological Exams on Admission: I have personally reviewed images DG Chest 2 View Result Date: 07/24/2024 CLINICAL DATA:  Weakness for several days. EXAM: CHEST - 2 VIEW COMPARISON:  None Available. FINDINGS: The heart size and mediastinal contours are within normal limits. Very mild areas of scarring, atelectasis and/or infiltrate are seen along the periphery of the right upper lobe and left lung base. No pleural effusion or pneumothorax is identified. Multilevel degenerative changes seen throughout the thoracic spine and visualized portion of the upper lumbar spine. IMPRESSION: Very mild right upper lobe and left basilar scarring, atelectasis and/or infiltrate. Electronically Signed   By: Suzen Dials M.D.   On: 07/24/2024 12:53    EKG:  N/A    Assessment/Plan Principal Problem:   Wound infection Active Problems:   Dyslipidemia   Essential hypertension   Generalized anxiety disorder   Restless legs syndrome (RLS)   Ischemic leg    Assessment and Plan: 79 year old female with history of HTN, HLD, generalized anxiety disorder, restless leg syndrome, left lower extremity ischemia s/p Left common femoral and superficial femoral artery endarterectomies and patch angioplasty on 06/27/2024 who came into ED complaining of left groin wound infection.  1.  Left groin wound infection with Staph aureus - Patient was given oral doxycycline  at home without any improvement. - Patient has now failed outpatient treatment. - She needs IV antibiotic vancomycin  for staph. - Patient was started on vancomycin . - Blood cultures have been sent. - Will continue antibiotics, follow-up blood cultures, pain medications, wound care, vascular consult. - Will get 2D echo to rule out vegetations  2.  HTN - We will continue her atenolol  100 mg daily at home. - Blood pressure appears to be stable. - Will also add for as needed hydralazine  for systolic blood pressure more than 160 - Continue to monitor blood pressure.  3.  Hyperlipidemia - Patient stated that she takes medication but I see that in the system she has not taken atorvastatin . - She may need to resume that atorvastatin  once confirmed.  4.  PAD - Continue aspirin   5.  AKI - Will give her gentle hydration at 75 cc/h until tomorrow morning - Check BMP.    DVT prophylaxis: Lovenox  Code Status: Full Code Family Communication: None Disposition Plan: Home Consults called: Vascular/wound Admission status: Inpatient, Telemetry bed   Nena Rebel, MD [facility] 07/24/2024, 6:08 PM        [1]  Allergies Allergen Reactions   Calcium  Channel Blockers Shortness Of Breath, Anxiety, Palpitations and Other (See Comments)    Cognitive effects also--difficulty thinking    Hydrochlorothiazide Shortness Of Breath   Sodium Metabisulfite Anaphylaxis    Sulfite--in foods   Sulfites Swelling    Can't breathe   Telmisartan-Hctz Shortness Of Breath    Chest pain & fatigue    Cardizem [Diltiazem Hcl] Other (See Comments)    Difficulty eating, sleeping, and concentrating Heart goes crazy    Sulfa Antibiotics Hives   Acyclovir And Related    Apixaban  Nausea Only   Plavix  [Clopidogrel ]    Lexapro [Escitalopram Oxalate] Other (See Comments)    Gi upset   "

## 2024-07-24 NOTE — ED Provider Notes (Signed)
 "  [facility] Provider Note    Event Date/Time   First MD Initiated Contact with Patient 07/24/24 1502     (approximate)   History   Wound Check   HPI  Megan Irwin is a 79 y.o. female who presents to the emergency department today with concerns for left leg wound infection and weakness.  The patient had a left lower leg thrombectomy performed roughly 1 month ago.  She states that since then she has not been feeling great however over the past week she has been feeling worse.  The patient also noticed some redness and worsening pain to the incision site.  Saw her primary care doctor 3 days ago who took a wound culture and started patient on oral antibiotics.  Wound culture came back positive for staph and resistant to the oral antibiotic.  Patient has had decreased oral intake. Denies fevers but has had chills.    Physical Exam   Triage Vital Signs: ED Triage Vitals  Encounter Vitals Group     BP 07/24/24 1203 133/68     Girls Systolic BP Percentile --      Girls Diastolic BP Percentile --      Boys Systolic BP Percentile --      Boys Diastolic BP Percentile --      Pulse Rate 07/24/24 1203 82     Resp 07/24/24 1203 16     Temp 07/24/24 1203 (!) 97.4 F (36.3 C)     Temp Source 07/24/24 1203 Oral     SpO2 07/24/24 1203 96 %     Weight 07/24/24 1208 117 lb (53.1 kg)     Height 07/24/24 1208 5' 3 (1.6 m)     Head Circumference --      Peak Flow --      Pain Score 07/24/24 1208 4     Pain Loc --      Pain Education --      Exclude from Growth Chart --     Most recent vital signs: Vitals:   07/24/24 1203 07/24/24 1501  BP: 133/68 124/66  Pulse: 82 74  Resp: 16 16  Temp: (!) 97.4 F (36.3 C)   SpO2: 96% 100%   General: Awake, alert, oriented. CV:  Good peripheral perfusion. Regular rate and rhythm. Resp:  Normal effort. Lungs clear. Abd:  No distention.  Other:  Left groin with erythema, warmth and tenderness to the incision site.   Small amount of pus at the site.   ED Results / Procedures / Treatments   Labs (all labs ordered are listed, but only abnormal results are displayed) Labs Reviewed  COMPREHENSIVE METABOLIC PANEL WITH GFR - Abnormal; Notable for the following components:      Result Value   Chloride 97 (*)    Glucose, Bld 117 (*)    BUN 27 (*)    Creatinine, Ser 1.50 (*)    AST 46 (*)    ALT 45 (*)    Alkaline Phosphatase 259 (*)    GFR, Estimated 35 (*)    All other components within normal limits  CBC WITH DIFFERENTIAL/PLATELET - Abnormal; Notable for the following components:   WBC 17.6 (*)    Hemoglobin 11.4 (*)    HCT 35.5 (*)    Neutro Abs 14.9 (*)    Abs Immature Granulocytes 0.20 (*)    All other components within normal limits  LACTIC ACID, PLASMA  LACTIC ACID, PLASMA  URINALYSIS, W/ REFLEX TO  CULTURE (INFECTION SUSPECTED)     EKG  None   RADIOLOGY I independently interpreted and visualized the CXR. My interpretation: No pneumonia Radiology interpretation:  IMPRESSION:  Very mild right upper lobe and left basilar scarring, atelectasis  and/or infiltrate.     PROCEDURES:  Critical Care performed: No    MEDICATIONS ORDERED IN ED: Medications - No data to display   IMPRESSION / MDM / ASSESSMENT AND PLAN / ED COURSE  I reviewed the triage vital signs and the nursing notes.                              Differential diagnosis includes, but is not limited to, wound infection, bacteremia  Patient's presentation is most consistent with acute presentation with potential threat to life or bodily function.   Patient presented to the emergency department today with concerns for infection to her left groin and incision site as well as weakness.  Patient is afebrile here.  Blood work does show leukocytosis and AKI.  On exam the patient's wound does appear grossly infected.  Will plan on starting IV antibiotics and IV fluids.  Discussed the plan and plan of admission with  patient.    FINAL CLINICAL IMPRESSION(S) / ED DIAGNOSES   Final diagnoses:  Postoperative infection, unspecified type, initial encounter  AKI (acute kidney injury)     Note:  This document was prepared using Dragon voice recognition software and may include unintentional dictation errors.    Floy Roberts, MD 07/24/24 [phone]  "

## 2024-07-25 ENCOUNTER — Inpatient Hospital Stay: Admit: 2024-07-25 | Discharge: 2024-07-25 | Disposition: A | Attending: Internal Medicine

## 2024-07-25 DIAGNOSIS — N179 Acute kidney failure, unspecified: Secondary | ICD-10-CM | POA: Diagnosis not present

## 2024-07-25 DIAGNOSIS — A4901 Methicillin susceptible Staphylococcus aureus infection, unspecified site: Secondary | ICD-10-CM

## 2024-07-25 DIAGNOSIS — T8140XA Infection following a procedure, unspecified, initial encounter: Secondary | ICD-10-CM | POA: Diagnosis not present

## 2024-07-25 DIAGNOSIS — L089 Local infection of the skin and subcutaneous tissue, unspecified: Secondary | ICD-10-CM | POA: Diagnosis not present

## 2024-07-25 DIAGNOSIS — I739 Peripheral vascular disease, unspecified: Secondary | ICD-10-CM | POA: Diagnosis not present

## 2024-07-25 DIAGNOSIS — B9561 Methicillin susceptible Staphylococcus aureus infection as the cause of diseases classified elsewhere: Secondary | ICD-10-CM

## 2024-07-25 DIAGNOSIS — D72829 Elevated white blood cell count, unspecified: Secondary | ICD-10-CM

## 2024-07-25 DIAGNOSIS — T148XXA Other injury of unspecified body region, initial encounter: Secondary | ICD-10-CM | POA: Diagnosis not present

## 2024-07-25 LAB — CBC
HCT: 30.2 % — ABNORMAL LOW (ref 36.0–46.0)
Hemoglobin: 9.7 g/dL — ABNORMAL LOW (ref 12.0–15.0)
MCH: 29.2 pg (ref 26.0–34.0)
MCHC: 32.1 g/dL (ref 30.0–36.0)
MCV: 91 fL (ref 80.0–100.0)
Platelets: 235 K/uL (ref 150–400)
RBC: 3.32 MIL/uL — ABNORMAL LOW (ref 3.87–5.11)
RDW: 13.9 % (ref 11.5–15.5)
WBC: 11.5 K/uL — ABNORMAL HIGH (ref 4.0–10.5)
nRBC: 0 % (ref 0.0–0.2)

## 2024-07-25 LAB — COMPREHENSIVE METABOLIC PANEL WITH GFR
ALT: 41 U/L (ref 0–44)
AST: 40 U/L (ref 15–41)
Albumin: 3.1 g/dL — ABNORMAL LOW (ref 3.5–5.0)
Alkaline Phosphatase: 225 U/L — ABNORMAL HIGH (ref 38–126)
Anion gap: 11 (ref 5–15)
BUN: 27 mg/dL — ABNORMAL HIGH (ref 8–23)
CO2: 24 mmol/L (ref 22–32)
Calcium: 9 mg/dL (ref 8.9–10.3)
Chloride: 103 mmol/L (ref 98–111)
Creatinine, Ser: 1.1 mg/dL — ABNORMAL HIGH (ref 0.44–1.00)
GFR, Estimated: 51 mL/min — ABNORMAL LOW
Glucose, Bld: 91 mg/dL (ref 70–99)
Potassium: 3.6 mmol/L (ref 3.5–5.1)
Sodium: 138 mmol/L (ref 135–145)
Total Bilirubin: 0.3 mg/dL (ref 0.0–1.2)
Total Protein: 6 g/dL — ABNORMAL LOW (ref 6.5–8.1)

## 2024-07-25 LAB — ECHOCARDIOGRAM COMPLETE
AR max vel: 2.45 cm2
AV Area VTI: 2.59 cm2
AV Area mean vel: 2.74 cm2
AV Mean grad: 1.5 mmHg
AV Peak grad: 3.3 mmHg
Ao pk vel: 0.91 m/s
Area-P 1/2: 2.93 cm2
Height: 63 in
MV VTI: 1.6 cm2
S' Lateral: 2.79 cm
Weight: 1872 [oz_av]

## 2024-07-25 LAB — PROTIME-INR
INR: 1 (ref 0.8–1.2)
Prothrombin Time: 14.2 s (ref 11.4–15.2)

## 2024-07-25 MED ORDER — CHLORTHALIDONE 25 MG PO TABS
25.0000 mg | ORAL_TABLET | Freq: Every day | ORAL | Status: DC
  Administered 2024-07-25 – 2024-07-30 (×6): 25 mg via ORAL
  Filled 2024-07-25 (×6): qty 1

## 2024-07-25 MED ORDER — COLLAGENASE 250 UNIT/GM EX OINT
TOPICAL_OINTMENT | Freq: Every day | CUTANEOUS | Status: DC
  Filled 2024-07-25: qty 30

## 2024-07-25 MED ORDER — ENOXAPARIN SODIUM 40 MG/0.4ML IJ SOSY
40.0000 mg | PREFILLED_SYRINGE | Freq: Every day | INTRAMUSCULAR | Status: DC
  Administered 2024-07-25 – 2024-07-29 (×4): 40 mg via SUBCUTANEOUS
  Filled 2024-07-25 (×5): qty 0.4

## 2024-07-25 MED ORDER — CEFAZOLIN SODIUM-DEXTROSE 2-4 GM/100ML-% IV SOLN
2.0000 g | Freq: Two times a day (BID) | INTRAVENOUS | Status: DC
  Administered 2024-07-25 – 2024-07-28 (×6): 2 g via INTRAVENOUS
  Filled 2024-07-25 (×6): qty 100

## 2024-07-25 NOTE — Consult Note (Signed)
 " [facility] Vascular Consult Note  MRN : [id number]  Megan Irwin is a 79 y.o. (30-Mar-1946) female who presents with chief complaint of  Chief Complaint  Patient presents with   Wound Check  .   Consulting Physician:Keshab Paudel, MD Reason for consult:Left groin infection  History of Present Illness: Megan Irwin is an 79 year old female who has a previous medical history of hypertension, hyperlipidemia, GERD and critical limb ischemia.  On 06/27/2024, the patient underwent a left common femoral and superficial femoral artery endarterectomy and patch angioplasty.  She was seen in the office on 07/21/2024, there was some erythema present with a small area of dehiscence at the proximal portion.  There was not any notable drainage present but there was notable fibrinous exudate noted within the wound itself.  At that time the wound was cultured and started on doxycycline .  Wound culture reveals that the doxycycline  was resistant to her Staphylococcus aureus.  However when contacting the patient she notes that she was weak and unable to hold water  down with some swelling of the face.  At that point we felt it was prudent for her to go to the emergency room for evaluation.  In evaluation of the wound today in the area it certainly looks much more cellulitic than when it was seen in office.  She currently denies any rest pains or any symptoms of limb ischemia.  Current Facility-Administered Medications  Medication Dose Route Frequency Provider Last Rate Last Admin   0.9 %  sodium chloride  infusion   Intravenous Continuous Paudel, Keshab, MD   Stopped at 07/25/24 1031   acetaminophen  (TYLENOL ) tablet 650 mg  650 mg Oral Q6H PRN Roann Gouty, MD       Or   acetaminophen  (TYLENOL ) suppository 650 mg  650 mg Rectal Q6H PRN Roann Gouty, MD       aspirin  EC tablet 81 mg  81 mg Oral Daily Paudel, Gouty, MD   81 mg at 07/25/24 0913   atenolol  (TENORMIN ) tablet 50 mg  50  mg Oral Daily Paudel, Gouty, MD   50 mg at 07/25/24 0913   chlorthalidone  (HYGROTON ) tablet 25 mg  25 mg Oral Daily Trudy Anthony HERO, MD   25 mg at 07/25/24 1624   collagenase  (SANTYL ) ointment   Topical Daily Trudy Anthony HERO, MD   Given at 07/25/24 1216   enoxaparin  (LOVENOX ) injection 40 mg  40 mg Subcutaneous QHS Trudy Anthony HERO, MD       fentaNYL  (SUBLIMAZE ) injection 25 mcg  25 mcg Intravenous Q2H PRN Roann Gouty, MD       gabapentin  (NEURONTIN ) capsule 300 mg  300 mg Oral Daily Paudel, Gouty, MD   300 mg at 07/25/24 0913   hydrALAZINE  (APRESOLINE ) injection 10 mg  10 mg Intravenous Q6H PRN Roann Gouty, MD       ondansetron  (ZOFRAN ) tablet 4 mg  4 mg Oral Q6H PRN Paudel, Keshab, MD       Or   ondansetron  (ZOFRAN ) injection 4 mg  4 mg Intravenous Q6H PRN Paudel, Gouty, MD       oxyCODONE  (Oxy IR/ROXICODONE ) immediate release tablet 5 mg  5 mg Oral Q4H PRN Paudel, Keshab, MD       polyethylene glycol (MIRALAX  / GLYCOLAX ) packet 17 g  17 g Oral Daily PRN Paudel, Keshab, MD       vancomycin  variable dose per unstable renal function (pharmacist dosing)   Does not apply See admin instructions [place], [place]  JAYSON [facility]        Past Medical History:  Diagnosis Date   Anxiety    Cholelithiasis 09/2017   Dysrhythmia 2019   skipping a beat with palpatations   GERD (gastroesophageal reflux disease)    food gets stuck like an esophagitis   Hypertension    Insomnia    Migraines    magnesium  helps   NAFL (nonalcoholic fatty liver)    Restless leg syndrome    uses heating pad to resolve   Sciatica 2019   pt is in a good place after getting an SI joint injection    Past Surgical History:  Procedure Laterality Date   APPLICATION OF CELL SAVER Left 06/27/2024   Procedure: APPLICATION OF CELL SAVER;  Surgeon: Marea Selinda RAMAN, MD;  Location: [facility];  Service: Vascular;  Laterality: Left;   CHOLECYSTECTOMY N/A 10/03/2017   Procedure: LAPAROSCOPIC CHOLECYSTECTOMY;  Surgeon:  Rodolph Romano, MD;  Location: [facility];  Service: General;  Laterality: N/A;   COLONOSCOPY     ENDARTERECTOMY FEMORAL Left 06/27/2024   Procedure: Left common femoral and superficial femoral artery endarterectomies and patch angioplasty;  Surgeon: Marea Selinda RAMAN, MD;  Location: [facility];  Service: Vascular;  Laterality: Left;   INSERT / REPLACE / REMOVE PACEMAKER     LOWER EXTREMITY ANGIOGRAPHY Left 02/25/2024   Procedure: Lower Extremity Angiography;  Surgeon: Marea Selinda RAMAN, MD;  Location: [facility];  Service: Cardiovascular;  Laterality: Left;   LOWER EXTREMITY ANGIOGRAPHY Right 03/03/2024   Procedure: Lower Extremity Angiography;  Surgeon: Marea Selinda RAMAN, MD;  Location: [facility];  Service: Cardiovascular;  Laterality: Right;   LOWER EXTREMITY ANGIOGRAPHY Left 06/26/2024   Procedure: Lower Extremity Angiography;  Surgeon: Marea Selinda RAMAN, MD;  Location: [facility];  Service: Cardiovascular;  Laterality: Left;   LOWER EXTREMITY ANGIOGRAPHY Left 06/27/2024   Procedure: Lower Extremity Angiography;  Surgeon: Marea Selinda RAMAN, MD;  Location: [facility];  Service: Cardiovascular;  Laterality: Left;   LOWER EXTREMITY INTERVENTION Left 06/26/2024   Procedure: LOWER EXTREMITY INTERVENTION;  Surgeon: Marea Selinda RAMAN, MD;  Location: [facility];  Service: Cardiovascular;  Laterality: Left;   TONSILLECTOMY     TUBAL LIGATION      Social History Social History[1]  Family History Family History  Problem Relation Age of Onset   Pneumonia Mother    Hypertension Mother    Heart attack Father 32    Allergies[2]   REVIEW OF SYSTEMS (Negative unless checked)  Constitutional: [] Weight loss  [] Fever  [] Chills Cardiac: [] Chest pain   [] Chest pressure   [] Palpitations   [] Shortness of breath when laying flat   [] Shortness of breath at rest   [] Shortness of breath with exertion. Vascular:  [] Pain in legs with walking   [] Pain in legs at rest   [] Pain in  legs when laying flat   [] Claudication   [] Pain in feet when walking  [] Pain in feet at rest  [] Pain in feet when laying flat   [] History of DVT   [] Phlebitis   [] Swelling in legs   [] Varicose veins   [] Non-healing ulcers Pulmonary:   [] Uses home oxygen   [] Productive cough   [] Hemoptysis   [] Wheeze  [] COPD   [] Asthma Neurologic:  [] Dizziness  [] Blackouts   [] Seizures   [] History of stroke   [] History of TIA  [] Aphasia   [] Temporary blindness   [] Dysphagia   [] Weakness or numbness in arms   [] Weakness or numbness  in legs Musculoskeletal:  [] Arthritis   [] Joint swelling   [] Joint pain   [] Low back pain Hematologic:  [] Easy bruising  [] Easy bleeding   [] Hypercoagulable state   [] Anemic  [] Hepatitis Gastrointestinal:  [] Blood in stool   [] Vomiting blood  [] Gastroesophageal reflux/heartburn   [] Difficulty swallowing. Genitourinary:  [] Chronic kidney disease   [] Difficult urination  [] Frequent urination  [] Burning with urination   [] Blood in urine Skin:  [] Rashes   [] Ulcers   [x] Wounds Psychological:  [] History of anxiety   []  History of major depression.  Physical Examination  Vitals:   07/24/24 2034 07/25/24 0418 07/25/24 0736 07/25/24 1633  BP: (!) 145/61 128/67 (!) 145/56 (!) 148/64  Pulse: 78 69 73 66  Resp: 17 17 18 17   Temp: 97.9 F (36.6 C) 97.7 F (36.5 C) (!) 97.5 F (36.4 C) 98.2 F (36.8 C)  TempSrc:      SpO2: 100% 97% 94% 100%  Weight:      Height:       Body mass index is 20.73 kg/m. Gen:  WD/WN, NAD Head: [place]/AT, No temporalis wasting. Prominent temp pulse not noted. Ear/Nose/Throat: Hearing grossly intact, nares w/o erythema or drainage, oropharynx w/o Erythema/Exudate Eyes: Sclera non-icteric, conjunctiva clear Neck: Trachea midline.  No JVD.  Pulmonary:  Good air movement, respirations not labored, equal bilaterally.  Cardiac: RRR, normal S1, S2. Vascular:  Vessel Right Left  PT Palpable Not Palpable (Dopplerable)  DP Palpable Not Palpable (Dopplerable)    Gastrointestinal: soft, non-tender/non-distended. No guarding/reflex.  Musculoskeletal: M/S 5/5 throughout.  Extremities without ischemic changes.  No deformity or atrophy. No edema. Neurologic: Sensation grossly intact in extremities.  Symmetrical.  Speech is fluent. Motor exam as listed above. Psychiatric: Judgment intact, Mood & affect appropriate for pt's clinical situation. Dermatologic: Cellulitic changes of L groin.  Wound with fibrinous exudate  Lymph : No Cervical, Axillary, or Inguinal lymphadenopathy.    CBC Lab Results  Component Value Date   WBC 11.5 (H) 07/25/2024   HGB 9.7 (L) 07/25/2024   HCT 30.2 (L) 07/25/2024   MCV 91.0 07/25/2024   PLT 235 07/25/2024    BMET    Component Value Date/Time   NA 138 07/25/2024 0556   K 3.6 07/25/2024 0556   CL 103 07/25/2024 0556   CO2 24 07/25/2024 0556   GLUCOSE 91 07/25/2024 0556   BUN 27 (H) 07/25/2024 0556   CREATININE 1.10 (H) 07/25/2024 0556   CALCIUM  9.0 07/25/2024 0556   GFRNONAA 51 (L) 07/25/2024 0556   GFRAA >60 08/30/2017 1644   Estimated Creatinine Clearance: 33.7 mL/min (A) (by C-G formula based on SCr of 1.1 mg/dL (H)).  COAG Lab Results  Component Value Date   INR 1.0 07/25/2024    Radiology DG Chest 2 View Result Date: 07/24/2024 CLINICAL DATA:  Weakness for several days. EXAM: CHEST - 2 VIEW COMPARISON:  None Available. FINDINGS: The heart size and mediastinal contours are within normal limits. Very mild areas of scarring, atelectasis and/or infiltrate are seen along the periphery of the right upper lobe and left lung base. No pleural effusion or pneumothorax is identified. Multilevel degenerative changes seen throughout the thoracic spine and visualized portion of the upper lumbar spine. IMPRESSION: Very mild right upper lobe and left basilar scarring, atelectasis and/or infiltrate. Electronically Signed   By: Suzen Dials M.D.   On: 07/24/2024 12:53   PERIPHERAL VASCULAR CATHETERIZATION Result  Date: 06/27/2024 See surgical note for result.  PERIPHERAL VASCULAR CATHETERIZATION Result Date: 06/26/2024 See surgical note for  result.     Assessment/Plan 1. Wound Infection   On palpation of the wound I was not able to express any purulent drainage.  There also did not appear to be any pockets or areas that are concerning for a seroma or abscess.  The patient was also diagnosed with an acute kidney injury and had elevated creatinine levels and so at this time I would not recommend a CT to evaluate the area as it would likely show some fluid in the area due to recent postsurgical changes.  However if patient's creatinine returns closer to baseline this can be considered as she will require contrast for optimal visualization.  Pending wound cultures have not shown any growth as of yet.  Continue with antibiotics as recommended by infectious disease.  2. PAD  Currently the patient has an upcoming visit scheduled to undergo noninvasive studies after her procedure.  We can continue with his follow-up as planned so that we can also continue to keep an eye on her wound.  She was having difficulties with use of Plavix  and Eliquis .  We discussed her trialing Xarelto  but I do not believe she had a chance prior to coming to the hospital.  In that instance she should continue with use of aspirin  325 mg to prevent early thrombosis of her recent interventions.   Plan of care discussed with Dr.Dew and he is in agreement with plan noted above.    Thank you for allowing us  to participate in the care of this patient.   Jaley Yan E Zimir Kittleson, NP [facility] [phone] (Office Phone) [phone] (Office Fax) [phone] (Pager)  07/25/2024 4:38 PM  Staff may message me via secure chat in Epic  but this may not receive immediate response,  please page for urgent matters!  Dictation software was used to generate the above note. Typos may occur and escape review, as with  typed/written notes. Any error is purely unintentional.  Please contact me directly for clarity if needed.      [1]  Social History Tobacco Use   Smoking status: Former    Current packs/day: 0.00    Average packs/day: 0.5 packs/day    Types: E-cigarettes, Cigarettes    Quit date: 08/2019    Years since quitting: 4.9   Smokeless tobacco: Never  Vaping Use   Vaping status: Every Day   Devices: e-cigarette  Substance Use Topics   Alcohol use: No    Comment: can not tolerate alchohol   Drug use: No  [2]  Allergies Allergen Reactions   Calcium  Channel Blockers Shortness Of Breath, Anxiety, Palpitations and Other (See Comments)    Cognitive effects also--difficulty thinking   Hydrochlorothiazide Shortness Of Breath   Sodium Metabisulfite Anaphylaxis    Sulfite--in foods   Sulfites Swelling    Can't breathe   Telmisartan-Hctz Shortness Of Breath    Chest pain & fatigue    Cardizem [Diltiazem Hcl] Other (See Comments)    Difficulty eating, sleeping, and concentrating Heart goes crazy    Sulfa Antibiotics Hives   Acyclovir And Related    Apixaban  Nausea Only   Plavix  [Clopidogrel ]    Lexapro [Escitalopram Oxalate] Other (See Comments)    Gi upset   "

## 2024-07-25 NOTE — Progress Notes (Signed)
 Pharmacy Lovenox  Dosing  79 y.o. female admitted with Wound Check . Patient ordered Lovenox  30 mg daily for VTE prophylaxis.   Filed Weights   07/24/24 1208  Weight: 53.1 kg (117 lb)    Body mass index is 20.73 kg/m.  Estimated Creatinine Clearance: 33.7 mL/min (A) (by C-G formula based on SCr of 1.1 mg/dL (H)).  A/P: SCr improved. Will adjust Lovenox  dosing to 40 mg daily.  Bari Hamilton D 07/25/2024 9:29 AM

## 2024-07-25 NOTE — Plan of Care (Signed)

## 2024-07-25 NOTE — Consult Note (Addendum)
 NAME: Megan Irwin  DOB: July 01, 1946  MRN: [id number]  Date/Time: 07/25/2024 3:47 PM  REQUESTING PROVIDER: Dr.Williams Subjective:  REASON FOR CONSULT: surgical site infection ? Megan Irwin is a 79 y.o. female with a history of HTN, PAD, CAD recent left femoral endarterectomy and patch angioplasty on 06/27/24 presents with redness swelling at the surgical area and wound for a few days She had seen vascualr NP on 1/5 who took a culture from the wound and gave her doxy- came back as MSSA R to doxy, changed to clinda but she was admitted because of some chills, worsening redness at the site She did not have any  fever, but was not feeling well on Monday/Tuesday In the ED vitals  07/24/24 12:03  BP 133/68  Temp 97.4 F (36.3 C) !  Pulse Rate 82  Resp 16  SpO2 96 %     Latest Reference Range & Units 07/24/24 12:11  WBC 4.0 - 10.5 K/uL 17.6 (H)  Hemoglobin 12.0 - 15.0 g/dL 88.5 (L)  HCT 63.9 - 53.9 % 35.5 (L)  Platelets 150 - 400 K/uL 269  Creatinine 0.44 - 1.00 mg/dL 8.49 (H)   Blood culture was sent And she received Iv vanco She states she is feeling better  She was a smoker lifelong until 2018  Past Medical History:  Diagnosis Date   Anxiety    Cholelithiasis 09/2017   Dysrhythmia 2019   skipping a beat with palpatations   GERD (gastroesophageal reflux disease)    food gets stuck like an esophagitis   Hypertension    Insomnia    Migraines    magnesium  helps   NAFL (nonalcoholic fatty liver)    Restless leg syndrome    uses heating pad to resolve   Sciatica 2019   pt is in a good place after getting an SI joint injection    Past Surgical History:  Procedure Laterality Date   APPLICATION OF CELL SAVER Left 06/27/2024   Procedure: APPLICATION OF CELL SAVER;  Surgeon: Marea Selinda GORMAN, MD;  Location: [facility];  Service: Vascular;  Laterality: Left;   CHOLECYSTECTOMY N/A 10/03/2017   Procedure: LAPAROSCOPIC CHOLECYSTECTOMY;  Surgeon: Rodolph Romano, MD;   Location: [facility];  Service: General;  Laterality: N/A;   COLONOSCOPY     ENDARTERECTOMY FEMORAL Left 06/27/2024   Procedure: Left common femoral and superficial femoral artery endarterectomies and patch angioplasty;  Surgeon: Marea Selinda GORMAN, MD;  Location: [facility];  Service: Vascular;  Laterality: Left;   INSERT / REPLACE / REMOVE PACEMAKER     LOWER EXTREMITY ANGIOGRAPHY Left 02/25/2024   Procedure: Lower Extremity Angiography;  Surgeon: Marea Selinda GORMAN, MD;  Location: [facility];  Service: Cardiovascular;  Laterality: Left;   LOWER EXTREMITY ANGIOGRAPHY Right 03/03/2024   Procedure: Lower Extremity Angiography;  Surgeon: Marea Selinda GORMAN, MD;  Location: [facility];  Service: Cardiovascular;  Laterality: Right;   LOWER EXTREMITY ANGIOGRAPHY Left 06/26/2024   Procedure: Lower Extremity Angiography;  Surgeon: Marea Selinda GORMAN, MD;  Location: [facility];  Service: Cardiovascular;  Laterality: Left;   LOWER EXTREMITY ANGIOGRAPHY Left 06/27/2024   Procedure: Lower Extremity Angiography;  Surgeon: Marea Selinda GORMAN, MD;  Location: [facility];  Service: Cardiovascular;  Laterality: Left;   LOWER EXTREMITY INTERVENTION Left 06/26/2024   Procedure: LOWER EXTREMITY INTERVENTION;  Surgeon: Marea Selinda GORMAN, MD;  Location: [facility];  Service: Cardiovascular;  Laterality: Left;   TONSILLECTOMY     TUBAL  LIGATION      Social History   Socioeconomic History   Marital status: Married    Spouse name: Not on file   Number of children: Not on file   Years of education: Not on file   Highest education level: Not on file  Occupational History   Not on file  Tobacco Use   Smoking status: Former    Current packs/day: 0.00    Average packs/day: 0.5 packs/day    Types: E-cigarettes, Cigarettes    Quit date: 08/2019    Years since quitting: 4.9   Smokeless tobacco: Never  Vaping Use   Vaping status: Every Day   Devices: e-cigarette  Substance and Sexual Activity    Alcohol use: No    Comment: can not tolerate alchohol   Drug use: No   Sexual activity: Not on file  Other Topics Concern   Not on file  Social History Narrative   Not on file   Social Drivers of Health   Tobacco Use: Medium Risk (07/24/2024)   Patient History    Smoking Tobacco Use: Former    Smokeless Tobacco Use: Never    Passive Exposure: Not on file  Financial Resource Strain: Low Risk  (07/01/2024)   Received from [facility]   Overall Financial Resource Strain (CARDIA)    Difficulty of Paying Living Expenses: Not hard at all  Food Insecurity: No Food Insecurity (07/24/2024)   Epic    Worried About Radiation Protection Practitioner of Food in the Last Year: Never true    Ran Out of Food in the Last Year: Never true  Transportation Needs: No Transportation Needs (07/24/2024)   Epic    Lack of Transportation (Medical): No    Lack of Transportation (Non-Medical): No  Physical Activity: Not on file  Stress: Not on file  Social Connections: Moderately Integrated (07/24/2024)   Social Connection and Isolation Panel    Frequency of Communication with Friends and Family: More than three times a week    Frequency of Social Gatherings with Friends and Family: More than three times a week    Attends Religious Services: More than 4 times per year    Active Member of [employer] or Organizations: Yes    Attends Banker Meetings: 1 to 4 times per year    Marital Status: Widowed  Intimate Partner Violence: Not At Risk (07/24/2024)   Epic    Fear of Current or Ex-Partner: No    Emotionally Abused: No    Physically Abused: No    Sexually Abused: No  Depression (PHQ2-9): Not on file  Alcohol Screen: Not on file  Housing: Low Risk (07/24/2024)   Epic    Unable to Pay for Housing in the Last Year: No    Number of Times Moved in the Last Year: 0    Homeless in the Last Year: No  Utilities: Not At Risk (07/24/2024)   Epic    Threatened with loss of utilities: No  Health Literacy: Not on  file    Family History  Problem Relation Age of Onset   Pneumonia Mother    Hypertension Mother    Heart attack Father 41   Allergies[1] I? Current Facility-Administered Medications  Medication Dose Route Frequency Provider Last Rate Last Admin   0.9 %  sodium chloride  infusion   Intravenous Continuous Paudel, Keshab, MD   Stopped at 07/25/24 1031   acetaminophen  (TYLENOL ) tablet 650 mg  650 mg Oral Q6H PRN Roann Gouty, MD  Or   acetaminophen  (TYLENOL ) suppository 650 mg  650 mg Rectal Q6H PRN Roann Gouty, MD       aspirin  EC tablet 81 mg  81 mg Oral Daily Paudel, Gouty, MD   81 mg at 07/25/24 0913   atenolol  (TENORMIN ) tablet 50 mg  50 mg Oral Daily Paudel, Keshab, MD   50 mg at 07/25/24 0913   chlorthalidone  (HYGROTON ) tablet 25 mg  25 mg Oral Daily Trudy Anthony HERO, MD       collagenase  (SANTYL ) ointment   Topical Daily Trudy Anthony HERO, MD   Given at 07/25/24 1216   enoxaparin  (LOVENOX ) injection 40 mg  40 mg Subcutaneous QHS Trudy Anthony HERO, MD       fentaNYL  (SUBLIMAZE ) injection 25 mcg  25 mcg Intravenous Q2H PRN Roann Gouty, MD       gabapentin  (NEURONTIN ) capsule 300 mg  300 mg Oral Daily Paudel, Gouty, MD   300 mg at 07/25/24 9086   hydrALAZINE  (APRESOLINE ) injection 10 mg  10 mg Intravenous Q6H PRN Roann Gouty, MD       ondansetron  (ZOFRAN ) tablet 4 mg  4 mg Oral Q6H PRN Roann Gouty, MD       Or   ondansetron  (ZOFRAN ) injection 4 mg  4 mg Intravenous Q6H PRN Roann Gouty, MD       oxyCODONE  (Oxy IR/ROXICODONE ) immediate release tablet 5 mg  5 mg Oral Q4H PRN Paudel, Keshab, MD       polyethylene glycol (MIRALAX  / GLYCOLAX ) packet 17 g  17 g Oral Daily PRN Paudel, Keshab, MD       vancomycin  variable dose per unstable renal function (pharmacist dosing)   Does not apply See admin instructions Park, Leonor BROCKS, RPH         Abtx:  Anti-infectives (From admission, onward)    Start     Dose/Rate Route Frequency Ordered Stop   07/24/24 1835   vancomycin  variable dose per unstable renal function (pharmacist dosing)         Does not apply See admin instructions 07/24/24 1835     07/24/24 1545  vancomycin  (VANCOCIN ) IVPB 1000 mg/200 mL premix        1,000 mg 200 mL/hr over 60 Minutes Intravenous  Once 07/24/24 1540 07/24/24 1706       REVIEW OF SYSTEMS:  Const: negative fever, negative chills, negative weight loss Eyes: negative diplopia or visual changes, negative eye pain ENT: negative coryza, negative sore throat Resp: negative cough, hemoptysis, dyspnea Cards: negative for chest pain, palpitations, lower extremity edema GU: negative for frequency, dysuria and hematuria GI: Negative for abdominal pain, diarrhea, bleeding, constipation Skin: negative for rash and pruritus Heme: negative for easy bruising and gum/nose bleeding MS: negative for myalgias, arthralgias, back pain and muscle weakness Neurolo:negative for headaches, dizziness, vertigo, memory problems  Psych: negative for feelings of anxiety, depression  Endocrine: negative for thyroid, diabetes Allergy/Immunology- sulfa HIVES Many other meds documented- most of them side effects Objective:  VITALS:  BP (!) 145/56 (BP Location: Left Arm)   Pulse 73   Temp (!) 97.5 F (36.4 C)   Resp 18   Ht 5' 3 (1.6 m)   Wt 53.1 kg   SpO2 94%   BMI 20.73 kg/m   PHYSICAL EXAM:  General: Alert, cooperative, no distress, appears stated age.  Head: Normocephalic, without obvious abnormality, atraumatic. Eyes: Conjunctivae clear, anicteric sclerae. Pupils are equal ENT Nares normal. No drainage or sinus tenderness. Lips, mucosa, and tongue normal. No  Thrush Neck: Supple, symmetrical, no adenopathy, thyroid: non tender no carotid bruit and no JVD. Back: No CVA tenderness. Lungs: Clear to auscultation bilaterally. No Wheezing or Rhonchi. No rales. Heart: Regular rate and rhythm, no murmur, rub or gallop. Abdomen: Soft, non-tender,not distended. Bowel sounds normal. No  masses Extremities: left eg mild edema Wound femoral area with slough DP pulse felt Redness and swelling at the surgical site  atraumatic, no cyanosis. No edema. No clubbing Skin: No rashes or lesions. Or bruising Lymph: Cervical, supraclavicular normal. Neurologic: Grossly non-focal Pertinent Labs Lab Results CBC    Component Value Date/Time   WBC 11.5 (H) 07/25/2024 0556   RBC 3.32 (L) 07/25/2024 0556   HGB 9.7 (L) 07/25/2024 0556   HCT 30.2 (L) 07/25/2024 0556   PLT 235 07/25/2024 0556   MCV 91.0 07/25/2024 0556   MCH 29.2 07/25/2024 0556   MCHC 32.1 07/25/2024 0556   RDW 13.9 07/25/2024 0556   LYMPHSABS 1.4 07/24/2024 1211   MONOABS 1.0 07/24/2024 1211   EOSABS 0.1 07/24/2024 1211   BASOSABS 0.0 07/24/2024 1211       Latest Ref Rng & Units 07/25/2024    5:56 AM 07/24/2024   12:11 PM 06/29/2024    1:42 AM  CMP  Glucose 70 - 99 mg/dL 91  882  897   BUN 8 - 23 mg/dL 27  27  27    Creatinine 0.44 - 1.00 mg/dL 8.89  8.49  9.14   Sodium 135 - 145 mmol/L 138  135  142   Potassium 3.5 - 5.1 mmol/L 3.6  4.7  3.7   Chloride 98 - 111 mmol/L 103  97  108   CO2 22 - 32 mmol/L 24  25  27    Calcium  8.9 - 10.3 mg/dL 9.0  9.6  9.0   Total Protein 6.5 - 8.1 g/dL 6.0  7.4    Total Bilirubin 0.0 - 1.2 mg/dL 0.3  0.7    Alkaline Phos 38 - 126 U/L 225  259    AST 15 - 41 U/L 40  46    ALT 0 - 44 U/L 41  45        Microbiology: Recent Results (from the past 240 hours)  Aerobic culture     Status: Abnormal   Collection Time: 07/21/24  2:00 PM  Result Value Ref Range Status   Aerobic Bacterial Culture Final report (A)  Final   Organism ID, Bacteria Staphylococcus aureus (A)  Final    Comment: Based on susceptibility to oxacillin this isolate would be susceptible to: *Penicillinase-stable penicillins, such as:   Cloxacillin, Dicloxacillin, Nafcillin *Beta-lactam combination agents, such as:   Amoxicillin-clavulanic acid, Ampicillin-sulbactam,   Piperacillin-tazobactam *Oral  cephems, such as:   Cefaclor, Cefdinir, Cefpodoxime, Cefprozil, Cefuroxime,   Cephalexin, Loracarbef *Parenteral cephems, such as:   Cefazolin , Cefepime, Cefotaxime, Cefotetan, Ceftaroline,   Ceftizoxime, Ceftriaxone, Cefuroxime *Carbapenems, such as:   Doripenem, Ertapenem, Imipenem, Meropenem Light growth    Antimicrobial Susceptibility Comment  Final    Comment:       ** S = Susceptible; I = Intermediate; R = Resistant **                    P = Positive; N = Negative             MICS are expressed in micrograms per mL    Antibiotic                 RSLT#1  RSLT#2    RSLT#3    RSLT#4 Ciprofloxacin                  S Clindamycin                     S Erythromycin                   S Gentamicin                      S Levofloxacin                   S Linezolid                       S Moxifloxacin                   S Oxacillin                      S Penicillin                     R Rifampin                       S Tetracycline                   R Trimethoprim/Sulfa             S Vancomycin                      S   Blood culture (routine x 2)     Status: None (Preliminary result)   Collection Time: 07/24/24  4:09 PM   Specimen: BLOOD  Result Value Ref Range Status   Specimen Description BLOOD RIGHT ARM  Final   Special Requests   Final    BOTTLES DRAWN AEROBIC AND ANAEROBIC Blood Culture adequate volume   Culture   Final    NO GROWTH < 24 HOURS Performed at [facility], [address] [place], [place] [address]    Report Status PENDING  Incomplete  Blood culture (routine x 2)     Status: None (Preliminary result)   Collection Time: 07/24/24 10:51 PM   Specimen: BLOOD  Result Value Ref Range Status   Specimen Description BLOOD LEFT ANTECUBITAL  Final   Special Requests   Final    BOTTLES DRAWN AEROBIC AND ANAEROBIC Blood Culture adequate volume   Culture   Final    NO GROWTH < 12 HOURS Performed at [facility], [address] [place], [place]  [address]    Report Status PENDING  Incomplete   IMAGING RESULTS: Mild scarring rt UP and LLL  I have personally reviewed the films ? Impression/Recommendation Purulent cellulitis with soft tissue infection at the left femoral surgical site Concern for deeper vascular infection as she recently had endarterectomy and patch angioplasty of left common femoral and superficial femoral artery    Pt is currently on vancomycin - A sit is MSSA change to cefazolin  She needs CT imaging to look for deep infection including vascular patch infection Discussed with Dr.Dew- plan to do CT with contrast once cr normalizes IF no deep infection then she will not need IV antibiotics  Leucocytosis due to infection  PAD  AKI  HTN on atenolol , chlorthalidone   This consult involved complex antimicrobial management ? ? ________________________________________________ Discussed with patient, requesting provider  and vascular surgeon and ID pharmacist  [facility] physician covering by phone this weekend for urgent issues- call if needed      [1]  Allergies Allergen Reactions   Calcium  Channel Blockers Shortness Of Breath, Anxiety, Palpitations and Other (See Comments)    Cognitive effects also--difficulty thinking   Hydrochlorothiazide Shortness Of Breath   Sodium Metabisulfite Anaphylaxis    Sulfite--in foods   Sulfites Swelling    Can't breathe   Telmisartan-Hctz Shortness Of Breath    Chest pain & fatigue    Cardizem [Diltiazem Hcl] Other (See Comments)    Difficulty eating, sleeping, and concentrating Heart goes crazy    Sulfa Antibiotics Hives   Acyclovir And Related    Apixaban  Nausea Only   Plavix  [Clopidogrel ]    Lexapro [Escitalopram Oxalate] Other (See Comments)    Gi upset

## 2024-07-25 NOTE — Progress Notes (Signed)
*  PRELIMINARY RESULTS* Echocardiogram 2D Echocardiogram has been performed.  Megan Irwin 07/25/2024, 3:09 PM

## 2024-07-25 NOTE — Progress Notes (Signed)
 " PROGRESS NOTE    Megan Irwin  FMW:[id number] DOB: [birth year] DOA: 07/24/2024 PCP: Delfina Pao, MD   Assessment & Plan:   Principal Problem:   Wound infection Active Problems:   Dyslipidemia   Essential hypertension   Generalized anxiety disorder   Restless legs syndrome (RLS)   Ischemic leg  Assessment and Plan: Left groin wound infection: w/ staph aureus. Pus present at wound site. Continue on IV vanco. Blood cxs NGTD. Echo ordered. ID consulted   HTN: continue on atenolol  and restart home dose of chlorthalidone     HLD: no longer taking statin as per med rec    PAD: continue on aspirin     AKI: Cr is trending down from day prior. Avoid nephrotoxic meds        DVT prophylaxis: lovenox  Code Status: full Family Communication: discussed pt's care w/ pt's family at bedside and answered their questions  Disposition Plan: likely d/c back home  Level of care: Telemetry  Status is: Inpatient Remains inpatient appropriate because: severity of illness    Consultants:  ID  Procedures:   Antimicrobials: vanco    Subjective: Pt c/o fatigue   Objective: Vitals:   07/24/24 1832 07/24/24 2034 07/25/24 0418 07/25/24 0736  BP: (!) 164/61 (!) 145/61 128/67 (!) 145/56  Pulse: 80 78 69 73  Resp: 17 17 17 18   Temp:  97.9 F (36.6 C) 97.7 F (36.5 C) (!) 97.5 F (36.4 C)  TempSrc:      SpO2: 98% 100% 97% 94%  Weight:      Height:        Intake/Output Summary (Last 24 hours) at 07/25/2024 0950 Last data filed at 07/25/2024 0526 Gross per 24 hour  Intake 636.64 ml  Output --  Net 636.64 ml   Filed Weights   07/24/24 1208  Weight: 53.1 kg    Examination:  General exam: Appears calm and comfortable  Respiratory system: Clear to auscultation. Respiratory effort normal. Cardiovascular system: S1 & S2 +. No rubs, gallops or clicks. Gastrointestinal system: Abdomen is nondistended, soft and nontender.  Normal bowel sounds heard. Central nervous system:  Alert and oriented. Moves all extremities Psychiatry: Judgement and insight appear normal. Mood & affect appropriate.     Data Reviewed: I have personally reviewed following labs and imaging studies  CBC: Recent Labs  Lab 07/24/24 1211 07/25/24 0556  WBC 17.6* 11.5*  NEUTROABS 14.9*  --   HGB 11.4* 9.7*  HCT 35.5* 30.2*  MCV 90.6 91.0  PLT 269 235   Basic Metabolic Panel: Recent Labs  Lab 07/24/24 1211 07/25/24 0556  NA 135 138  K 4.7 3.6  CL 97* 103  CO2 25 24  GLUCOSE 117* 91  BUN 27* 27*  CREATININE 1.50* 1.10*  CALCIUM  9.6 9.0   GFR: Estimated Creatinine Clearance: 33.7 mL/min (A) (by C-G formula based on SCr of 1.1 mg/dL (H)). Liver Function Tests: Recent Labs  Lab 07/24/24 1211 07/25/24 0556  AST 46* 40  ALT 45* 41  ALKPHOS 259* 225*  BILITOT 0.7 0.3  PROT 7.4 6.0*  ALBUMIN 3.8 3.1*   No results for input(s): LIPASE, AMYLASE in the last 168 hours. No results for input(s): AMMONIA in the last 168 hours. Coagulation Profile: Recent Labs  Lab 07/25/24 0556  INR 1.0   Cardiac Enzymes: No results for input(s): CKTOTAL, CKMB, CKMBINDEX, TROPONINI in the last 168 hours. BNP (last 3 results) No results for input(s): PROBNP in the last 8760 hours. HbA1C: No results for input(s):  HGBA1C in the last 72 hours. CBG: No results for input(s): GLUCAP in the last 168 hours. Lipid Profile: No results for input(s): CHOL, HDL, LDLCALC, TRIG, CHOLHDL, LDLDIRECT in the last 72 hours. Thyroid Function Tests: No results for input(s): TSH, T4TOTAL, FREET4, T3FREE, THYROIDAB in the last 72 hours. Anemia Panel: No results for input(s): VITAMINB12, FOLATE, FERRITIN, TIBC, IRON, RETICCTPCT in the last 72 hours. Sepsis Labs: Recent Labs  Lab 07/24/24 1211 07/24/24 1609  LATICACIDVEN 1.4 1.7    Recent Results (from the past 240 hours)  Aerobic culture     Status: Abnormal   Collection Time: 07/21/24  2:00 PM   Result Value Ref Range Status   Aerobic Bacterial Culture Final report (A)  Final   Organism ID, Bacteria Staphylococcus aureus (A)  Final    Comment: Based on susceptibility to oxacillin this isolate would be susceptible to: *Penicillinase-stable penicillins, such as:   Cloxacillin, Dicloxacillin, Nafcillin *Beta-lactam combination agents, such as:   Amoxicillin-clavulanic acid, Ampicillin-sulbactam,   Piperacillin-tazobactam *Oral cephems, such as:   Cefaclor, Cefdinir, Cefpodoxime, Cefprozil, Cefuroxime,   Cephalexin, Loracarbef *Parenteral cephems, such as:   Cefazolin , Cefepime, Cefotaxime, Cefotetan, Ceftaroline,   Ceftizoxime, Ceftriaxone, Cefuroxime *Carbapenems, such as:   Doripenem, Ertapenem, Imipenem, Meropenem Light growth    Antimicrobial Susceptibility Comment  Final    Comment:       ** S = Susceptible; I = Intermediate; R = Resistant **                    P = Positive; N = Negative             MICS are expressed in micrograms per mL    Antibiotic                 RSLT#1    RSLT#2    RSLT#3    RSLT#4 Ciprofloxacin                  S Clindamycin                     S Erythromycin                   S Gentamicin                      S Levofloxacin                   S Linezolid                       S Moxifloxacin                   S Oxacillin                      S Penicillin                     R Rifampin                       S Tetracycline                   R Trimethoprim/Sulfa             S Vancomycin                      S   Blood culture (routine x 2)  Status: None (Preliminary result)   Collection Time: 07/24/24  4:09 PM   Specimen: BLOOD  Result Value Ref Range Status   Specimen Description BLOOD RIGHT ARM  Final   Special Requests   Final    BOTTLES DRAWN AEROBIC AND ANAEROBIC Blood Culture adequate volume   Culture   Final    NO GROWTH < 24 HOURS Performed at [facility], [address] [place], [place] [address]    Report Status  PENDING  Incomplete  Blood culture (routine x 2)     Status: None (Preliminary result)   Collection Time: 07/24/24 10:51 PM   Specimen: BLOOD  Result Value Ref Range Status   Specimen Description BLOOD LEFT ANTECUBITAL  Final   Special Requests   Final    BOTTLES DRAWN AEROBIC AND ANAEROBIC Blood Culture adequate volume   Culture   Final    NO GROWTH < 12 HOURS Performed at [facility], [address], [place], [place] [address]    Report Status PENDING  Incomplete         Radiology Studies: DG Chest 2 View Result Date: 07/24/2024 CLINICAL DATA:  Weakness for several days. EXAM: CHEST - 2 VIEW COMPARISON:  None Available. FINDINGS: The heart size and mediastinal contours are within normal limits. Very mild areas of scarring, atelectasis and/or infiltrate are seen along the periphery of the right upper lobe and left lung base. No pleural effusion or pneumothorax is identified. Multilevel degenerative changes seen throughout the thoracic spine and visualized portion of the upper lumbar spine. IMPRESSION: Very mild right upper lobe and left basilar scarring, atelectasis and/or infiltrate. Electronically Signed   By: Suzen Dials M.D.   On: 07/24/2024 12:53        Scheduled Meds:  aspirin  EC  81 mg Oral Daily   atenolol   50 mg Oral Daily   collagenase    Topical Daily   enoxaparin  (LOVENOX ) injection  40 mg Subcutaneous QHS   gabapentin   300 mg Oral Daily   vancomycin  variable dose per unstable renal function (pharmacist dosing)   Does not apply See admin instructions   Continuous Infusions:  sodium chloride  75 mL/hr at 07/25/24 0526     LOS: 1 day       Anthony CHRISTELLA Pouch, MD [facility] Pager [phone]  If 7PM-7AM, please contact night-coverage [email or web address] 07/25/2024, 9:50 AM   "

## 2024-07-25 NOTE — Progress Notes (Signed)
 Wound care performed to left groin as per wound order plan. Minimal drainage noted, denies pain.

## 2024-07-25 NOTE — Plan of Care (Signed)

## 2024-07-25 NOTE — Consult Note (Addendum)
 WOC Nurse Consult Note: Reason for Consult: requested to assess L groin wound. This remote consultation was conducted evaluating wound digital images downloaded by the staff member and pertinent information was reviewed in order to determine recommendations.  Wound type: Infected site after perform endarterectomy and angioplasty.  Addendum: Pt started Vancomicin. Will be follow by vascular. Pressure Injury POA: NA  Measurement: approx. 2 cm x 2 cm (see nursing flowsheet).  Due to slough covering the wound bed, it is not  possible measure the depth. Wound bed: 100% yellow adhere slough. Drainage (amount, consistency, odor) Moderate amount. Periwound: erythema, swelling. Dressing procedure/placement/frequency: Cleanse the wound with Vashe #[id number], nor rinse and allow to dry. Apply Santyl  to the wound bed, cover with a moistened gauze. Top with foam dressing or ABD pad. Change daily.  WOC team will not plan to follow further. Please reconsult if further assistance is needed. Thank-you,  Lela Holm MSN, RN, CWCN, CNS.  (Phone [phone])

## 2024-07-26 DIAGNOSIS — L089 Local infection of the skin and subcutaneous tissue, unspecified: Secondary | ICD-10-CM | POA: Diagnosis not present

## 2024-07-26 DIAGNOSIS — T148XXA Other injury of unspecified body region, initial encounter: Secondary | ICD-10-CM | POA: Diagnosis not present

## 2024-07-26 LAB — BASIC METABOLIC PANEL WITH GFR
Anion gap: 9 (ref 5–15)
BUN: 22 mg/dL (ref 8–23)
CO2: 25 mmol/L (ref 22–32)
Calcium: 9.2 mg/dL (ref 8.9–10.3)
Chloride: 106 mmol/L (ref 98–111)
Creatinine, Ser: 1.04 mg/dL — ABNORMAL HIGH (ref 0.44–1.00)
GFR, Estimated: 54 mL/min — ABNORMAL LOW
Glucose, Bld: 86 mg/dL (ref 70–99)
Potassium: 3.8 mmol/L (ref 3.5–5.1)
Sodium: 140 mmol/L (ref 135–145)

## 2024-07-26 LAB — CBC
HCT: 29.8 % — ABNORMAL LOW (ref 36.0–46.0)
Hemoglobin: 9.5 g/dL — ABNORMAL LOW (ref 12.0–15.0)
MCH: 28.9 pg (ref 26.0–34.0)
MCHC: 31.9 g/dL (ref 30.0–36.0)
MCV: 90.6 fL (ref 80.0–100.0)
Platelets: 283 K/uL (ref 150–400)
RBC: 3.29 MIL/uL — ABNORMAL LOW (ref 3.87–5.11)
RDW: 14 % (ref 11.5–15.5)
WBC: 9.8 K/uL (ref 4.0–10.5)
nRBC: 0 % (ref 0.0–0.2)

## 2024-07-26 MED ORDER — ATENOLOL 25 MG PO TABS
100.0000 mg | ORAL_TABLET | Freq: Every day | ORAL | Status: DC
  Administered 2024-07-27 – 2024-07-30 (×4): 100 mg via ORAL
  Filled 2024-07-26 (×4): qty 4

## 2024-07-26 NOTE — Progress Notes (Signed)
 " PROGRESS NOTE    Megan Irwin  FMW:[id number] DOB: 1945-09-07 DOA: 07/24/2024 PCP: Delfina Pao, MD   Assessment & Plan:   Principal Problem:   Wound infection Active Problems:   Dyslipidemia   Essential hypertension   Generalized anxiety disorder   Restless legs syndrome (RLS)   Ischemic leg   MSSA (methicillin susceptible Staphylococcus aureus) infection   Postoperative infection   AKI (acute kidney injury)   PAD (peripheral artery disease)  Assessment and Plan: Left groin wound infection: w/ staph aureus. Pus present at wound site. Abxs changed to IV cefazolin  as per ID. Blood cxs NGTD. Echo was neg for vegetations. ID following and recs apprec   HTN: continue on home dose of atenolol , chlorthalidone      HLD: no longer taking statin as per med rec    PAD: continue on aspirin     AKI: Cr is trending down again today. Avoid nephrotoxic meds        DVT prophylaxis: lovenox  Code Status: full Family Communication: Disposition Plan: likely d/c back home  Level of care: Telemetry  Status is: Inpatient Remains inpatient appropriate because: severity of illness    Consultants:  ID  Procedures:   Antimicrobials: cefazolin     Subjective: Pt c/o malaise   Objective: Vitals:   07/25/24 1633 07/25/24 1952 07/26/24 0524 07/26/24 0813  BP: (!) 148/64 (!) 145/55 (!) 108/51 125/61  Pulse: 66 82 65 72  Resp: 17 15 16 18   Temp: 98.2 F (36.8 C) 98.3 F (36.8 C) 97.6 F (36.4 C) 97.9 F (36.6 C)  TempSrc:  Oral Oral Oral  SpO2: 100% 97% 100% 97%  Weight:      Height:        Intake/Output Summary (Last 24 hours) at 07/26/2024 1031 Last data filed at 07/26/2024 0900 Gross per 24 hour  Intake 300 ml  Output --  Net 300 ml   Filed Weights   07/24/24 1208  Weight: 53.1 kg    Examination:  General exam: appears comfortable  Respiratory system: clear breath sounds b/l  Cardiovascular system: S1/S2+. No rubs or clicks  Gastrointestinal system: Abd  is soft, NT, ND & hypoactive bowel sounds. Central nervous system: alert & oriented. Moves all extremities Psychiatry: Judgement and insight appears normal. Appropriate mood and affect     Data Reviewed: I have personally reviewed following labs and imaging studies  CBC: Recent Labs  Lab 07/24/24 1211 07/25/24 0556 07/26/24 0457  WBC 17.6* 11.5* 9.8  NEUTROABS 14.9*  --   --   HGB 11.4* 9.7* 9.5*  HCT 35.5* 30.2* 29.8*  MCV 90.6 91.0 90.6  PLT 269 235 283   Basic Metabolic Panel: Recent Labs  Lab 07/24/24 1211 07/25/24 0556 07/26/24 0457  NA 135 138 140  K 4.7 3.6 3.8  CL 97* 103 106  CO2 25 24 25   GLUCOSE 117* 91 86  BUN 27* 27* 22  CREATININE 1.50* 1.10* 1.04*  CALCIUM  9.6 9.0 9.2   GFR: Estimated Creatinine Clearance: 35.7 mL/min (A) (by C-G formula based on SCr of 1.04 mg/dL (H)). Liver Function Tests: Recent Labs  Lab 07/24/24 1211 07/25/24 0556  AST 46* 40  ALT 45* 41  ALKPHOS 259* 225*  BILITOT 0.7 0.3  PROT 7.4 6.0*  ALBUMIN 3.8 3.1*   No results for input(s): LIPASE, AMYLASE in the last 168 hours. No results for input(s): AMMONIA in the last 168 hours. Coagulation Profile: Recent Labs  Lab 07/25/24 0556  INR 1.0   Cardiac  Enzymes: No results for input(s): CKTOTAL, CKMB, CKMBINDEX, TROPONINI in the last 168 hours. BNP (last 3 results) No results for input(s): PROBNP in the last 8760 hours. HbA1C: No results for input(s): HGBA1C in the last 72 hours. CBG: No results for input(s): GLUCAP in the last 168 hours. Lipid Profile: No results for input(s): CHOL, HDL, LDLCALC, TRIG, CHOLHDL, LDLDIRECT in the last 72 hours. Thyroid Function Tests: No results for input(s): TSH, T4TOTAL, FREET4, T3FREE, THYROIDAB in the last 72 hours. Anemia Panel: No results for input(s): VITAMINB12, FOLATE, FERRITIN, TIBC, IRON, RETICCTPCT in the last 72 hours. Sepsis Labs: Recent Labs  Lab 07/24/24 1211  07/24/24 1609  LATICACIDVEN 1.4 1.7    Recent Results (from the past 240 hours)  Aerobic culture     Status: Abnormal   Collection Time: 07/21/24  2:00 PM  Result Value Ref Range Status   Aerobic Bacterial Culture Final report (A)  Final   Organism ID, Bacteria Staphylococcus aureus (A)  Final    Comment: Based on susceptibility to oxacillin this isolate would be susceptible to: *Penicillinase-stable penicillins, such as:   Cloxacillin, Dicloxacillin, Nafcillin *Beta-lactam combination agents, such as:   Amoxicillin-clavulanic acid, Ampicillin-sulbactam,   Piperacillin-tazobactam *Oral cephems, such as:   Cefaclor, Cefdinir, Cefpodoxime, Cefprozil, Cefuroxime,   Cephalexin, Loracarbef *Parenteral cephems, such as:   Cefazolin , Cefepime, Cefotaxime, Cefotetan, Ceftaroline,   Ceftizoxime, Ceftriaxone, Cefuroxime *Carbapenems, such as:   Doripenem, Ertapenem, Imipenem, Meropenem Light growth    Antimicrobial Susceptibility Comment  Final    Comment:       ** S = Susceptible; I = Intermediate; R = Resistant **                    P = Positive; N = Negative             MICS are expressed in micrograms per mL    Antibiotic                 RSLT#1    RSLT#2    RSLT#3    RSLT#4 Ciprofloxacin                  S Clindamycin                     S Erythromycin                   S Gentamicin                      S Levofloxacin                   S Linezolid                       S Moxifloxacin                   S Oxacillin                      S Penicillin                     R Rifampin                       S Tetracycline                   R Trimethoprim/Sulfa  S Vancomycin                      S   Blood culture (routine x 2)     Status: None (Preliminary result)   Collection Time: 07/24/24  4:09 PM   Specimen: BLOOD  Result Value Ref Range Status   Specimen Description BLOOD RIGHT ARM  Final   Special Requests   Final    BOTTLES DRAWN AEROBIC AND ANAEROBIC Blood Culture  adequate volume   Culture   Final    NO GROWTH 1 DAY Performed at [facility], [address], [place], [place] [address]    Report Status PENDING  Incomplete  Blood culture (routine x 2)     Status: None (Preliminary result)   Collection Time: 07/24/24 10:51 PM   Specimen: BLOOD  Result Value Ref Range Status   Specimen Description BLOOD LEFT ANTECUBITAL  Final   Special Requests   Final    BOTTLES DRAWN AEROBIC AND ANAEROBIC Blood Culture adequate volume   Culture   Final    NO GROWTH < 24 HOURS Performed at [facility], [address] [place], [place] [address]    Report Status PENDING  Incomplete         Radiology Studies: ECHOCARDIOGRAM COMPLETE Result Date: 07/25/2024    ECHOCARDIOGRAM REPORT   Patient Name:   Megan Irwin Date of Exam: 07/25/2024 Medical Rec #:  [id number]       Height:       63.0 in Accession #:    [id number]      Weight:       117.0 lb Date of Birth:  01-14-1946       BSA:          1.540 m Patient Age:    78 years        BP:           145/56 mmHg Patient Gender: F               HR:           73 bpm. Exam Location:  [facility] Procedure: 2D Echo, Color Doppler and Cardiac Doppler (Both Spectral and Color            Flow Doppler were utilized during procedure). Indications:     Chest Pain R07.9  History:         Patient has no prior history of Echocardiogram examinations.                  Risk Factors:Hypertension. Anxiety.  Sonographer:     Christopher Furnace Referring Phys:  [id number] ANTHONY CHRISTELLA POUCH Diagnosing Phys: Cara JONETTA Lovelace MD IMPRESSIONS  1. Left ventricular ejection fraction, by estimation, is 60 to 65%. The left ventricle has normal function. The left ventricle has no regional wall motion abnormalities. Left ventricular diastolic parameters are consistent with Grade I diastolic dysfunction (impaired relaxation).  2. Right ventricular systolic function is normal. The right ventricular size is normal.  3. The mitral valve is grossly normal.  Moderate mitral valve regurgitation.  4. The aortic valve is normal in structure. Aortic valve regurgitation is moderate. Aortic valve sclerosis is present, with no evidence of aortic valve stenosis. FINDINGS  Left Ventricle: Left ventricular ejection fraction, by estimation, is 60 to 65%. The left ventricle has normal function. The left ventricle has no regional wall motion abnormalities. Strain was performed and the global longitudinal strain is indeterminate. The left ventricular  internal cavity size was normal in size. There is no left ventricular hypertrophy. Left ventricular diastolic parameters are consistent with Grade I diastolic dysfunction (impaired relaxation). Right Ventricle: The right ventricular size is normal. No increase in right ventricular wall thickness. Right ventricular systolic function is normal. Left Atrium: Left atrial size was normal in size. Right Atrium: Right atrial size was normal in size. Pericardium: There is no evidence of pericardial effusion. Mitral Valve: The mitral valve is grossly normal. Moderate mitral valve regurgitation. MV peak gradient, 3.4 mmHg. The mean mitral valve gradient is 2.0 mmHg. Tricuspid Valve: The tricuspid valve is normal in structure. Tricuspid valve regurgitation is trivial. Aortic Valve: The aortic valve is normal in structure. Aortic valve regurgitation is moderate. Aortic valve sclerosis is present, with no evidence of aortic valve stenosis. Aortic valve mean gradient measures 1.5 mmHg. Aortic valve peak gradient measures  3.3 mmHg. Aortic valve area, by VTI measures 2.59 cm. Pulmonic Valve: The pulmonic valve was not well visualized. Pulmonic valve regurgitation is not visualized. Aorta: The aortic root was not well visualized. IAS/Shunts: No atrial level shunt detected by color flow Doppler. Additional Comments: 3D was performed not requiring image post processing on an independent workstation and was indeterminate.  LEFT VENTRICLE PLAX 2D LVIDd:          4.26 cm LVIDs:         2.79 cm LV PW:         1.51 cm LV IVS:        1.46 cm LVOT diam:     2.00 cm LV SV:         42 LV SV Index:   27 LVOT Area:     3.14 cm LV IVRT:       112 msec  RIGHT VENTRICLE RV Basal diam:  3.68 cm RV Mid diam:    3.27 cm LEFT ATRIUM         Index LA diam:    3.70 cm 2.40 cm/m  AORTIC VALVE AV Area (Vmax):    2.45 cm AV Area (Vmean):   2.74 cm AV Area (VTI):     2.59 cm AV Vmax:           90.90 cm/s AV Vmean:          58.200 cm/s AV VTI:            0.162 m AV Peak Grad:      3.3 mmHg AV Mean Grad:      1.5 mmHg LVOT Vmax:         70.90 cm/s LVOT Vmean:        50.700 cm/s LVOT VTI:          0.133 m LVOT/AV VTI ratio: 0.82  AORTA Ao Root diam: 2.70 cm MITRAL VALVE               TRICUSPID VALVE MV Area (PHT): 2.93 cm    TR Peak grad:   78.9 mmHg MV Area VTI:   1.60 cm    TR Vmax:        444.00 cm/s MV Peak grad:  3.4 mmHg MV Mean grad:  2.0 mmHg    SHUNTS MV Vmax:       0.92 m/s    Systemic VTI:  0.13 m MV Vmean:      57.5 cm/s   Systemic Diam: 2.00 cm MV Decel Time: 259 msec MV E velocity: 74.70 cm/s MV A velocity: 89.20 cm/s MV E/A ratio:  0.84 Cara JONETTA Lovelace MD Electronically signed by Cara JONETTA Lovelace MD Signature Date/Time: 07/25/2024/6:05:57 PM    Final    DG Chest 2 View Result Date: 07/24/2024 CLINICAL DATA:  Weakness for several days. EXAM: CHEST - 2 VIEW COMPARISON:  None Available. FINDINGS: The heart size and mediastinal contours are within normal limits. Very mild areas of scarring, atelectasis and/or infiltrate are seen along the periphery of the right upper lobe and left lung base. No pleural effusion or pneumothorax is identified. Multilevel degenerative changes seen throughout the thoracic spine and visualized portion of the upper lumbar spine. IMPRESSION: Very mild right upper lobe and left basilar scarring, atelectasis and/or infiltrate. Electronically Signed   By: Suzen Dials M.D.   On: 07/24/2024 12:53        Scheduled Meds:  aspirin  EC  81 mg  Oral Daily   atenolol   50 mg Oral Daily   chlorthalidone   25 mg Oral Daily   collagenase    Topical Daily   enoxaparin  (LOVENOX ) injection  40 mg Subcutaneous QHS   gabapentin   300 mg Oral Daily   Continuous Infusions:   ceFAZolin  (ANCEF ) IV 2 g (07/26/24 0837)     LOS: 2 days       Anthony CHRISTELLA Pouch, MD [facility] Pager [phone]  If 7PM-7AM, please contact night-coverage [email or web address] 07/26/2024, 10:31 AM   "

## 2024-07-26 NOTE — Plan of Care (Signed)

## 2024-07-27 ENCOUNTER — Encounter (INDEPENDENT_AMBULATORY_CARE_PROVIDER_SITE_OTHER): Payer: Self-pay | Admitting: Nurse Practitioner

## 2024-07-27 DIAGNOSIS — L089 Local infection of the skin and subcutaneous tissue, unspecified: Secondary | ICD-10-CM | POA: Diagnosis not present

## 2024-07-27 DIAGNOSIS — T148XXA Other injury of unspecified body region, initial encounter: Secondary | ICD-10-CM | POA: Diagnosis not present

## 2024-07-27 LAB — BASIC METABOLIC PANEL WITH GFR
Anion gap: 9 (ref 5–15)
BUN: 18 mg/dL (ref 8–23)
CO2: 26 mmol/L (ref 22–32)
Calcium: 9.6 mg/dL (ref 8.9–10.3)
Chloride: 104 mmol/L (ref 98–111)
Creatinine, Ser: 0.94 mg/dL (ref 0.44–1.00)
GFR, Estimated: >60 mL/min
Glucose, Bld: 91 mg/dL (ref 70–99)
Potassium: 4.2 mmol/L (ref 3.5–5.1)
Sodium: 139 mmol/L (ref 135–145)

## 2024-07-27 LAB — CBC
HCT: 31.7 % — ABNORMAL LOW (ref 36.0–46.0)
Hemoglobin: 10 g/dL — ABNORMAL LOW (ref 12.0–15.0)
MCH: 28.7 pg (ref 26.0–34.0)
MCHC: 31.5 g/dL (ref 30.0–36.0)
MCV: 91.1 fL (ref 80.0–100.0)
Platelets: 298 K/uL (ref 150–400)
RBC: 3.48 MIL/uL — ABNORMAL LOW (ref 3.87–5.11)
RDW: 13.9 % (ref 11.5–15.5)
WBC: 10.8 K/uL — ABNORMAL HIGH (ref 4.0–10.5)
nRBC: 0 % (ref 0.0–0.2)

## 2024-07-27 NOTE — Plan of Care (Signed)

## 2024-07-27 NOTE — Progress Notes (Signed)
 "  Subjective:    Patient ID: Megan Irwin, female    DOB: 09-24-1945, 79 y.o.   MRN: [id number] Chief Complaint  Patient presents with   Follow-up    3 week follow up, staple removal    HPI  Discussed the use of AI scribe software for clinical note transcription with the patient, who gave verbal consent to proceed.  History of Present Illness Megan Irwin is an 79 year old female with peripheral arterial disease status post recent left lower extremity revascularization who presents with persistent left lower extremity swelling, numbness, pain, and concern for post-procedural wound infection.  She reports ongoing swelling of the left foot and leg since her recent vascular intervention. The swelling has improved from its peak but persists, with increased edema during daytime activity. She does not use compression stockings but elevates her leg when possible.  She continues to experience numbness in her toes, which began prior to the vascular event. There is mild improvement today with some return of tingling, but overall the numbness persists. She is concerned about potential permanent sensory loss but notes encouragement from the recent tingling.  She describes pain in the left lower extremity, with diffuse posterior leg pain yesterday that impaired ambulation. Today, the pain is more localized. She avoids palpation due to discomfort. She also experiences plantar foot pain upon standing after prolonged sitting or lying, which improves with ambulation.  She notes increased erythema and warmth at the groin wound site since yesterday, describing the area as more inflamed. She reports numbness adjacent to the wound, a sensation of staple pulling, and self-removal of one staple last night, with another staple now loose. She is concerned about swelling at the site, particularly after daytime activity.  She is currently taking aspirin  325 mg daily. She is unable to tolerate Eliquis  or Plavix   due to severe gastrointestinal side effects, including nausea and anorexia, and has not initiated Eliquis . She expresses concern about limited anticoagulation options and is willing to trial low-dose Xarelto .  She has a sulfa allergy and is aware that Bactrim is contraindicated, with doxycycline  as an alternative if antibiotics are required.    Results Staple removal from surgical wound Staples removed from groin incision. Minimal drainage noted. Incision with erythema, warmth, and fibrinous exudate. No evidence of seroma or fluctuance on palpation. Wound reinforced with Steri-Strips and dressing applied.  Wound culture Swab obtained from erythematous groin wound for culture.   Review of Systems     Objective:   Physical Exam  Physical Exam GENITOURINARY: Groin red and warm. No seroma felt in groin. Incision healing well. Fibrinous exudate in groin. EXTREMITIES: Right leg swollen. Foot warm, toes cool. Diminished pulse in foot, but pulse present.  BP (!) 157/90 (BP Location: Right Arm, Patient Position: Sitting, Cuff Size: Normal)   Pulse 93   Resp 18   Ht 5' 3 (1.6 m)   Wt 118 lb 6.4 oz (53.7 kg)   BMI 20.97 kg/m   Past Medical History:  Diagnosis Date   Anxiety    Cholelithiasis 09/2017   Dysrhythmia 2019   skipping a beat with palpatations   GERD (gastroesophageal reflux disease)    food gets stuck like an esophagitis   Hypertension    Insomnia    Migraines    magnesium  helps   NAFL (nonalcoholic fatty liver)    Restless leg syndrome    uses heating pad to resolve   Sciatica 2019   pt is in a good place  after getting an SI joint injection    Social History   Socioeconomic History   Marital status: Married    Spouse name: Not on file   Number of children: Not on file   Years of education: Not on file   Highest education level: Not on file  Occupational History   Not on file  Tobacco Use   Smoking status: Former    Current packs/day: 0.00    Average  packs/day: 0.5 packs/day    Types: E-cigarettes, Cigarettes    Quit date: 08/2019    Years since quitting: 4.9   Smokeless tobacco: Never  Vaping Use   Vaping status: Every Day   Devices: e-cigarette  Substance and Sexual Activity   Alcohol use: No    Comment: can not tolerate alchohol   Drug use: No   Sexual activity: Not on file  Other Topics Concern   Not on file  Social History Narrative   Not on file   Social Drivers of Health   Tobacco Use: Medium Risk (07/27/2024)   Patient History    Smoking Tobacco Use: Former    Smokeless Tobacco Use: Never    Passive Exposure: Not on file  Financial Resource Strain: Low Risk  (07/01/2024)   Received from [facility]   Overall Financial Resource Strain (CARDIA)    Difficulty of Paying Living Expenses: Not hard at all  Food Insecurity: No Food Insecurity (07/24/2024)   Epic    Worried About Radiation Protection Practitioner of Food in the Last Year: Never true    Ran Out of Food in the Last Year: Never true  Transportation Needs: No Transportation Needs (07/24/2024)   Epic    Lack of Transportation (Medical): No    Lack of Transportation (Non-Medical): No  Physical Activity: Not on file  Stress: Not on file  Social Connections: Moderately Integrated (07/24/2024)   Social Connection and Isolation Panel    Frequency of Communication with Friends and Family: More than three times a week    Frequency of Social Gatherings with Friends and Family: More than three times a week    Attends Religious Services: More than 4 times per year    Active Member of [employer] or Organizations: Yes    Attends Banker Meetings: 1 to 4 times per year    Marital Status: Widowed  Intimate Partner Violence: Not At Risk (07/24/2024)   Epic    Fear of Current or Ex-Partner: No    Emotionally Abused: No    Physically Abused: No    Sexually Abused: No  Depression (PHQ2-9): Not on file  Alcohol Screen: Not on file  Housing: Low Risk (07/24/2024)   Epic     Unable to Pay for Housing in the Last Year: No    Number of Times Moved in the Last Year: 0    Homeless in the Last Year: No  Utilities: Not At Risk (07/24/2024)   Epic    Threatened with loss of utilities: No  Health Literacy: Not on file    Past Surgical History:  Procedure Laterality Date   APPLICATION OF CELL SAVER Left 06/27/2024   Procedure: APPLICATION OF CELL SAVER;  Surgeon: Marea Selinda RAMAN, MD;  Location: [facility] ORS;  Service: Vascular;  Laterality: Left;   CHOLECYSTECTOMY N/A 10/03/2017   Procedure: LAPAROSCOPIC CHOLECYSTECTOMY;  Surgeon: Rodolph Romano, MD;  Location: [facility] ORS;  Service: General;  Laterality: N/A;   COLONOSCOPY     ENDARTERECTOMY FEMORAL Left 06/27/2024  Procedure: Left common femoral and superficial femoral artery endarterectomies and patch angioplasty;  Surgeon: Marea Selinda RAMAN, MD;  Location: [facility] ORS;  Service: Vascular;  Laterality: Left;   INSERT / REPLACE / REMOVE PACEMAKER     LOWER EXTREMITY ANGIOGRAPHY Left 02/25/2024   Procedure: Lower Extremity Angiography;  Surgeon: Marea Selinda RAMAN, MD;  Location: [facility] INVASIVE [facility];  Service: Cardiovascular;  Laterality: Left;   LOWER EXTREMITY ANGIOGRAPHY Right 03/03/2024   Procedure: Lower Extremity Angiography;  Surgeon: Marea Selinda RAMAN, MD;  Location: [facility] INVAS[facility] CV [facility];  Service: Cardiovascular;  Laterality: Right;   LOWER EXTREMITY ANGIOGRAPHY Left 06/26/2024   Procedure: Lower Extremity Angiography;  Surgeon: Marea Selinda RAMAN, MD;  Location: [facility];  Service: Cardiovascular;  Laterality: Left;   LOWER EXTREMITY ANGIOGRAPHY Left 06/27/2024   Procedure: Lower Extremity Angiography;  Surgeon: Marea Selinda RAMAN, MD;  Location: [facility];  Service: Cardiovascular;  Laterality: Left;   LOWER EXTREMITY INTERVENTION Left 06/26/2024   Procedure: LOWER EXTREMITY INTERVENTION;  Surgeon: Marea Selinda RAMAN, MD;  Location: [facility] INVAS[facility];  Service: Cardiovascular;  Laterality: Left;   TONSILLECTOMY      TUBAL LIGATION      Family History  Problem Relation Age of Onset   Pneumonia Mother    Hypertension Mother    Heart attack Father 17    Allergies[1]     Latest Ref Rng & Units 07/27/2024    5:53 AM 07/26/2024    4:57 AM 07/25/2024    5:56 AM  CBC  WBC 4.0 - 10.5 K/uL 10.8  9.8  11.5   Hemoglobin 12.0 - 15.0 g/dL 89.9  9.5  9.7   Hematocrit 36.0 - 46.0 % 31.7  29.8  30.2   Platelets 150 - 400 K/uL 298  283  235       CMP     Component Value Date/Time   NA 139 07/27/2024 0553   K 4.2 07/27/2024 0553   CL 104 07/27/2024 0553   CO2 26 07/27/2024 0553   GLUCOSE 91 07/27/2024 0553   BUN 18 07/27/2024 0553   CREATININE 0.94 07/27/2024 0553   CALCIUM  9.6 07/27/2024 0553   PROT 6.0 (L) 07/25/2024 0556   ALBUMIN 3.1 (L) 07/25/2024 0556   AST 40 07/25/2024 0556   ALT 41 07/25/2024 0556   ALKPHOS 225 (H) 07/25/2024 0556   BILITOT 0.3 07/25/2024 0556   GFRNONAA >60 07/27/2024 0553     No results found.     Assessment & Plan:   1. Peripheral arterial disease with history of revascularization (Primary) Peripheral arterial disease of the left lower extremity status post revascularization with post-procedural wound infection Post-revascularization groin wound infection with erythema, warmth, and drainage. No seroma or fluctuance. Intolerant to Eliquis  and Plavix . Sulfa allergy limits antibiotic options. Willing to trial low-dose Xarelto . Discussed risks of infection, delayed healing, and medication intolerance. - Removed staples from incision site. - Obtained wound culture. - Prescribed doxycycline  for empiric coverage. - Applied Steri-Strips and dressing. - Initiated wet-to-dry dressing changes until Santyl  available. - Prescribed Santyl  ointment for debridement. - Instructed to obtain gauze for dressing changes. - Scheduled follow-up in one week for wound assessment and ultrasound. - Prescribed one-week trial of low-dose Xarelto  (2.5 mg BID) with full-dose aspirin . -  Instructed to take Xarelto  and doxycycline  with food. - VAS US  ABI WITH/WO TBI; Future  2. Wound dehiscence See above      No current facility-administered medications on file prior to visit.  Current Outpatient Medications on File Prior to Visit  Medication Sig Dispense Refill   aspirin  EC 81 MG tablet Take 1 tablet (81 mg total) by mouth daily. Swallow whole. 30 tablet 12   atenolol  (TENORMIN ) 100 MG tablet Take 100 mg by mouth daily. In the morning     atorvastatin  (LIPITOR) 20 MG tablet Take 1 tablet (20 mg total) by mouth daily at 6 PM. Patient only takes half a tablet most times (Patient not taking: Reported on 07/24/2024) 30 tablet 5   B Complex-C (SUPER B COMPLEX PO) Take 1 capsule by mouth daily.     Calcium  Magnesium  Zinc  333-133-5 MG TABS Take 1 tablet by mouth daily.     chlorthalidone  (HYGROTON ) 25 MG tablet Take 25 mg by mouth daily.     gabapentin  (NEURONTIN ) 300 MG capsule Take 300 mg by mouth daily.     tizanidine  (ZANAFLEX ) 2 MG capsule Take 2 mg by mouth at bedtime as needed for muscle spasms. Take a 1/2 tablet every night     traMADol  (ULTRAM ) 50 MG tablet Take 1 tablet (50 mg total) by mouth every 6 (six) hours as needed. 20 tablet 0   valsartan (DIOVAN) 160 MG tablet Take 160 mg by mouth daily.      There are no Patient Instructions on file for this visit. Return in about 1 week (around 07/28/2024) for wound check; ABI JD/FB.   Orvin FORBES Daring, NP      [1]  Allergies Allergen Reactions   Calcium  Channel Blockers Shortness Of Breath, Anxiety, Palpitations and Other (See Comments)    Cognitive effects also--difficulty thinking   Hydrochlorothiazide Shortness Of Breath   Sodium Metabisulfite Anaphylaxis    Sulfite--in foods   Sulfites Swelling    Can't breathe   Telmisartan-Hctz Shortness Of Breath    Chest pain & fatigue    Cardizem [Diltiazem Hcl] Other (See Comments)    Difficulty eating, sleeping, and concentrating Heart goes crazy    Sulfa  Antibiotics Hives   Acyclovir And Related    Apixaban  Nausea Only   Plavix  [Clopidogrel ]    Lexapro [Escitalopram Oxalate] Other (See Comments)    Gi upset   "

## 2024-07-27 NOTE — Progress Notes (Signed)
 " PROGRESS NOTE    RUT BETTERTON  FMW:[id number] DOB: 20-Oct-1945 DOA: 07/24/2024 PCP: Delfina Pao, MD   Assessment & Plan:   Principal Problem:   Wound infection Active Problems:   Dyslipidemia   Essential hypertension   Generalized anxiety disorder   Restless legs syndrome (RLS)   Ischemic leg   MSSA (methicillin susceptible Staphylococcus aureus) infection   Postoperative infection   AKI (acute kidney injury)   PAD (peripheral artery disease)  Assessment and Plan: Left groin wound infection: w/ staph aureus. Continue on IV cefazolin  as per ID. Blood cxs NGTD. Echo was neg for vegetations. ID following and recs apprec   Rash: on lower part of LLE. Etiology unclear, ?related to above infection. Will continue to monitor.   HTN: continue on home dose of atenolol , chlorthalidone     HLD: no longer taking statin as per med rec    PAD: continue on aspirin     AKI: Cr is trending down daily         DVT prophylaxis: lovenox  Code Status: full Family Communication: discussed pt's care w/ pt's family at bedside and answered their questions  Disposition Plan: likely d/c back home  Level of care: Telemetry  Status is: Inpatient Remains inpatient appropriate because: severity of illness    Consultants:  ID  Procedures:   Antimicrobials: cefazolin     Subjective: Pt c/o rash of lower part of left leg   Objective: Vitals:   07/26/24 1511 07/26/24 2046 07/27/24 0407 07/27/24 0842  BP: (!) 152/72 (!) 144/69 (!) 97/53 (!) 148/71  Pulse: 77 85 68 77  Resp: 18 17 17 18   Temp: 98.1 F (36.7 C) (!) 97.3 F (36.3 C) (!) 97.2 F (36.2 C) 98.6 F (37 C)  TempSrc: Oral     SpO2: 100% 100% 96% 97%  Weight:      Height:        Intake/Output Summary (Last 24 hours) at 07/27/2024 0902 Last data filed at 07/26/2024 1745 Gross per 24 hour  Intake 440 ml  Output --  Net 440 ml   Filed Weights   07/24/24 1208  Weight: 53.1 kg    Examination:  General exam:  appears calm & comfortable Respiratory system: clear breath sounds b/l  Cardiovascular system: S1 & S2+. No rubs or clicks Gastrointestinal system: abd is soft, NT, ND & hypoactive bowel sounds  Central nervous system: alert & oriented. Moves all extremities  Psychiatry: Judgement and insight appears normal. Appropriate mood and affect    Data Reviewed: I have personally reviewed following labs and imaging studies  CBC: Recent Labs  Lab 07/24/24 1211 07/25/24 0556 07/26/24 0457 07/27/24 0553  WBC 17.6* 11.5* 9.8 10.8*  NEUTROABS 14.9*  --   --   --   HGB 11.4* 9.7* 9.5* 10.0*  HCT 35.5* 30.2* 29.8* 31.7*  MCV 90.6 91.0 90.6 91.1  PLT 269 235 283 298   Basic Metabolic Panel: Recent Labs  Lab 07/24/24 1211 07/25/24 0556 07/26/24 0457 07/27/24 0553  NA 135 138 140 139  K 4.7 3.6 3.8 4.2  CL 97* 103 106 104  CO2 25 24 25 26   GLUCOSE 117* 91 86 91  BUN 27* 27* 22 18  CREATININE 1.50* 1.10* 1.04* 0.94  CALCIUM  9.6 9.0 9.2 9.6   GFR: Estimated Creatinine Clearance: 39.5 mL/min (by C-G formula based on SCr of 0.94 mg/dL). Liver Function Tests: Recent Labs  Lab 07/24/24 1211 07/25/24 0556  AST 46* 40  ALT 45* 41  ALKPHOS  259* 225*  BILITOT 0.7 0.3  PROT 7.4 6.0*  ALBUMIN 3.8 3.1*   No results for input(s): LIPASE, AMYLASE in the last 168 hours. No results for input(s): AMMONIA in the last 168 hours. Coagulation Profile: Recent Labs  Lab 07/25/24 0556  INR 1.0   Cardiac Enzymes: No results for input(s): CKTOTAL, CKMB, CKMBINDEX, TROPONINI in the last 168 hours. BNP (last 3 results) No results for input(s): PROBNP in the last 8760 hours. HbA1C: No results for input(s): HGBA1C in the last 72 hours. CBG: No results for input(s): GLUCAP in the last 168 hours. Lipid Profile: No results for input(s): CHOL, HDL, LDLCALC, TRIG, CHOLHDL, LDLDIRECT in the last 72 hours. Thyroid Function Tests: No results for input(s): TSH,  T4TOTAL, FREET4, T3FREE, THYROIDAB in the last 72 hours. Anemia Panel: No results for input(s): VITAMINB12, FOLATE, FERRITIN, TIBC, IRON, RETICCTPCT in the last 72 hours. Sepsis Labs: Recent Labs  Lab 07/24/24 1211 07/24/24 1609  LATICACIDVEN 1.4 1.7    Recent Results (from the past 240 hours)  Aerobic culture     Status: Abnormal   Collection Time: 07/21/24  2:00 PM  Result Value Ref Range Status   Aerobic Bacterial Culture Final report (A)  Final   Organism ID, Bacteria Staphylococcus aureus (A)  Final    Comment: Based on susceptibility to oxacillin this isolate would be susceptible to: *Penicillinase-stable penicillins, such as:   Cloxacillin, Dicloxacillin, Nafcillin *Beta-lactam combination agents, such as:   Amoxicillin-clavulanic acid, Ampicillin-sulbactam,   Piperacillin-tazobactam *Oral cephems, such as:   Cefaclor, Cefdinir, Cefpodoxime, Cefprozil, Cefuroxime,   Cephalexin, Loracarbef *Parenteral cephems, such as:   Cefazolin , Cefepime, Cefotaxime, Cefotetan, Ceftaroline,   Ceftizoxime, Ceftriaxone, Cefuroxime *Carbapenems, such as:   Doripenem, Ertapenem, Imipenem, Meropenem Light growth    Antimicrobial Susceptibility Comment  Final    Comment:       ** S = Susceptible; I = Intermediate; R = Resistant **                    P = Positive; N = Negative             MICS are expressed in micrograms per mL    Antibiotic                 RSLT#1    RSLT#2    RSLT#3    RSLT#4 Ciprofloxacin                  S Clindamycin                     S Erythromycin                   S Gentamicin                      S Levofloxacin                   S Linezolid                       S Moxifloxacin                   S Oxacillin                      S Penicillin                     R Rifampin  S Tetracycline                   R Trimethoprim/Sulfa             S Vancomycin                      S   Blood culture (routine x 2)     Status:  None (Preliminary result)   Collection Time: 07/24/24  4:09 PM   Specimen: BLOOD  Result Value Ref Range Status   Specimen Description BLOOD RIGHT ARM  Final   Special Requests   Final    BOTTLES DRAWN AEROBIC AND ANAEROBIC Blood Culture adequate volume   Culture   Final    NO GROWTH 3 DAYS Performed at [facility], [address] [place], [place] [address]    Report Status PENDING  Incomplete  Blood culture (routine x 2)     Status: None (Preliminary result)   Collection Time: 07/24/24 10:51 PM   Specimen: BLOOD  Result Value Ref Range Status   Specimen Description BLOOD LEFT ANTECUBITAL  Final   Special Requests   Final    BOTTLES DRAWN AEROBIC AND ANAEROBIC Blood Culture adequate volume   Culture   Final    NO GROWTH 2 DAYS Performed at [facility], [address] [place], [place] [address]    Report Status PENDING  Incomplete         Radiology Studies: ECHOCARDIOGRAM COMPLETE Result Date: 07/25/2024    ECHOCARDIOGRAM REPORT   Patient Name:   ALIVEA GLADSON Date of Exam: 07/25/2024 Medical Rec #:  [id number]       Height:       63.0 in Accession #:    [id number]      Weight:       117.0 lb Date of Birth:  07/12/1946       BSA:          1.540 m Patient Age:    78 years        BP:           145/56 mmHg Patient Gender: F               HR:           73 bpm. Exam Location:  [facility] Procedure: 2D Echo, Color Doppler and Cardiac Doppler (Both Spectral and Color            Flow Doppler were utilized during procedure). Indications:     Chest Pain R07.9  History:         Patient has no prior history of Echocardiogram examinations.                  Risk Factors:Hypertension. Anxiety.  Sonographer:     Christopher Furnace Referring Phys:  [id number] ANTHONY CHRISTELLA POUCH Diagnosing Phys: Cara JONETTA Lovelace MD IMPRESSIONS  1. Left ventricular ejection fraction, by estimation, is 60 to 65%. The left ventricle has normal function. The left ventricle has no regional wall motion abnormalities.  Left ventricular diastolic parameters are consistent with Grade I diastolic dysfunction (impaired relaxation).  2. Right ventricular systolic function is normal. The right ventricular size is normal.  3. The mitral valve is grossly normal. Moderate mitral valve regurgitation.  4. The aortic valve is normal in structure. Aortic valve regurgitation is moderate. Aortic valve sclerosis is present, with no evidence of aortic valve stenosis. FINDINGS  Left Ventricle: Left ventricular ejection fraction, by  estimation, is 60 to 65%. The left ventricle has normal function. The left ventricle has no regional wall motion abnormalities. Strain was performed and the global longitudinal strain is indeterminate. The left ventricular internal cavity size was normal in size. There is no left ventricular hypertrophy. Left ventricular diastolic parameters are consistent with Grade I diastolic dysfunction (impaired relaxation). Right Ventricle: The right ventricular size is normal. No increase in right ventricular wall thickness. Right ventricular systolic function is normal. Left Atrium: Left atrial size was normal in size. Right Atrium: Right atrial size was normal in size. Pericardium: There is no evidence of pericardial effusion. Mitral Valve: The mitral valve is grossly normal. Moderate mitral valve regurgitation. MV peak gradient, 3.4 mmHg. The mean mitral valve gradient is 2.0 mmHg. Tricuspid Valve: The tricuspid valve is normal in structure. Tricuspid valve regurgitation is trivial. Aortic Valve: The aortic valve is normal in structure. Aortic valve regurgitation is moderate. Aortic valve sclerosis is present, with no evidence of aortic valve stenosis. Aortic valve mean gradient measures 1.5 mmHg. Aortic valve peak gradient measures  3.3 mmHg. Aortic valve area, by VTI measures 2.59 cm. Pulmonic Valve: The pulmonic valve was not well visualized. Pulmonic valve regurgitation is not visualized. Aorta: The aortic root was not  well visualized. IAS/Shunts: No atrial level shunt detected by color flow Doppler. Additional Comments: 3D was performed not requiring image post processing on an independent workstation and was indeterminate.  LEFT VENTRICLE PLAX 2D LVIDd:         4.26 cm LVIDs:         2.79 cm LV PW:         1.51 cm LV IVS:        1.46 cm LVOT diam:     2.00 cm LV SV:         42 LV SV Index:   27 LVOT Area:     3.14 cm LV IVRT:       112 msec  RIGHT VENTRICLE RV Basal diam:  3.68 cm RV Mid diam:    3.27 cm LEFT ATRIUM         Index LA diam:    3.70 cm 2.40 cm/m  AORTIC VALVE AV Area (Vmax):    2.45 cm AV Area (Vmean):   2.74 cm AV Area (VTI):     2.59 cm AV Vmax:           90.90 cm/s AV Vmean:          58.200 cm/s AV VTI:            0.162 m AV Peak Grad:      3.3 mmHg AV Mean Grad:      1.5 mmHg LVOT Vmax:         70.90 cm/s LVOT Vmean:        50.700 cm/s LVOT VTI:          0.133 m LVOT/AV VTI ratio: 0.82  AORTA Ao Root diam: 2.70 cm MITRAL VALVE               TRICUSPID VALVE MV Area (PHT): 2.93 cm    TR Peak grad:   78.9 mmHg MV Area VTI:   1.60 cm    TR Vmax:        444.00 cm/s MV Peak grad:  3.4 mmHg MV Mean grad:  2.0 mmHg    SHUNTS MV Vmax:       0.92 m/s    Systemic VTI:  0.13 m MV  Vmean:      57.5 cm/s   Systemic Diam: 2.00 cm MV Decel Time: 259 msec MV E velocity: 74.70 cm/s MV A velocity: 89.20 cm/s MV E/A ratio:  0.84 Dwayne D Callwood MD Electronically signed by Cara JONETTA Lovelace MD Signature Date/Time: 07/25/2024/6:05:57 PM    Final         Scheduled Meds:  aspirin  EC  81 mg Oral Daily   atenolol   100 mg Oral Daily   chlorthalidone   25 mg Oral Daily   collagenase    Topical Daily   enoxaparin  (LOVENOX ) injection  40 mg Subcutaneous QHS   gabapentin   300 mg Oral Daily   Continuous Infusions:   ceFAZolin  (ANCEF ) IV 2 g (07/27/24 0844)     LOS: 3 days       Anthony CHRISTELLA Pouch, MD [facility] Pager [phone]  If 7PM-7AM, please contact night-coverage [email or web address] 07/27/2024,  9:02 AM   "

## 2024-07-27 NOTE — Plan of Care (Signed)

## 2024-07-28 ENCOUNTER — Inpatient Hospital Stay

## 2024-07-28 DIAGNOSIS — T8140XA Infection following a procedure, unspecified, initial encounter: Secondary | ICD-10-CM | POA: Diagnosis not present

## 2024-07-28 DIAGNOSIS — D72829 Elevated white blood cell count, unspecified: Secondary | ICD-10-CM | POA: Diagnosis not present

## 2024-07-28 DIAGNOSIS — T361X5A Adverse effect of cephalosporins and other beta-lactam antibiotics, initial encounter: Secondary | ICD-10-CM | POA: Diagnosis not present

## 2024-07-28 DIAGNOSIS — L270 Generalized skin eruption due to drugs and medicaments taken internally: Secondary | ICD-10-CM

## 2024-07-28 DIAGNOSIS — T148XXA Other injury of unspecified body region, initial encounter: Secondary | ICD-10-CM | POA: Diagnosis not present

## 2024-07-28 DIAGNOSIS — L089 Local infection of the skin and subcutaneous tissue, unspecified: Secondary | ICD-10-CM | POA: Diagnosis not present

## 2024-07-28 LAB — CBC
HCT: 30.1 % — ABNORMAL LOW (ref 36.0–46.0)
Hemoglobin: 9.5 g/dL — ABNORMAL LOW (ref 12.0–15.0)
MCH: 28.6 pg (ref 26.0–34.0)
MCHC: 31.6 g/dL (ref 30.0–36.0)
MCV: 90.7 fL (ref 80.0–100.0)
Platelets: 272 K/uL (ref 150–400)
RBC: 3.32 MIL/uL — ABNORMAL LOW (ref 3.87–5.11)
RDW: 13.8 % (ref 11.5–15.5)
WBC: 10.9 K/uL — ABNORMAL HIGH (ref 4.0–10.5)
nRBC: 0 % (ref 0.0–0.2)

## 2024-07-28 LAB — BASIC METABOLIC PANEL WITH GFR
Anion gap: 8 (ref 5–15)
BUN: 20 mg/dL (ref 8–23)
CO2: 26 mmol/L (ref 22–32)
Calcium: 9.3 mg/dL (ref 8.9–10.3)
Chloride: 106 mmol/L (ref 98–111)
Creatinine, Ser: 1.15 mg/dL — ABNORMAL HIGH (ref 0.44–1.00)
GFR, Estimated: 48 mL/min — ABNORMAL LOW
Glucose, Bld: 107 mg/dL — ABNORMAL HIGH (ref 70–99)
Potassium: 4.5 mmol/L (ref 3.5–5.1)
Sodium: 140 mmol/L (ref 135–145)

## 2024-07-28 MED ORDER — CEFAZOLIN SODIUM-DEXTROSE 2-4 GM/100ML-% IV SOLN: 2.0000 g | Freq: Three times a day (TID) | INTRAVENOUS | Status: DC

## 2024-07-28 MED ORDER — SODIUM CHLORIDE 0.9 % IV SOLN: INTRAVENOUS | Status: DC

## 2024-07-28 MED ORDER — LINEZOLID 600 MG PO TABS
600.0000 mg | ORAL_TABLET | Freq: Two times a day (BID) | ORAL | Status: DC
  Administered 2024-07-28 – 2024-07-30 (×4): 600 mg via ORAL
  Filled 2024-07-28 (×5): qty 1

## 2024-07-28 MED ORDER — IOHEXOL 350 MG/ML SOLN
100.0000 mL | Freq: Once | INTRAVENOUS | Status: AC | PRN
  Administered 2024-07-28: 100 mL via INTRAVENOUS

## 2024-07-28 NOTE — Progress Notes (Signed)
 PHARMACY NOTE:  ANTIMICROBIAL RENAL DOSAGE ADJUSTMENT  Current antimicrobial regimen includes a mismatch between antimicrobial dosage and estimated renal function.  As per policy approved by the Pharmacy & Therapeutics and Medical Executive Committees, the antimicrobial dosage will be adjusted accordingly.  Current antimicrobial dosage:  cefazolin  2gm IV q12h  Indication: Groin wound, outpatient culture with MSSA  Renal Function:  Estimated Creatinine Clearance: 32.3 mL/min (A) (by C-G formula based on SCr of 1.15 mg/dL (H)). []      On intermittent HD, scheduled: []      On CRRT    Antimicrobial dosage has been changed to:  Cefazolin  2gm IV q8h   Additional comments:   Thank you for allowing pharmacy to be a part of this patient's care.  Roney Youtz, PharmD, BCPS, BCIDP Work Cell: [phone] 07/28/2024 10:10 AM

## 2024-07-28 NOTE — Progress Notes (Signed)
 [facility]  Daily Progress Note   Subjective  -   Patient resting fairly comfortable.  She has not noted rash that is felt to be from a drug allergy.  She is somewhat anxious at the prospect of undergoing additional surgery.  Objective Vitals:   07/28/24 0759 07/28/24 0956 07/28/24 1603 07/28/24 2010  BP: (!) 110/53 (!) 131/57 (!) 147/54 (!) 133/50  Pulse: 64 76 70 100  Resp: 18 18 18 18   Temp: 97.6 F (36.4 C) 97.6 F (36.4 C) 97.9 F (36.6 C) 97.7 F (36.5 C)  TempSrc: Oral Oral    SpO2: 99% 100% 100% 91%  Weight:      Height:        Intake/Output Summary (Last 24 hours) at 07/28/2024 2145 Last data filed at 07/28/2024 2018 Gross per 24 hour  Intake 840.38 ml  Output --  Net 840.38 ml    PULM  CTAB CV  RRR VASC  bilateral feet warm.  Nonpalpable pulses but dopplerable.  Erythema groin however patient appears to have drug allergy reaction as rash is progressing on abdomen and back Laboratory CBC    Component Value Date/Time   WBC 10.9 (H) 07/28/2024 0623   HGB 9.5 (L) 07/28/2024 0623   HCT 30.1 (L) 07/28/2024 0623   PLT 272 07/28/2024 0623    BMET    Component Value Date/Time   NA 140 07/28/2024 0623   K 4.5 07/28/2024 0623   CL 106 07/28/2024 0623   CO2 26 07/28/2024 0623   GLUCOSE 107 (H) 07/28/2024 0623   BUN 20 07/28/2024 0623   CREATININE 1.15 (H) 07/28/2024 0623   CALCIUM  9.3 07/28/2024 0623   GFRNONAA 48 (L) 07/28/2024 0623   GFRAA >60 08/30/2017 1644    Assessment/Planning: Discussed aspiration with the patient and daughter via phone I discussed the difference between aspiration versus surgical excision/drainage of seroma and wound VAC placement. We will plan on aspirating fluid from the seroma via ultrasound.  Patient will not be required to be NPO.  We discussed the risk, benefits and alternatives.  We also discussed that if the stroma is positive for MSSA, we will still need to move forward with excisional drainage of  seroma with wound VAC placement.  The patient and daughter are agreeable to the plan and moving forward with this.  Kroy Sprung E Hope Brandenburger  07/28/2024, 9:45 PM

## 2024-07-28 NOTE — Progress Notes (Signed)
 " PROGRESS NOTE    Megan Irwin  FMW:[id number] DOB: 03/29/1946 DOA: 07/24/2024 PCP: Delfina Pao, MD   Assessment & Plan:   Principal Problem:   Wound infection Active Problems:   Dyslipidemia   Essential hypertension   Generalized anxiety disorder   Restless legs syndrome (RLS)   Ischemic leg   MSSA (methicillin susceptible Staphylococcus aureus) infection   Postoperative infection   AKI (acute kidney injury)   PAD (peripheral artery disease)  Assessment and Plan: Left groin wound infection: w/ staph aureus. Continue on IV cefazolin  as per ID. Blood cxs NGTD. Echo was neg for vegetations. CT abd/pelvis is pending. ID following and recs apprec   Rash: on lower part of LLE and now back & abd. Etiology unclear, ?related to above infection vs drug rash. Will continue to monitor.   HTN: continue on home dose of atenolol , chlorthalidone     HLD: no longer taking statin as per med rec    PAD: continue on aspirin     AKI: Cr is labile. Avoid nephrotoxic meds         DVT prophylaxis: lovenox  Code Status: full Family Communication:  Disposition Plan: likely d/c back home  Level of care: Telemetry  Status is: Inpatient Remains inpatient appropriate because: severity of illness    Consultants:  ID  Procedures:   Antimicrobials: cefazolin     Subjective: Pt c/o rash is spreading   Objective: Vitals:   07/27/24 0842 07/27/24 1653 07/27/24 2051 07/28/24 0357  BP: (!) 148/71 (!) 145/60 100/83 98/61  Pulse: 77 65 72 67  Resp: 18 18 16 16   Temp: 98.6 F (37 C) 97.7 F (36.5 C) 98.3 F (36.8 C) 98.3 F (36.8 C)  TempSrc:      SpO2: 97% 100% 100% 100%  Weight:      Height:        Intake/Output Summary (Last 24 hours) at 07/28/2024 0914 Last data filed at 07/27/2024 1300 Gross per 24 hour  Intake 240 ml  Output --  Net 240 ml   Filed Weights   07/24/24 1208  Weight: 53.1 kg    Examination:  General exam: appears comfortable  Respiratory system:  clear breath sounds b/l Cardiovascular system: S1/S2+. No rubs or clicks  Gastrointestinal system: abd is soft, NT, ND, hypoactive bowel  Central nervous system: alert & oriented. Moves all extremities Psychiatry: Judgement and insight appears normal. Appropriate mood and affect     Data Reviewed: I have personally reviewed following labs and imaging studies  CBC: Recent Labs  Lab 07/24/24 1211 07/25/24 0556 07/26/24 0457 07/27/24 0553 07/28/24 0623  WBC 17.6* 11.5* 9.8 10.8* 10.9*  NEUTROABS 14.9*  --   --   --   --   HGB 11.4* 9.7* 9.5* 10.0* 9.5*  HCT 35.5* 30.2* 29.8* 31.7* 30.1*  MCV 90.6 91.0 90.6 91.1 90.7  PLT 269 235 283 298 272   Basic Metabolic Panel: Recent Labs  Lab 07/24/24 1211 07/25/24 0556 07/26/24 0457 07/27/24 0553 07/28/24 0623  NA 135 138 140 139 140  K 4.7 3.6 3.8 4.2 4.5  CL 97* 103 106 104 106  CO2 25 24 25 26 26   GLUCOSE 117* 91 86 91 107*  BUN 27* 27* 22 18 20   CREATININE 1.50* 1.10* 1.04* 0.94 1.15*  CALCIUM  9.6 9.0 9.2 9.6 9.3   GFR: Estimated Creatinine Clearance: 32.3 mL/min (A) (by C-G formula based on SCr of 1.15 mg/dL (H)). Liver Function Tests: Recent Labs  Lab 07/24/24 1211 07/25/24 0556  AST 46* 40  ALT 45* 41  ALKPHOS 259* 225*  BILITOT 0.7 0.3  PROT 7.4 6.0*  ALBUMIN 3.8 3.1*   No results for input(s): LIPASE, AMYLASE in the last 168 hours. No results for input(s): AMMONIA in the last 168 hours. Coagulation Profile: Recent Labs  Lab 07/25/24 0556  INR 1.0   Cardiac Enzymes: No results for input(s): CKTOTAL, CKMB, CKMBINDEX, TROPONINI in the last 168 hours. BNP (last 3 results) No results for input(s): PROBNP in the last 8760 hours. HbA1C: No results for input(s): HGBA1C in the last 72 hours. CBG: No results for input(s): GLUCAP in the last 168 hours. Lipid Profile: No results for input(s): CHOL, HDL, LDLCALC, TRIG, CHOLHDL, LDLDIRECT in the last 72 hours. Thyroid Function  Tests: No results for input(s): TSH, T4TOTAL, FREET4, T3FREE, THYROIDAB in the last 72 hours. Anemia Panel: No results for input(s): VITAMINB12, FOLATE, FERRITIN, TIBC, IRON, RETICCTPCT in the last 72 hours. Sepsis Labs: Recent Labs  Lab 07/24/24 1211 07/24/24 1609  LATICACIDVEN 1.4 1.7    Recent Results (from the past 240 hours)  Aerobic culture     Status: Abnormal   Collection Time: 07/21/24  2:00 PM  Result Value Ref Range Status   Aerobic Bacterial Culture Final report (A)  Final   Organism ID, Bacteria Staphylococcus aureus (A)  Final    Comment: Based on susceptibility to oxacillin this isolate would be susceptible to: *Penicillinase-stable penicillins, such as:   Cloxacillin, Dicloxacillin, Nafcillin *Beta-lactam combination agents, such as:   Amoxicillin-clavulanic acid, Ampicillin-sulbactam,   Piperacillin-tazobactam *Oral cephems, such as:   Cefaclor, Cefdinir, Cefpodoxime, Cefprozil, Cefuroxime,   Cephalexin, Loracarbef *Parenteral cephems, such as:   Cefazolin , Cefepime, Cefotaxime, Cefotetan, Ceftaroline,   Ceftizoxime, Ceftriaxone, Cefuroxime *Carbapenems, such as:   Doripenem, Ertapenem, Imipenem, Meropenem Light growth    Antimicrobial Susceptibility Comment  Final    Comment:       ** S = Susceptible; I = Intermediate; R = Resistant **                    P = Positive; N = Negative             MICS are expressed in micrograms per mL    Antibiotic                 RSLT#1    RSLT#2    RSLT#3    RSLT#4 Ciprofloxacin                  S Clindamycin                     S Erythromycin                   S Gentamicin                      S Levofloxacin                   S Linezolid                       S Moxifloxacin                   S Oxacillin                      S Penicillin  R Rifampin                       S Tetracycline                   R Trimethoprim/Sulfa             S Vancomycin                      S    Blood culture (routine x 2)     Status: None (Preliminary result)   Collection Time: 07/24/24  4:09 PM   Specimen: BLOOD  Result Value Ref Range Status   Specimen Description BLOOD RIGHT ARM  Final   Special Requests   Final    BOTTLES DRAWN AEROBIC AND ANAEROBIC Blood Culture adequate volume   Culture   Final    NO GROWTH 4 DAYS Performed at [facility], [address], [place], [place] [address]    Report Status PENDING  Incomplete  Blood culture (routine x 2)     Status: None (Preliminary result)   Collection Time: 07/24/24 10:51 PM   Specimen: BLOOD  Result Value Ref Range Status   Specimen Description BLOOD LEFT ANTECUBITAL  Final   Special Requests   Final    BOTTLES DRAWN AEROBIC AND ANAEROBIC Blood Culture adequate volume   Culture   Final    NO GROWTH 3 DAYS Performed at [facility], [address], [place], [place] [address]    Report Status PENDING  Incomplete         Radiology Studies: No results found.       Scheduled Meds:  aspirin  EC  81 mg Oral Daily   atenolol   100 mg Oral Daily   chlorthalidone   25 mg Oral Daily   collagenase    Topical Daily   enoxaparin  (LOVENOX ) injection  40 mg Subcutaneous QHS   gabapentin   300 mg Oral Daily   Continuous Infusions:   ceFAZolin  (ANCEF ) IV 2 g (07/28/24 0009)     LOS: 4 days       Anthony CHRISTELLA Pouch, MD [facility] Pager [phone]  If 7PM-7AM, please contact night-coverage [email or web address] 07/28/2024, 9:14 AM   "

## 2024-07-28 NOTE — Progress Notes (Signed)
 "  Date of Admission:  07/24/2024     ID: Megan Irwin is a 79 y.o. female Principal Problem:   Wound infection Active Problems:   Dyslipidemia   Essential hypertension   Generalized anxiety disorder   Restless legs syndrome (RLS)   Ischemic leg   MSSA (methicillin susceptible Staphylococcus aureus) infection   Postoperative infection   AKI (acute kidney injury)   PAD (peripheral artery disease)    Subjective: Pt has a rash on her left leg, hip and back Not itchy  Medications:   aspirin  EC  81 mg Oral Daily   atenolol   100 mg Oral Daily   chlorthalidone   25 mg Oral Daily   collagenase    Topical Daily   enoxaparin  (LOVENOX ) injection  40 mg Subcutaneous QHS   gabapentin   300 mg Oral Daily    Objective: Vital signs in last 24 hours: Patient Vitals for the past 24 hrs:  BP Temp Temp src Pulse Resp SpO2  07/28/24 0956 (!) 131/57 97.6 F (36.4 C) Oral 76 18 100 %  07/28/24 0759 (!) 110/53 97.6 F (36.4 C) Oral 64 18 99 %  07/28/24 0357 98/61 98.3 F (36.8 C) -- 67 16 100 %  07/27/24 2051 100/83 98.3 F (36.8 C) -- 72 16 100 %  07/27/24 1653 (!) 145/60 97.7 F (36.5 C) -- 65 18 100 %       PHYSICAL EXAM:  General: Alert, cooperative, no distress, appears stated age.  Lungs: Clear to auscultation bilaterally. No Wheezing or Rhonchi. No rales. Heart: Regular rate and rhythm, no murmur, rub or gallop. Abdomen: Soft, non-tender,not distended. Bowel sounds normal. No masses Extremities: atraumatic, no cyanosis. No edema. No clubbing Skin: left leg erythematous rash Left inguinal erythema Left hip and back erythematous macular /papular rash            Lymph: Cervical, supraclavicular normal. Neurologic: Grossly non-focal  Lab Results    Latest Ref Rng & Units 07/28/2024    6:23 AM 07/27/2024    5:53 AM 07/26/2024    4:57 AM  CBC  WBC 4.0 - 10.5 K/uL 10.9  10.8  9.8   Hemoglobin 12.0 - 15.0 g/dL 9.5  89.9  9.5   Hematocrit 36.0 - 46.0 % 30.1  31.7   29.8   Platelets 150 - 400 K/uL 272  298  283        Latest Ref Rng & Units 07/28/2024    6:23 AM 07/27/2024    5:53 AM 07/26/2024    4:57 AM  CMP  Glucose 70 - 99 mg/dL 892  91  86   BUN 8 - 23 mg/dL 20  18  22    Creatinine 0.44 - 1.00 mg/dL 8.84  9.05  8.95   Sodium 135 - 145 mmol/L 140  139  140   Potassium 3.5 - 5.1 mmol/L 4.5  4.2  3.8   Chloride 98 - 111 mmol/L 106  104  106   CO2 22 - 32 mmol/L 26  26  25    Calcium  8.9 - 10.3 mg/dL 9.3  9.6  9.2       Microbiology: 07/21/24 WC left groin MSSA Studies/Results:    Assessment/Plan: Purulent cellulitis with soft tissue infection at the left femoral surgical site Concern for deeper vascular infection as she recently had endarterectomy and patch angioplasty of left common femoral and superficial femoral artery   CT angio- collection in the femoral area   Pt is currently on cefazolin  Drug rash  likely Will DC cefazolin - Will start PO linezolid  She needs CT imaging to look for deep infection including vascular patch infection Discussed with Dr.Dew- plan to do CTA IF no deep infection then she will not need IV antibiotics or prolonged duration   Leucocytosis due to infection   PAD   AKI   HTN on atenolol , chlorthalidone    Discussed the management with patient, hospitalist and vascular team "

## 2024-07-28 NOTE — Plan of Care (Signed)

## 2024-07-28 NOTE — Plan of Care (Signed)
  Problem: Education: Goal: Knowledge of General Education information will improve Description: Including pain rating scale, medication(s)/side effects and non-pharmacologic comfort measures Outcome: Progressing   Problem: Clinical Measurements: Goal: Ability to maintain clinical measurements within normal limits will improve Outcome: Progressing   Problem: Coping: Goal: Level of anxiety will decrease Outcome: Progressing   

## 2024-07-28 NOTE — H&P (View-Only) (Signed)
 [facility]  Daily Progress Note   Subjective  -   Patient resting fairly comfortable.  She has not noted rash that is felt to be from a drug allergy.  She is somewhat anxious at the prospect of undergoing additional surgery.  Objective Vitals:   07/28/24 0759 07/28/24 0956 07/28/24 1603 07/28/24 2010  BP: (!) 110/53 (!) 131/57 (!) 147/54 (!) 133/50  Pulse: 64 76 70 100  Resp: 18 18 18 18   Temp: 97.6 F (36.4 C) 97.6 F (36.4 C) 97.9 F (36.6 C) 97.7 F (36.5 C)  TempSrc: Oral Oral    SpO2: 99% 100% 100% 91%  Weight:      Height:        Intake/Output Summary (Last 24 hours) at 07/28/2024 2145 Last data filed at 07/28/2024 2018 Gross per 24 hour  Intake 840.38 ml  Output --  Net 840.38 ml    PULM  CTAB CV  RRR VASC  bilateral feet warm.  Nonpalpable pulses but dopplerable.  Erythema groin however patient appears to have drug allergy reaction as rash is progressing on abdomen and back Laboratory CBC    Component Value Date/Time   WBC 10.9 (H) 07/28/2024 0623   HGB 9.5 (L) 07/28/2024 0623   HCT 30.1 (L) 07/28/2024 0623   PLT 272 07/28/2024 0623    BMET    Component Value Date/Time   NA 140 07/28/2024 0623   K 4.5 07/28/2024 0623   CL 106 07/28/2024 0623   CO2 26 07/28/2024 0623   GLUCOSE 107 (H) 07/28/2024 0623   BUN 20 07/28/2024 0623   CREATININE 1.15 (H) 07/28/2024 0623   CALCIUM  9.3 07/28/2024 0623   GFRNONAA 48 (L) 07/28/2024 0623   GFRAA >60 08/30/2017 1644    Assessment/Planning: Discussed aspiration with the patient and daughter via phone I discussed the difference between aspiration versus surgical excision/drainage of seroma and wound VAC placement. We will plan on aspirating fluid from the seroma via ultrasound.  Patient will not be required to be NPO.  We discussed the risk, benefits and alternatives.  We also discussed that if the stroma is positive for MSSA, we will still need to move forward with excisional drainage of  seroma with wound VAC placement.  The patient and daughter are agreeable to the plan and moving forward with this.  Kroy Sprung E Hope Brandenburger  07/28/2024, 9:45 PM

## 2024-07-29 ENCOUNTER — Encounter: Payer: Self-pay | Admitting: Vascular Surgery

## 2024-07-29 ENCOUNTER — Encounter: Payer: Self-pay | Attending: Hospitalist

## 2024-07-29 DIAGNOSIS — D72829 Elevated white blood cell count, unspecified: Secondary | ICD-10-CM | POA: Diagnosis not present

## 2024-07-29 DIAGNOSIS — L7634 Postprocedural seroma of skin and subcutaneous tissue following other procedure: Principal | ICD-10-CM

## 2024-07-29 DIAGNOSIS — L270 Generalized skin eruption due to drugs and medicaments taken internally: Secondary | ICD-10-CM | POA: Diagnosis not present

## 2024-07-29 DIAGNOSIS — L089 Local infection of the skin and subcutaneous tissue, unspecified: Secondary | ICD-10-CM | POA: Diagnosis not present

## 2024-07-29 DIAGNOSIS — T8140XA Infection following a procedure, unspecified, initial encounter: Secondary | ICD-10-CM | POA: Diagnosis not present

## 2024-07-29 DIAGNOSIS — T361X5A Adverse effect of cephalosporins and other beta-lactam antibiotics, initial encounter: Secondary | ICD-10-CM | POA: Diagnosis not present

## 2024-07-29 DIAGNOSIS — T148XXA Other injury of unspecified body region, initial encounter: Secondary | ICD-10-CM | POA: Diagnosis not present

## 2024-07-29 HISTORY — PX: FINE NEEDLE ASPIRATION BIOPSY: CATH118315

## 2024-07-29 LAB — BASIC METABOLIC PANEL WITH GFR
Anion gap: 8 (ref 5–15)
BUN: 18 mg/dL (ref 8–23)
CO2: 25 mmol/L (ref 22–32)
Calcium: 9 mg/dL (ref 8.9–10.3)
Chloride: 108 mmol/L (ref 98–111)
Creatinine, Ser: 0.93 mg/dL (ref 0.44–1.00)
GFR, Estimated: >60 mL/min
Glucose, Bld: 83 mg/dL (ref 70–99)
Potassium: 4 mmol/L (ref 3.5–5.1)
Sodium: 140 mmol/L (ref 135–145)

## 2024-07-29 LAB — CBC WITH DIFFERENTIAL/PLATELET
Abs Immature Granulocytes: 0.68 K/uL — ABNORMAL HIGH (ref 0.00–0.07)
Basophils Absolute: 0.1 K/uL (ref 0.0–0.1)
Basophils Relative: 1 %
Eosinophils Absolute: 1.1 K/uL — ABNORMAL HIGH (ref 0.0–0.5)
Eosinophils Relative: 8 %
HCT: 30.1 % — ABNORMAL LOW (ref 36.0–46.0)
Hemoglobin: 9.3 g/dL — ABNORMAL LOW (ref 12.0–15.0)
Immature Granulocytes: 5 %
Lymphocytes Relative: 16 %
Lymphs Abs: 2.2 K/uL (ref 0.7–4.0)
MCH: 28.6 pg (ref 26.0–34.0)
MCHC: 30.9 g/dL (ref 30.0–36.0)
MCV: 92.6 fL (ref 80.0–100.0)
Monocytes Absolute: 1.2 K/uL — ABNORMAL HIGH (ref 0.1–1.0)
Monocytes Relative: 9 %
Neutro Abs: 8.2 K/uL — ABNORMAL HIGH (ref 1.7–7.7)
Neutrophils Relative %: 61 %
Platelets: 325 K/uL (ref 150–400)
RBC: 3.25 MIL/uL — ABNORMAL LOW (ref 3.87–5.11)
RDW: 14 % (ref 11.5–15.5)
WBC: 13.4 K/uL — ABNORMAL HIGH (ref 4.0–10.5)
nRBC: 0 % (ref 0.0–0.2)

## 2024-07-29 LAB — CULTURE, BLOOD (ROUTINE X 2)
Culture: NO GROWTH
Special Requests: ADEQUATE

## 2024-07-29 LAB — CBC
HCT: 30.4 % — ABNORMAL LOW (ref 36.0–46.0)
Hemoglobin: 9.3 g/dL — ABNORMAL LOW (ref 12.0–15.0)
MCH: 28.5 pg (ref 26.0–34.0)
MCHC: 30.6 g/dL (ref 30.0–36.0)
MCV: 93.3 fL (ref 80.0–100.0)
Platelets: 317 K/uL (ref 150–400)
RBC: 3.26 MIL/uL — ABNORMAL LOW (ref 3.87–5.11)
RDW: 13.7 % (ref 11.5–15.5)
WBC: 13.5 K/uL — ABNORMAL HIGH (ref 4.0–10.5)
nRBC: 0 % (ref 0.0–0.2)

## 2024-07-29 MED ORDER — DIPHENHYDRAMINE HCL 25 MG PO CAPS
25.0000 mg | ORAL_CAPSULE | Freq: Four times a day (QID) | ORAL | Status: DC | PRN
  Administered 2024-07-29 – 2024-07-30 (×2): 25 mg via ORAL
  Filled 2024-07-29 (×2): qty 1

## 2024-07-29 MED ORDER — LIDOCAINE-EPINEPHRINE (PF) 1 %-1:200000 IJ SOLN
INTRAMUSCULAR | Status: DC | PRN
  Administered 2024-07-29: 2 mL

## 2024-07-29 NOTE — Interval H&P Note (Signed)
 History and Physical Interval Note:  07/29/2024 12:12 PM  Megan Irwin  has presented today for surgery, with the diagnosis of Seroma.  The various methods of treatment have been discussed with the patient and family. After consideration of risks, benefits and other options for treatment, the patient has consented to  Procedures: Fine Needle Aspiration Biopsy (Left) as a surgical intervention.  The patient's history has been reviewed, patient examined, no change in status, stable for surgery.  I have reviewed the patient's chart and labs.  Questions were answered to the patient's satisfaction.     Kaede Clendenen

## 2024-07-29 NOTE — Progress Notes (Signed)
 " PROGRESS NOTE   HPI was taken from Dr. Roann: Megan Irwin is a pleasant 79 y.o. female with medical history significant for HTN, HLD, GERD, PAD s/p s/p critical limb ischemia LLE, s/p  Left common femoral and superficial femoral artery endarterectomies and patch angioplasty  on 06/27/2024 who came in to ED for left groin nonhealing wound after vascular procedure.  Patient stated that she noticed some redness and worsening pain in her incision site.  She went to primary care doctor office 3 days ago where wound culture was done and was started on antibiotic doxycycline .  Wound culture came back positive for Staph aureus and resistant to oral antibiotics.  Patient was advised to go to the emergency room for evaluation.  Patient denied any fever, chills, nausea, vomiting, diarrhea, chest pain, palpitations.  However, patient stated that she had decreased oral intake and was not able to eat and drink well.   ED Course: Upon arrival to the ED, patient is found to have left groin wound with yellowish drainage.  Mild erythema around the wound.  Patient was started on vancomycin  and sent 2 sets of blood cultures.  Hospitalist service was consulted for evaluation for admission for left groin infection with failed outpatient treatment with doxycycline  growing Staph aureus.    Megan Irwin  FMW:[id number] DOB: Mar 02, 1946 DOA: 07/24/2024 PCP: Delfina Pao, MD   Assessment & Plan:   Principal Problem:   Wound infection Active Problems:   Dyslipidemia   Essential hypertension   Generalized anxiety disorder   Restless legs syndrome (RLS)   Ischemic leg   MSSA (methicillin susceptible Staphylococcus aureus) infection   Postoperative infection   AKI (acute kidney injury)   PAD (peripheral artery disease)  Assessment and Plan: Left groin wound infection: w/ staph aureus. Abxs changed to linezolid  as per ID. Blood cxs NGTD. Echo was neg for vegetations. CT abd/pelvis shows Large rim enhancing fluid  collection within the left groin and proximal thigh, anterior to the left common femoral artery and extending inferiorly into the subcutaneous soft tissues of the anterior, proximal thigh. Findings could represent postoperative fluid collection versus inflammatory or infected fluid collection. Will go for aspiration of fluid today by vasc surg.  ID following and recs apprec   Rash: on lower part of LLE and now back & abd. Etiology unclear, ?related to above infection vs drug rash. D/c IV cefazolin  and start linezolid  as per ID.   HTN: continue on home dose of atenolol , chlorthalidone     HLD: no longer taking statin as per med rec    PAD: s/p stents placed previously. Continue on aspirin    AKI: Cr is labile. Avoid nephrotoxic meds         DVT prophylaxis: lovenox  Code Status: full Family Communication:  Disposition Plan: likely d/c back home  Level of care: Telemetry  Status is: Inpatient Remains inpatient appropriate because: severity of illness    Consultants:  ID  Procedures:   Antimicrobials: linezolid    Subjective: Pt c/o fatigue   Objective: Vitals:   07/28/24 2010 07/29/24 0503 07/29/24 0743 07/29/24 0744  BP: (!) 133/50 102/87 (!) 160/79 (!) 149/78  Pulse: 100 68 71 70  Resp: 18 17 18 18   Temp: 97.7 F (36.5 C) 97.7 F (36.5 C) 98.2 F (36.8 C) 98.1 F (36.7 C)  TempSrc:   Oral Oral  SpO2: 91% 97% 100% 98%  Weight:      Height:        Intake/Output Summary (Last 24  hours) at 07/29/2024 0913 Last data filed at 07/29/2024 0042 Gross per 24 hour  Intake 923.81 ml  Output --  Net 923.81 ml   Filed Weights   07/24/24 1208  Weight: 53.1 kg    Examination:  General exam: appears calm & comfortable  Respiratory system: clear breath sounds b/l  Cardiovascular system: S1/S2+. No rubs or clicks  Gastrointestinal system: abd is soft, NT, ND & normal bowel sounds  Central nervous system: alert & oriented. Moves all extremities  Psychiatry:  Judgement and insight appears at baseline. Appropriate mood and affect     Data Reviewed: I have personally reviewed following labs and imaging studies  CBC: Recent Labs  Lab 07/24/24 1211 07/25/24 0556 07/26/24 0457 07/27/24 0553 07/28/24 0623 07/29/24 0613  WBC 17.6* 11.5* 9.8 10.8* 10.9* 13.5*  NEUTROABS 14.9*  --   --   --   --   --   HGB 11.4* 9.7* 9.5* 10.0* 9.5* 9.3*  HCT 35.5* 30.2* 29.8* 31.7* 30.1* 30.4*  MCV 90.6 91.0 90.6 91.1 90.7 93.3  PLT 269 235 283 298 272 317   Basic Metabolic Panel: Recent Labs  Lab 07/25/24 0556 07/26/24 0457 07/27/24 0553 07/28/24 0623 07/29/24 0613  NA 138 140 139 140 140  K 3.6 3.8 4.2 4.5 4.0  CL 103 106 104 106 108  CO2 24 25 26 26 25   GLUCOSE 91 86 91 107* 83  BUN 27* 22 18 20 18   CREATININE 1.10* 1.04* 0.94 1.15* 0.93  CALCIUM  9.0 9.2 9.6 9.3 9.0   GFR: Estimated Creatinine Clearance: 39.9 mL/min (by C-G formula based on SCr of 0.93 mg/dL). Liver Function Tests: Recent Labs  Lab 07/24/24 1211 07/25/24 0556  AST 46* 40  ALT 45* 41  ALKPHOS 259* 225*  BILITOT 0.7 0.3  PROT 7.4 6.0*  ALBUMIN 3.8 3.1*   No results for input(s): LIPASE, AMYLASE in the last 168 hours. No results for input(s): AMMONIA in the last 168 hours. Coagulation Profile: Recent Labs  Lab 07/25/24 0556  INR 1.0   Cardiac Enzymes: No results for input(s): CKTOTAL, CKMB, CKMBINDEX, TROPONINI in the last 168 hours. BNP (last 3 results) No results for input(s): PROBNP in the last 8760 hours. HbA1C: No results for input(s): HGBA1C in the last 72 hours. CBG: No results for input(s): GLUCAP in the last 168 hours. Lipid Profile: No results for input(s): CHOL, HDL, LDLCALC, TRIG, CHOLHDL, LDLDIRECT in the last 72 hours. Thyroid Function Tests: No results for input(s): TSH, T4TOTAL, FREET4, T3FREE, THYROIDAB in the last 72 hours. Anemia Panel: No results for input(s): VITAMINB12, FOLATE, FERRITIN,  TIBC, IRON, RETICCTPCT in the last 72 hours. Sepsis Labs: Recent Labs  Lab 07/24/24 1211 07/24/24 1609  LATICACIDVEN 1.4 1.7    Recent Results (from the past 240 hours)  Aerobic culture     Status: Abnormal   Collection Time: 07/21/24  2:00 PM  Result Value Ref Range Status   Aerobic Bacterial Culture Final report (A)  Final   Organism ID, Bacteria Staphylococcus aureus (A)  Final    Comment: Based on susceptibility to oxacillin this isolate would be susceptible to: *Penicillinase-stable penicillins, such as:   Cloxacillin, Dicloxacillin, Nafcillin *Beta-lactam combination agents, such as:   Amoxicillin-clavulanic acid, Ampicillin-sulbactam,   Piperacillin-tazobactam *Oral cephems, such as:   Cefaclor, Cefdinir, Cefpodoxime, Cefprozil, Cefuroxime,   Cephalexin, Loracarbef *Parenteral cephems, such as:   Cefazolin , Cefepime, Cefotaxime, Cefotetan, Ceftaroline,   Ceftizoxime, Ceftriaxone, Cefuroxime *Carbapenems, such as:   Doripenem, Ertapenem, Imipenem, Meropenem Light  growth    Antimicrobial Susceptibility Comment  Final    Comment:       ** S = Susceptible; I = Intermediate; R = Resistant **                    P = Positive; N = Negative             MICS are expressed in micrograms per mL    Antibiotic                 RSLT#1    RSLT#2    RSLT#3    RSLT#4 Ciprofloxacin                  S Clindamycin                     S Erythromycin                   S Gentamicin                      S Levofloxacin                   S Linezolid                       S Moxifloxacin                   S Oxacillin                      S Penicillin                     R Rifampin                       S Tetracycline                   R Trimethoprim/Sulfa             S Vancomycin                      S   Blood culture (routine x 2)     Status: None   Collection Time: 07/24/24  4:09 PM   Specimen: BLOOD  Result Value Ref Range Status   Specimen Description BLOOD RIGHT ARM  Final    Special Requests   Final    BOTTLES DRAWN AEROBIC AND ANAEROBIC Blood Culture adequate volume   Culture   Final    NO GROWTH 5 DAYS Performed at [facility], [address] [place], [place] [address]    Report Status 07/29/2024 FINAL  Final  Blood culture (routine x 2)     Status: None (Preliminary result)   Collection Time: 07/24/24 10:51 PM   Specimen: BLOOD  Result Value Ref Range Status   Specimen Description BLOOD LEFT ANTECUBITAL  Final   Special Requests   Final    BOTTLES DRAWN AEROBIC AND ANAEROBIC Blood Culture adequate volume   Culture   Final    NO GROWTH 4 DAYS Performed at [facility], [address], [place], [place] [address]    Report Status PENDING  Incomplete         Radiology Studies: CT Angio Abd/Pel w/ and/or w/o Result Date: 07/28/2024 CLINICAL DATA:  Femoral end arterectomy with patch on left side assess for infection EXAM: CTA ABDOMEN AND  PELVIS WITHOUT AND WITH CONTRAST TECHNIQUE: Multidetector CT imaging of the abdomen and pelvis was performed using the standard protocol during bolus administration of intravenous contrast. Multiplanar reconstructed images and MIPs were obtained and reviewed to evaluate the vascular anatomy. RADIATION DOSE REDUCTION: This exam was performed according to the departmental dose-optimization program which includes automated exposure control, adjustment of the mA and/or kV according to patient size and/or use of iterative reconstruction technique. CONTRAST:  OMNIPAQUE  IOHEXOL  350 MG/ML SOLN COMPARISON:  None Available. FINDINGS: VASCULAR Aorta: No acute intramural hematoma is visualized. Advanced atherosclerosis of the aorta. No dissection is seen. Multifocal ectasia without definitive aneurysm. Moderate infrarenal mural thrombus, results in moderate luminal narrowing of the aorta. Celiac: Patent without evidence of aneurysm, dissection, vasculitis or significant stenosis. SMA: Appears patent. Mild luminal  irregular narrowing of the proximal vessel but patent distally. Renals: 2 left and single right renal arteries. Dominant superior left renal artery is patent and without significant stenosis, dissection or aneurysm. Accessory lower pole left renal artery demonstrates suspected moderate severe focal stenosis just distal to the origin, series 11, image 46. Right renal artery is heavily calcified at the origin suspect moderate stenosis of the proximal right renal artery. IMA: Diminutive but patent. Inflow: Advanced atherosclerotic disease. Bilateral external iliac artery stents are patent. Moderate severe diffuse disease of the internal iliac vessels. Proximal Outflow: Right common femoral artery appears patent. Moderate severe stenosis of the proximal right superficial femoral artery, just proximal to the stent/at the anastomosis. Partially visualized SFA stent appears patent. Left common femoral artery is patent. Deep femoral artery is patent. Included superficial femoral artery is patent as is a partially visualized SFA stent. Veins: Patent portal and splenic veins.  The IVC is unremarkable Review of the MIP images confirms the above findings. NON-VASCULAR Lower chest: Lung bases demonstrate no acute airspace disease. Coronary vascular calcification. Hepatobiliary: Cholecystectomy. Slightly enlarged common bile duct measuring up to 10 mm, likely due to surgical change Pancreas: Atrophic.  No inflammation Spleen: Normal in size without focal abnormality. Adrenals/Urinary Tract: Adrenal glands are normal. Kidneys show no hydronephrosis. Cysts and subcentimeter hypodensities too small to further characterize, no imaging follow-up recommended. Bladder is normal Stomach/Bowel: Stomach within normal limits. No dilated small bowel. No acute bowel wall thickening. Diverticular disease of the left colon Lymphatic: No suspicious lymph nodes Reproductive: Atrophic uterus. Prominent parauterine vessels. No suspicious adnexal  mass. Other: No ascites or free air Musculoskeletal: Skin thickening in the left groin with subcutaneous soft tissue stranding. Large rim enhancing fluid collection within the left groin and proximal thigh. Fluid collection is seen anterior to the left common femoral artery and extend inferiorly into the thigh subcutaneous soft tissues. The collection measures about 6 x 5.3 cm on series 13, image 77, with approximate craniocaudal measurement of 14 cm on series 15, image 42. No internal gas bubbles but positive for surrounding soft tissue stranding. Artifact from intramedullary rod in the left femur. Retrolisthesis L1 on L2 with advanced degenerative changes. No acute osseous abnormality. IMPRESSION: 1. Large rim enhancing fluid collection within the left groin and proximal thigh, anterior to the left common femoral artery and extending inferiorly into the subcutaneous soft tissues of the anterior, proximal thigh. Collection measures about 6 x 5.3 x 14 cm. No internal gas bubbles but positive for surrounding soft tissue stranding. Findings could represent postoperative fluid collection versus inflammatory or infected fluid collection. 2. Advanced aortic atherosclerosis without aneurysm or dissection. Status post stents within the external iliac arteries and  bilateral superficial femoral arteries with stent patency of the external iliac stents and included portion of the SFA stents. Patent bilateral common femoral arteries with suspected moderate severe stenosis of the right SFA just proximal to the stent/at the anastomosis 3. No CT evidence for acute intra-abdominal or pelvic abnormality. 4. Diverticular disease of the left colon without acute inflammatory process. 5. Aortic atherosclerosis. Aortic Atherosclerosis (ICD10-I70.0). Electronically Signed   By: Luke Bun M.D.   On: 07/28/2024 23:31         Scheduled Meds:  aspirin  EC  81 mg Oral Daily   atenolol   100 mg Oral Daily   chlorthalidone   25 mg  Oral Daily   collagenase    Topical Daily   enoxaparin  (LOVENOX ) injection  40 mg Subcutaneous QHS   gabapentin   300 mg Oral Daily   linezolid   600 mg Oral Q12H   Continuous Infusions:  sodium chloride  75 mL/hr at 07/29/24 0042     LOS: 5 days       Megan CHRISTELLA Pouch, MD [facility] Pager [phone]  If 7PM-7AM, please contact night-coverage [email or web address] 07/29/2024, 9:13 AM   "

## 2024-07-29 NOTE — Progress Notes (Signed)
 "  Date of Admission:  07/24/2024     ID: Megan Irwin is a 79 y.o. female Principal Problem:   Wound infection Active Problems:   Dyslipidemia   Essential hypertension   Generalized anxiety disorder   Restless legs syndrome (RLS)   Ischemic leg   MSSA (methicillin susceptible Staphylococcus aureus) infection   Postoperative infection   AKI (acute kidney injury)   PAD (peripheral artery disease)    Subjective: Pt doing okay Rash spreading over her abdomen Some itch- benadryl  helped Dr.Dew aspirated 45 cc of straw colored fluid   Medications:   aspirin  EC  81 mg Oral Daily   atenolol   100 mg Oral Daily   chlorthalidone   25 mg Oral Daily   collagenase    Topical Daily   enoxaparin  (LOVENOX ) injection  40 mg Subcutaneous QHS   gabapentin   300 mg Oral Daily   linezolid   600 mg Oral Q12H    Objective: Vital signs in last 24 hours: Patient Vitals for the past 24 hrs:  BP Temp Temp src Pulse Resp SpO2  07/29/24 0744 (!) 149/78 98.1 F (36.7 C) Oral 70 18 98 %  07/29/24 0743 (!) 160/79 98.2 F (36.8 C) Oral 71 18 100 %  07/29/24 0503 102/87 97.7 F (36.5 C) -- 68 17 97 %  07/28/24 2010 (!) 133/50 97.7 F (36.5 C) -- 100 18 91 %  07/28/24 1603 (!) 147/54 97.9 F (36.6 C) -- 70 18 100 %  07/28/24 0956 (!) 131/57 97.6 F (36.4 C) Oral 76 18 100 %       PHYSICAL EXAM:  General: Alert, cooperative, no distress, appears stated age.  Lungs: Clear to auscultation bilaterally. No Wheezing or Rhonchi. No rales. Heart: Regular rate and rhythm, no murmur, rub or gallop. Abdomen: Soft, non-tender,not distended. Bowel sounds normal. No masses Extremities: atraumatic, no cyanosis. No edema. No clubbing Skin: left leg erythematous rash Left inguinal erythema Left hip and back erythematous macular /papular rash Abdomen erythematous maculopapular rash  Lymph: Cervical, supraclavicular normal. Neurologic: Grossly non-focal  Lab Results    Latest Ref Rng & Units 07/29/2024     6:13 AM 07/28/2024    6:23 AM 07/27/2024    5:53 AM  CBC  WBC 4.0 - 10.5 K/uL 13.5  10.9  10.8   Hemoglobin 12.0 - 15.0 g/dL 9.3  9.5  89.9   Hematocrit 36.0 - 46.0 % 30.4  30.1  31.7   Platelets 150 - 400 K/uL 317  272  298        Latest Ref Rng & Units 07/29/2024    6:13 AM 07/28/2024    6:23 AM 07/27/2024    5:53 AM  CMP  Glucose 70 - 99 mg/dL 83  892  91   BUN 8 - 23 mg/dL 18  20  18    Creatinine 0.44 - 1.00 mg/dL 9.06  8.84  9.05   Sodium 135 - 145 mmol/L 140  140  139   Potassium 3.5 - 5.1 mmol/L 4.0  4.5  4.2   Chloride 98 - 111 mmol/L 108  106  104   CO2 22 - 32 mmol/L 25  26  26    Calcium  8.9 - 10.3 mg/dL 9.0  9.3  9.6       Microbiology: 07/21/24 WC left groin MSSA Studies/Results:  CTA seroma left femoral area No deep involvement    Assessment/Plan: Purulent cellulitis with soft tissue infection at the left femoral surgical site Concern for deeper vascular infection  as she recently had endarterectomy and patch angioplasty of left common femoral and superficial femoral artery   CT angio- collection in the femoral area   Pt was  on cefazolin  Drug rash hence stopped started PO linezolid  yesterday S/p aspiration of seroma and culture sent  As no deep involvement of the femoral arterial patch   she will not need IV antibiotics or prolonged duration. Will do linezolid  600mg  PO BID for 2 weeks with monitoring CBC with diff/CMP after a week   Leucocytosis due to infection   PAD   AKI   HTN on atenolol , chlorthalidone    Discussed the management with patient, hospitalist and vascular team and ID pharmacist   "

## 2024-07-29 NOTE — Care Management Important Message (Signed)
 Important Message  Patient Details  Name: Megan Irwin MRN: [id number] Date of Birth: 1945-09-04   Important Message Given:  Yes - Medicare IM     Damichael Hofman W, CMA 07/29/2024, 12:01 PM

## 2024-07-29 NOTE — Op Note (Addendum)
 Ultrasound guidance was used to visualize the left groin seroma. She has a simple postoperative seroma without evidence of deep tissue or endovascular infection.  It was then anesthetized and accessed with a 18 gauge needle under direct ultrasound guidance.  Approximately 45 cc of straw to clear fluid was removed without difficulty.  Some of this specimen was sent off for culture.  Pressure was held and a dressing was placed after aspirating the fluid.

## 2024-07-29 NOTE — Plan of Care (Signed)

## 2024-07-30 ENCOUNTER — Other Ambulatory Visit: Payer: Self-pay

## 2024-07-30 ENCOUNTER — Encounter (INDEPENDENT_AMBULATORY_CARE_PROVIDER_SITE_OTHER)

## 2024-07-30 ENCOUNTER — Encounter: Payer: Self-pay | Admitting: Vascular Surgery

## 2024-07-30 ENCOUNTER — Telehealth (INDEPENDENT_AMBULATORY_CARE_PROVIDER_SITE_OTHER): Payer: Self-pay

## 2024-07-30 ENCOUNTER — Ambulatory Visit (INDEPENDENT_AMBULATORY_CARE_PROVIDER_SITE_OTHER): Admitting: Nurse Practitioner

## 2024-07-30 DIAGNOSIS — I739 Peripheral vascular disease, unspecified: Secondary | ICD-10-CM | POA: Diagnosis not present

## 2024-07-30 DIAGNOSIS — T148XXA Other injury of unspecified body region, initial encounter: Secondary | ICD-10-CM | POA: Diagnosis not present

## 2024-07-30 DIAGNOSIS — T361X5A Adverse effect of cephalosporins and other beta-lactam antibiotics, initial encounter: Secondary | ICD-10-CM | POA: Diagnosis not present

## 2024-07-30 DIAGNOSIS — L089 Local infection of the skin and subcutaneous tissue, unspecified: Secondary | ICD-10-CM | POA: Diagnosis not present

## 2024-07-30 DIAGNOSIS — T8140XA Infection following a procedure, unspecified, initial encounter: Secondary | ICD-10-CM | POA: Diagnosis not present

## 2024-07-30 DIAGNOSIS — L270 Generalized skin eruption due to drugs and medicaments taken internally: Secondary | ICD-10-CM | POA: Diagnosis not present

## 2024-07-30 DIAGNOSIS — D72829 Elevated white blood cell count, unspecified: Secondary | ICD-10-CM | POA: Diagnosis not present

## 2024-07-30 LAB — CBC
HCT: 32.4 % — ABNORMAL LOW (ref 36.0–46.0)
Hemoglobin: 10 g/dL — ABNORMAL LOW (ref 12.0–15.0)
MCH: 28.5 pg (ref 26.0–34.0)
MCHC: 30.9 g/dL (ref 30.0–36.0)
MCV: 92.3 fL (ref 80.0–100.0)
Platelets: 359 K/uL (ref 150–400)
RBC: 3.51 MIL/uL — ABNORMAL LOW (ref 3.87–5.11)
RDW: 13.9 % (ref 11.5–15.5)
WBC: 12.7 K/uL — ABNORMAL HIGH (ref 4.0–10.5)
nRBC: 0 % (ref 0.0–0.2)

## 2024-07-30 LAB — CULTURE, BLOOD (ROUTINE X 2)
Culture: NO GROWTH
Special Requests: ADEQUATE

## 2024-07-30 MED ORDER — INFLUENZA VIRUS VACC SPLIT PF (FLUZONE) 0.5 ML IM SUSY
0.5000 mL | PREFILLED_SYRINGE | Freq: Once | INTRAMUSCULAR | Status: AC
  Administered 2024-07-30: 0.5 mL via INTRAMUSCULAR
  Filled 2024-07-30: qty 0.5

## 2024-07-30 MED ORDER — ASPIRIN 81 MG PO TBEC
325.0000 mg | DELAYED_RELEASE_TABLET | Freq: Every day | ORAL | 12 refills | Status: DC
  Filled 2024-07-30: qty 30, 7d supply, fill #0

## 2024-07-30 MED ORDER — LINEZOLID 600 MG PO TABS
600.0000 mg | ORAL_TABLET | Freq: Two times a day (BID) | ORAL | 0 refills | Status: AC
  Filled 2024-07-30: qty 28, 14d supply, fill #0

## 2024-07-30 MED ORDER — INFLUENZA VIRUS VACC SPLIT PF (FLUZONE) 0.5 ML IM SUSY: 0.5000 mL | PREFILLED_SYRINGE | INTRAMUSCULAR | Status: DC

## 2024-07-30 MED ORDER — INFLUENZA VIRUS VACC SPLIT PF (FLUZONE) 0.5 ML IM SUSY: 0.5000 mL | PREFILLED_SYRINGE | Freq: Once | INTRAMUSCULAR | Status: DC

## 2024-07-30 MED ORDER — ASPIRIN 325 MG PO TBEC
325.0000 mg | DELAYED_RELEASE_TABLET | Freq: Every day | ORAL | 5 refills | Status: AC
  Filled 2024-07-30: qty 30, 30d supply, fill #0

## 2024-07-30 NOTE — Progress Notes (Signed)
 Discharge criteria met

## 2024-07-30 NOTE — Progress Notes (Signed)
 "  Date of Admission:  07/24/2024     ID: Megan Irwin is a 79 y.o. female Principal Problem:   Wound infection Active Problems:   Dyslipidemia   Essential hypertension   Generalized anxiety disorder   Restless legs syndrome (RLS)   Ischemic leg   MSSA (methicillin susceptible Staphylococcus aureus) infection   Postoperative infection   AKI (acute kidney injury)   PAD (peripheral artery disease)    Subjective: Pt doing better Rash over abdomen Les sitchy Left femoral area feels better    Medications:   aspirin  EC  81 mg Oral Daily   atenolol   100 mg Oral Daily   chlorthalidone   25 mg Oral Daily   collagenase    Topical Daily   enoxaparin  (LOVENOX ) injection  40 mg Subcutaneous QHS   gabapentin   300 mg Oral Daily   linezolid   600 mg Oral Q12H    Objective: Vital signs in last 24 hours: Patient Vitals for the past 24 hrs:  BP Temp Temp src Pulse Resp SpO2  07/30/24 0818 (!) 140/61 98.2 F (36.8 C) -- 63 16 100 %  07/30/24 0518 (!) 93/34 97.7 F (36.5 C) -- (!) 55 17 100 %  07/29/24 2047 (!) 130/54 97.9 F (36.6 C) -- 72 16 100 %  07/29/24 1613 (!) 141/54 98.7 F (37.1 C) Oral 92 18 94 %  07/29/24 1324 100/87 97.6 F (36.4 C) -- (!) 59 17 97 %  07/29/24 1150 (!) 153/65 (!) 97.5 F (36.4 C) Oral -- -- --       PHYSICAL EXAM:  General: Alert, cooperative, no distress, appears stated age.  Lungs: Clear to auscultation bilaterally. No Wheezing or Rhonchi. No rales. Heart: Regular rate and rhythm, no murmur, rub or gallop. Abdomen: Soft, non-tender,not distended. Bowel sounds normal. No masses :  skin: left leg erythematous rash Left inguinal erythema left hip and back erythematous macular /papular rash Abdomen erythematous maculopapular rash       Lymph: Cervical, supraclavicular normal. Neurologic: Grossly non-focal  Lab Results    Latest Ref Rng & Units 07/30/2024    3:09 AM 07/29/2024    6:13 AM 07/29/2024    6:11 AM  CBC  WBC 4.0 - 10.5 K/uL  12.7  13.5  13.4   Hemoglobin 12.0 - 15.0 g/dL 89.9  9.3  9.3   Hematocrit 36.0 - 46.0 % 32.4  30.4  30.1   Platelets 150 - 400 K/uL 359  317  325        Latest Ref Rng & Units 07/29/2024    6:13 AM 07/28/2024    6:23 AM 07/27/2024    5:53 AM  CMP  Glucose 70 - 99 mg/dL 83  892  91   BUN 8 - 23 mg/dL 18  20  18    Creatinine 0.44 - 1.00 mg/dL 9.06  8.84  9.05   Sodium 135 - 145 mmol/L 140  140  139   Potassium 3.5 - 5.1 mmol/L 4.0  4.5  4.2   Chloride 98 - 111 mmol/L 108  106  104   CO2 22 - 32 mmol/L 25  26  26    Calcium  8.9 - 10.3 mg/dL 9.0  9.3  9.6       Microbiology: 07/21/24 WC left groin MSSA Studies/Results:  CTA seroma left femoral area     Assessment/Plan: Purulent cellulitis with soft tissue infection at the left femoral surgical site There was Concern for deeper vascular infection as she recently had endarterectomy  and patch angioplasty of left common femoral and superficial femoral artery  but Ctdid not show that CT angio- collection in the femoral area   Pt was  on cefazolin  Drug rash hence stopped started PO linezolid  S/p aspiration of seroma and culture sent  As no deep involvement of the femoral arterial patch   she will not need IV antibiotics or prolonged duration. Will do linezolid  600mg  PO BID for 2 weeks with monitoring CBC with diff/CMP after a week. She wants to follow up with her PCP and [facility] and get labs there  She was given material regarding linezoid and side effects were explained Leucocytosis due to infection   PAD   AKI   HTN on atenolol , chlorthalidone    Discussed the management with patient, hospitalist and vascular team and ID pharmacist   "

## 2024-07-30 NOTE — Telephone Encounter (Signed)
 Pharmacy called at this time in reference to santyl  for patient stating that per patients wound size she would need 990g for a 30 day supply, her insurance will only approve 1/5 of the supply for $1036.56 out of pocket. Spoke to NP and she stated to hold off on prior-Auth at this time because patient is currently  hospitalized.   Spoke with Pua at the pharmacy and made her aware, she said office can touch base when the prior-Auth is needed and she would also push it out for a week or two and circle back around.

## 2024-07-30 NOTE — Discharge Summary (Signed)
 Fredrika Canby Shoults FMW:[id number] DOB: 02/10/1946 DOA: 07/24/2024  PCP: Delfina Pao, MD  Admit date: 07/24/2024 Discharge date: 07/30/2024  Time spent: 35 minutes  Recommendations for Outpatient Follow-up:  Vascular surgery f/u 1 week. Cbc with diff and cmp then Pcp f/u as well     Discharge Diagnoses:  Principal Problem:   Wound infection Active Problems:   Dyslipidemia   Essential hypertension   Generalized anxiety disorder   Restless legs syndrome (RLS)   Ischemic leg   MSSA (methicillin susceptible Staphylococcus aureus) infection   Postoperative infection   AKI (acute kidney injury)   PAD (peripheral artery disease)   Discharge Condition: stable  Diet recommendation: heart healthy  Filed Weights   07/24/24 1208  Weight: 53.1 kg    History of present illness:  From admission h and p  BETTA BALLA is a pleasant 79 y.o. female with medical history significant for HTN, HLD, GERD, PAD s/p s/p critical limb ischemia LLE, s/p  Left common femoral and superficial femoral artery endarterectomies and patch angioplasty  on 06/27/2024 who came in to ED for left groin nonhealing wound after vascular procedure.  Patient stated that she noticed some redness and worsening pain in her incision site.  She went to primary care doctor office 3 days ago where wound culture was done and was started on antibiotic doxycycline .  Wound culture came back positive for Staph aureus and resistant to oral antibiotics.  Patient was advised to go to the emergency room for evaluation.  Patient denied any fever, chills, nausea, vomiting, diarrhea, chest pain, palpitations.  However, patient stated that she had decreased oral intake and was not able to eat and drink well.   Hospital Course:   Hx left groin vascular procedure one month ago, developed surgical site infection treated as outpatient with doxy/clinda, symptoms worsened so presented here. Fluid collection seen on imaging, vascular aspirated,  appears to be a seroma, culture ngtd. Outpt superficial swab culture growing mssa. Developed drug rash with cefazolin  so this stopped and switched to linezolid , ID advises 2 more weeks of that with cbc with diff and cmp in one week, vascular to obtain. Cleared for d/c by both, vascular to f/u in one week. Symptomatically improving and feeling well, pleased to discharge today.   Procedures: Aspiration of seroma left groin 1/13 by vascular surgery   Consultations: Vascular ID  Discharge Exam: Vitals:   07/30/24 0518 07/30/24 0818  BP: (!) 93/34 (!) 140/61  Pulse: (!) 55 63  Resp: 17 16  Temp: 97.7 F (36.5 C) 98.2 F (36.8 C)  SpO2: 100% 100%    General: NAD Cardiovascular: RRR Respiratory: CTAB Skin: maculopapular rash torso and lower extremities. Swelling left groin, mild tenderness  Discharge Instructions   Discharge Instructions     Discharge wound care:   Complete by: As directed    Cleanse the wound with Vashe #[id number], nor rinse and allow to dry. Apply Santyl  to the wound bed, cover with a moistened gauze. Top with foam dressing or ABD pad. Change daily.   Increase activity slowly   Complete by: As directed       Allergies as of 07/30/2024       Reactions   Calcium  Channel Blockers Shortness Of Breath, Anxiety, Palpitations, Other (See Comments)   Cognitive effects also--difficulty thinking   Hydrochlorothiazide Shortness Of Breath   Sodium Metabisulfite Anaphylaxis   Sulfite--in foods   Sulfites Swelling   Can't breathe   Telmisartan-hctz Shortness Of Breath   Chest  pain & fatigue    Cardizem [diltiazem Hcl] Other (See Comments)   Difficulty eating, sleeping, and concentrating Heart goes crazy   Sulfa Antibiotics Hives   Acyclovir And Related    Apixaban  Nausea Only   Plavix  [clopidogrel ]    Lexapro [escitalopram Oxalate] Other (See Comments)   Gi upset        Medication List     STOP taking these medications    clindamycin  300 MG  capsule Commonly known as: Cleocin    doxycycline  100 MG capsule Commonly known as: VIBRAMYCIN    rivaroxaban  2.5 MG Tabs tablet Commonly known as: XARELTO        TAKE these medications    aspirin  EC 325 MG tablet Take 1 tablet (325 mg total) by mouth daily. Swallow whole. What changed:  medication strength how much to take   atenolol  100 MG tablet Commonly known as: TENORMIN  Take 100 mg by mouth daily. In the morning   atorvastatin  20 MG tablet Commonly known as: LIPITOR Take 1 tablet (20 mg total) by mouth daily at 6 PM. Patient only takes half a tablet most times   Calcium  Magnesium  Zinc  333-133-5 MG Tabs Take 1 tablet by mouth daily.   chlorthalidone  25 MG tablet Commonly known as: HYGROTON  Take 25 mg by mouth daily.   gabapentin  300 MG capsule Commonly known as: NEURONTIN  Take 300 mg by mouth daily.   linezolid  600 MG tablet Commonly known as: ZYVOX  Take 1 tablet (600 mg total) by mouth every 12 (twelve) hours for 14 days.   SUPER B COMPLEX PO Take 1 capsule by mouth daily.   tizanidine  2 MG capsule Commonly known as: ZANAFLEX  Take 2 mg by mouth at bedtime as needed for muscle spasms. Take a 1/2 tablet every night   traMADol  50 MG tablet Commonly known as: ULTRAM  Take 1 tablet (50 mg total) by mouth every 6 (six) hours as needed.   valsartan 160 MG tablet Commonly known as: DIOVAN Take 160 mg by mouth daily.               Discharge Care Instructions  (From admission, onward)           Start     Ordered   07/30/24 0000  Discharge wound care:       Comments: Cleanse the wound with Vashe #[id number], nor rinse and allow to dry. Apply Santyl  to the wound bed, cover with a moistened gauze. Top with foam dressing or ABD pad. Change daily.   07/30/24 1559           Allergies[1]  Contact information for follow-up providers     Delfina Pao, MD Follow up.   Specialty: Pediatrics Contact information: [address] [place]  [address] [phone]         Delores Orvin BRAVO, NP Follow up.   Specialty: Vascular Surgery Why: 1 week Contact information: [address] [place] [address] [phone]              Contact information for after-discharge care     Home Medical Care     [facility] .   Service: Home Health Services Contact information: [phone] [place]  [address] [phone]                      The results of significant diagnostics from this hospitalization (including imaging, microbiology, ancillary and laboratory) are listed below for reference.    Significant Diagnostic  Studies: PERIPHERAL VASCULAR CATHETERIZATION Result Date: 07/29/2024 See surgical note for result.  CT Angio Abd/Pel w/ and/or w/o Result Date: 07/28/2024 CLINICAL DATA:  Femoral end arterectomy with patch on left side assess for infection EXAM: CTA ABDOMEN AND PELVIS WITHOUT AND WITH CONTRAST TECHNIQUE: Multidetector CT imaging of the abdomen and pelvis was performed using the standard protocol during bolus administration of intravenous contrast. Multiplanar reconstructed images and MIPs were obtained and reviewed to evaluate the vascular anatomy. RADIATION DOSE REDUCTION: This exam was performed according to the departmental dose-optimization program which includes automated exposure control, adjustment of the mA and/or kV according to patient size and/or use of iterative reconstruction technique. CONTRAST:  OMNIPAQUE  IOHEXOL  350 MG/ML SOLN COMPARISON:  None Available. FINDINGS: VASCULAR Aorta: No acute intramural hematoma is visualized. Advanced atherosclerosis of the aorta. No dissection is seen. Multifocal ectasia without definitive aneurysm. Moderate infrarenal mural thrombus, results in moderate luminal narrowing of the aorta. Celiac: Patent without evidence of aneurysm, dissection, vasculitis or significant stenosis. SMA: Appears patent. Mild  luminal irregular narrowing of the proximal vessel but patent distally. Renals: 2 left and single right renal arteries. Dominant superior left renal artery is patent and without significant stenosis, dissection or aneurysm. Accessory lower pole left renal artery demonstrates suspected moderate severe focal stenosis just distal to the origin, series 11, image 46. Right renal artery is heavily calcified at the origin suspect moderate stenosis of the proximal right renal artery. IMA: Diminutive but patent. Inflow: Advanced atherosclerotic disease. Bilateral external iliac artery stents are patent. Moderate severe diffuse disease of the internal iliac vessels. Proximal Outflow: Right common femoral artery appears patent. Moderate severe stenosis of the proximal right superficial femoral artery, just proximal to the stent/at the anastomosis. Partially visualized SFA stent appears patent. Left common femoral artery is patent. Deep femoral artery is patent. Included superficial femoral artery is patent as is a partially visualized SFA stent. Veins: Patent portal and splenic veins.  The IVC is unremarkable Review of the MIP images confirms the above findings. NON-VASCULAR Lower chest: Lung bases demonstrate no acute airspace disease. Coronary vascular calcification. Hepatobiliary: Cholecystectomy. Slightly enlarged common bile duct measuring up to 10 mm, likely due to surgical change Pancreas: Atrophic.  No inflammation Spleen: Normal in size without focal abnormality. Adrenals/Urinary Tract: Adrenal glands are normal. Kidneys show no hydronephrosis. Cysts and subcentimeter hypodensities too small to further characterize, no imaging follow-up recommended. Bladder is normal Stomach/Bowel: Stomach within normal limits. No dilated small bowel. No acute bowel wall thickening. Diverticular disease of the left colon Lymphatic: No suspicious lymph nodes Reproductive: Atrophic uterus. Prominent parauterine vessels. No suspicious  adnexal mass. Other: No ascites or free air Musculoskeletal: Skin thickening in the left groin with subcutaneous soft tissue stranding. Large rim enhancing fluid collection within the left groin and proximal thigh. Fluid collection is seen anterior to the left common femoral artery and extend inferiorly into the thigh subcutaneous soft tissues. The collection measures about 6 x 5.3 cm on series 13, image 77, with approximate craniocaudal measurement of 14 cm on series 15, image 42. No internal gas bubbles but positive for surrounding soft tissue stranding. Artifact from intramedullary rod in the left femur. Retrolisthesis L1 on L2 with advanced degenerative changes. No acute osseous abnormality. IMPRESSION: 1. Large rim enhancing fluid collection within the left groin and proximal thigh, anterior to the left common femoral artery and extending inferiorly into the subcutaneous soft tissues of the anterior, proximal thigh. Collection measures about 6 x 5.3 x 14 cm.  No internal gas bubbles but positive for surrounding soft tissue stranding. Findings could represent postoperative fluid collection versus inflammatory or infected fluid collection. 2. Advanced aortic atherosclerosis without aneurysm or dissection. Status post stents within the external iliac arteries and bilateral superficial femoral arteries with stent patency of the external iliac stents and included portion of the SFA stents. Patent bilateral common femoral arteries with suspected moderate severe stenosis of the right SFA just proximal to the stent/at the anastomosis 3. No CT evidence for acute intra-abdominal or pelvic abnormality. 4. Diverticular disease of the left colon without acute inflammatory process. 5. Aortic atherosclerosis. Aortic Atherosclerosis (ICD10-I70.0). Electronically Signed   By: Luke Bun M.D.   On: 07/28/2024 23:31   ECHOCARDIOGRAM COMPLETE Result Date: 07/25/2024    ECHOCARDIOGRAM REPORT   Patient Name:   NORITA MEIGS  Date of Exam: 07/25/2024 Medical Rec #:  [id number]       Height:       63.0 in Accession #:    [id number]      Weight:       117.0 lb Date of Birth:  04/04/1946       BSA:          1.540 m Patient Age:    78 years        BP:           145/56 mmHg Patient Gender: F               HR:           73 bpm. Exam Location:  [facility] Procedure: 2D Echo, Color Doppler and Cardiac Doppler (Both Spectral and Color            Flow Doppler were utilized during procedure). Indications:     Chest Pain R07.9  History:         Patient has no prior history of Echocardiogram examinations.                  Risk Factors:Hypertension. Anxiety.  Sonographer:     Christopher Furnace Referring Phys:  [id number] ANTHONY CHRISTELLA POUCH Diagnosing Phys: Cara JONETTA Lovelace MD IMPRESSIONS  1. Left ventricular ejection fraction, by estimation, is 60 to 65%. The left ventricle has normal function. The left ventricle has no regional wall motion abnormalities. Left ventricular diastolic parameters are consistent with Grade I diastolic dysfunction (impaired relaxation).  2. Right ventricular systolic function is normal. The right ventricular size is normal.  3. The mitral valve is grossly normal. Moderate mitral valve regurgitation.  4. The aortic valve is normal in structure. Aortic valve regurgitation is moderate. Aortic valve sclerosis is present, with no evidence of aortic valve stenosis. FINDINGS  Left Ventricle: Left ventricular ejection fraction, by estimation, is 60 to 65%. The left ventricle has normal function. The left ventricle has no regional wall motion abnormalities. Strain was performed and the global longitudinal strain is indeterminate. The left ventricular internal cavity size was normal in size. There is no left ventricular hypertrophy. Left ventricular diastolic parameters are consistent with Grade I diastolic dysfunction (impaired relaxation). Right Ventricle: The right ventricular size is normal. No increase in right ventricular wall thickness. Right  ventricular systolic function is normal. Left Atrium: Left atrial size was normal in size. Right Atrium: Right atrial size was normal in size. Pericardium: There is no evidence of pericardial effusion. Mitral Valve: The mitral valve is grossly normal. Moderate mitral valve regurgitation. MV peak gradient, 3.4 mmHg. The mean mitral valve gradient is 2.0  mmHg. Tricuspid Valve: The tricuspid valve is normal in structure. Tricuspid valve regurgitation is trivial. Aortic Valve: The aortic valve is normal in structure. Aortic valve regurgitation is moderate. Aortic valve sclerosis is present, with no evidence of aortic valve stenosis. Aortic valve mean gradient measures 1.5 mmHg. Aortic valve peak gradient measures  3.3 mmHg. Aortic valve area, by VTI measures 2.59 cm. Pulmonic Valve: The pulmonic valve was not well visualized. Pulmonic valve regurgitation is not visualized. Aorta: The aortic root was not well visualized. IAS/Shunts: No atrial level shunt detected by color flow Doppler. Additional Comments: 3D was performed not requiring image post processing on an independent workstation and was indeterminate.  LEFT VENTRICLE PLAX 2D LVIDd:         4.26 cm LVIDs:         2.79 cm LV PW:         1.51 cm LV IVS:        1.46 cm LVOT diam:     2.00 cm LV SV:         42 LV SV Index:   27 LVOT Area:     3.14 cm LV IVRT:       112 msec  RIGHT VENTRICLE RV Basal diam:  3.68 cm RV Mid diam:    3.27 cm LEFT ATRIUM         Index LA diam:    3.70 cm 2.40 cm/m  AORTIC VALVE AV Area (Vmax):    2.45 cm AV Area (Vmean):   2.74 cm AV Area (VTI):     2.59 cm AV Vmax:           90.90 cm/s AV Vmean:          58.200 cm/s AV VTI:            0.162 m AV Peak Grad:      3.3 mmHg AV Mean Grad:      1.5 mmHg LVOT Vmax:         70.90 cm/s LVOT Vmean:        50.700 cm/s LVOT VTI:          0.133 m LVOT/AV VTI ratio: 0.82  AORTA Ao Root diam: 2.70 cm MITRAL VALVE               TRICUSPID VALVE MV Area (PHT): 2.93 cm    TR Peak grad:   78.9 mmHg  MV Area VTI:   1.60 cm    TR Vmax:        444.00 cm/s MV Peak grad:  3.4 mmHg MV Mean grad:  2.0 mmHg    SHUNTS MV Vmax:       0.92 m/s    Systemic VTI:  0.13 m MV Vmean:      57.5 cm/s   Systemic Diam: 2.00 cm MV Decel Time: 259 msec MV E velocity: 74.70 cm/s MV A velocity: 89.20 cm/s MV E/A ratio:  0.84 Dwayne D Callwood MD Electronically signed by Cara JONETTA Lovelace MD Signature Date/Time: 07/25/2024/6:05:57 PM    Final    DG Chest 2 View Result Date: 07/24/2024 CLINICAL DATA:  Weakness for several days. EXAM: CHEST - 2 VIEW COMPARISON:  None Available. FINDINGS: The heart size and mediastinal contours are within normal limits. Very mild areas of scarring, atelectasis and/or infiltrate are seen along the periphery of the right upper lobe and left lung base. No pleural effusion or pneumothorax is identified. Multilevel degenerative changes seen throughout the thoracic spine and visualized portion  of the upper lumbar spine. IMPRESSION: Very mild right upper lobe and left basilar scarring, atelectasis and/or infiltrate. Electronically Signed   By: Suzen Dials M.D.   On: 07/24/2024 12:53    Microbiology: Recent Results (from the past 240 hours)  Aerobic culture     Status: Abnormal   Collection Time: 07/21/24  2:00 PM  Result Value Ref Range Status   Aerobic Bacterial Culture Final report (A)  Final   Organism ID, Bacteria Staphylococcus aureus (A)  Final    Comment: Based on susceptibility to oxacillin this isolate would be susceptible to: *Penicillinase-stable penicillins, such as:   Cloxacillin, Dicloxacillin, Nafcillin *Beta-lactam combination agents, such as:   Amoxicillin-clavulanic acid, Ampicillin-sulbactam,   Piperacillin-tazobactam *Oral cephems, such as:   Cefaclor, Cefdinir, Cefpodoxime, Cefprozil, Cefuroxime,   Cephalexin, Loracarbef *Parenteral cephems, such as:   Cefazolin , Cefepime, Cefotaxime, Cefotetan, Ceftaroline,   Ceftizoxime, Ceftriaxone, Cefuroxime *Carbapenems,  such as:   Doripenem, Ertapenem, Imipenem, Meropenem Light growth    Antimicrobial Susceptibility Comment  Final    Comment:       ** S = Susceptible; I = Intermediate; R = Resistant **                    P = Positive; N = Negative             MICS are expressed in micrograms per mL    Antibiotic                 RSLT#1    RSLT#2    RSLT#3    RSLT#4 Ciprofloxacin                  S Clindamycin                     S Erythromycin                   S Gentamicin                      S Levofloxacin                   S Linezolid                       S Moxifloxacin                   S Oxacillin                      S Penicillin                     R Rifampin                       S Tetracycline                   R Trimethoprim/Sulfa             S Vancomycin                      S   Blood culture (routine x 2)     Status: None   Collection Time: 07/24/24  4:09 PM   Specimen: BLOOD  Result Value Ref Range Status   Specimen Description BLOOD RIGHT ARM  Final   Special Requests   Final    BOTTLES DRAWN AEROBIC AND ANAEROBIC Blood Culture adequate  volume   Culture   Final    NO GROWTH 5 DAYS Performed at [facility], [address] [place], [place] [address]    Report Status 07/29/2024 FINAL  Final  Blood culture (routine x 2)     Status: None   Collection Time: 07/24/24 10:51 PM   Specimen: BLOOD  Result Value Ref Range Status   Specimen Description BLOOD LEFT ANTECUBITAL  Final   Special Requests   Final    BOTTLES DRAWN AEROBIC AND ANAEROBIC Blood Culture adequate volume   Culture   Final    NO GROWTH 5 DAYS Performed at [facility], [address] [place], [place] [address]    Report Status 07/30/2024 FINAL  Final  Aerobic/Anaerobic Culture w Gram Stain (surgical/deep wound)     Status: None (Preliminary result)   Collection Time: 07/29/24  1:09 PM   Specimen: Wound  Result Value Ref Range Status   Specimen Description   Final    WOUND Performed at  [facility], [address], [place], [place] [address]    Special Requests   Final    LT GROIN Performed at [facility], [address] [place], [place] [address]    Gram Stain   Final    FEW WBC PRESENT, PREDOMINANTLY MONONUCLEAR NO ORGANISMS SEEN    Culture   Final    NO GROWTH < 12 HOURS Performed at [facility], [address], [place], [place] [address]    Report Status PENDING  Incomplete     Labs: Basic Metabolic Panel: Recent Labs  Lab 07/25/24 0556 07/26/24 0457 07/27/24 0553 07/28/24 0623 07/29/24 0613  NA 138 140 139 140 140  K 3.6 3.8 4.2 4.5 4.0  CL 103 106 104 106 108  CO2 24 25 26 26 25   GLUCOSE 91 86 91 107* 83  BUN 27* 22 18 20 18   CREATININE 1.10* 1.04* 0.94 1.15* 0.93  CALCIUM  9.0 9.2 9.6 9.3 9.0   Liver Function Tests: Recent Labs  Lab 07/24/24 1211 07/25/24 0556  AST 46* 40  ALT 45* 41  ALKPHOS 259* 225*  BILITOT 0.7 0.3  PROT 7.4 6.0*  ALBUMIN 3.8 3.1*   No results for input(s): LIPASE, AMYLASE in the last 168 hours. No results for input(s): AMMONIA in the last 168 hours. CBC: Recent Labs  Lab 07/24/24 1211 07/25/24 0556 07/27/24 0553 07/28/24 0623 07/29/24 0611 07/29/24 0613 07/30/24 0309  WBC 17.6*   < > 10.8* 10.9* 13.4* 13.5* 12.7*  NEUTROABS 14.9*  --   --   --  8.2*  --   --   HGB 11.4*   < > 10.0* 9.5* 9.3* 9.3* 10.0*  HCT 35.5*   < > 31.7* 30.1* 30.1* 30.4* 32.4*  MCV 90.6   < > 91.1 90.7 92.6 93.3 92.3  PLT 269   < > 298 272 325 317 359   < > = values in this interval not displayed.   Cardiac Enzymes: No results for input(s): CKTOTAL, CKMB, CKMBINDEX, TROPONINI in the last 168 hours. BNP: BNP (last 3 results) No results for input(s): BNP in the last 8760 hours.  ProBNP (last 3 results) No results for input(s): PROBNP in the last 8760 hours.  CBG: No results for input(s): GLUCAP in the last 168 hours.     Signed:  Devaughn KATHEE Ban MD.  [facility] 07/30/2024, 4:19 PM     [1]  Allergies Allergen Reactions   Calcium  Channel  Blockers Shortness Of Breath, Anxiety, Palpitations and Other (See Comments)    Cognitive effects also--difficulty thinking   Hydrochlorothiazide Shortness Of Breath   Sodium Metabisulfite Anaphylaxis    Sulfite--in foods   Sulfites Swelling    Can't breathe   Telmisartan-Hctz Shortness Of Breath    Chest pain & fatigue    Cardizem [Diltiazem Hcl] Other (See Comments)    Difficulty eating, sleeping, and concentrating Heart goes crazy    Sulfa Antibiotics Hives   Acyclovir And Related    Apixaban  Nausea Only   Plavix  [Clopidogrel ]    Lexapro [Escitalopram Oxalate] Other (See Comments)    Gi upset

## 2024-07-30 NOTE — Progress Notes (Signed)
 Pharmacy - Patient education (linezolid )  Handouts provided.   Reviewed how to take linezolid  and side effects to be aware of and when to seek medical attention.  Also included handout on foods that may interact.  She is aware of follow-up with Infectious diseases physician  Celestine Slovak, PharmD, BCPS, BCIDP Work Cell: [phone] 07/30/2024 4:39 PM

## 2024-07-30 NOTE — Plan of Care (Signed)

## 2024-08-03 LAB — AEROBIC/ANAEROBIC CULTURE W GRAM STAIN (SURGICAL/DEEP WOUND): Culture: NO GROWTH

## 2024-08-07 ENCOUNTER — Ambulatory Visit: Admitting: Infectious Diseases
